# Patient Record
Sex: Male | Born: 1935 | Race: White | Hispanic: No | Smoking: Former smoker
Health system: Southern US, Community
[De-identification: ages and names within clinical notes are randomized; demographics above are authoritative.]

## PROBLEM LIST (undated history)

## (undated) DIAGNOSIS — K219 Gastro-esophageal reflux disease without esophagitis: Secondary | ICD-10-CM

## (undated) DIAGNOSIS — C3490 Malignant neoplasm of unspecified part of unspecified bronchus or lung: Secondary | ICD-10-CM

## (undated) DIAGNOSIS — F0390 Unspecified dementia without behavioral disturbance: Secondary | ICD-10-CM

## (undated) DIAGNOSIS — C801 Malignant (primary) neoplasm, unspecified: Secondary | ICD-10-CM

## (undated) HISTORY — DX: Gastro-esophageal reflux disease without esophagitis: K21.9

## (undated) HISTORY — PX: LUNG SURGERY: SHX703

## (undated) HISTORY — PX: OTHER SURGICAL HISTORY: SHX169

## (undated) HISTORY — PX: CHOLECYSTECTOMY: SHX55

---

## 2016-01-31 DIAGNOSIS — J208 Acute bronchitis due to other specified organisms: Secondary | ICD-10-CM | POA: Insufficient documentation

## 2016-02-05 DIAGNOSIS — R1312 Dysphagia, oropharyngeal phase: Secondary | ICD-10-CM | POA: Insufficient documentation

## 2016-02-05 DIAGNOSIS — K224 Dyskinesia of esophagus: Secondary | ICD-10-CM | POA: Insufficient documentation

## 2019-05-25 ENCOUNTER — Encounter: Payer: Self-pay | Admitting: Emergency Medicine

## 2019-05-25 ENCOUNTER — Other Ambulatory Visit: Payer: Self-pay

## 2019-05-25 ENCOUNTER — Ambulatory Visit
Admission: EM | Admit: 2019-05-25 | Discharge: 2019-05-25 | Disposition: A | Discharge status: Home / Self Care | Payer: Medicare Other | Attending: Family Medicine | Admitting: Family Medicine

## 2019-05-25 DIAGNOSIS — R21 Rash and other nonspecific skin eruption: Secondary | ICD-10-CM

## 2019-05-25 DIAGNOSIS — R600 Localized edema: Secondary | ICD-10-CM

## 2019-05-25 HISTORY — DX: Malignant (primary) neoplasm, unspecified: C80.1

## 2019-05-25 MED ORDER — BETAMETHASONE DIPROPIONATE 0.05 % EX OINT: TOPICAL_OINTMENT | Freq: Two times a day (BID) | CUTANEOUS | 0 refills | Status: DC

## 2019-05-25 NOTE — ED Provider Notes (Signed)
[facility]    CSN: [id number] Arrival date & time: 05/25/19  1233      History   Chief Complaint Chief Complaint  Patient presents with  . Rash  . Leg Swelling    bilateral   HPI  86 year old male presents with rash.  Patient reports a 7 to 9-day history of rash on his lower extremities.  His daughter is concerned that he has a contact dermatitis or chemical burn.  She states that he recently used some shampoo and conditioner and used it on his legs as you would lotion.  He left it on for hours.  Daughter feels like that this is the inciting factor.  He has had lower extremity edema, erythema, and rash.  Patient states that it is quite itchy and also burns.  He rates his pain as 5/10 in severity.  He has had some improvement with over-the-counter hydrocortisone.  He has also been applying Eucerin with some improvement.  No other associated symptoms.  No other complaints.  PMH: BPH (benign prostatic hyperplasia)  treated at [facility] hosp  GERD (gastroesophageal reflux disease)    Allergic state    Anemia    COPD (chronic obstructive pulmonary disease) (CMS-HCC)    Back pain  treated with gabapentin-[facility] with [facility]gen  Hydrocele  left testiclar-had repair  HOH (hard of hearing)  wears hearing aid in right ear    Surgical Hx: CHOLECYSTECTOMY      ARTHROSCOPIC ROTATOR CUFF REPAIR  Left left rotator cuff   INGUINAL HERNIA REPAIR      CATARACT EXTRACTION   both eyes   ear surgery       Home Medications    Prior to Admission medications   Medication Sig Start Date End Date Taking? Authorizing Provider  budesonide-formoterol (SYMBICORT) 160-4.5 MCG/ACT inhaler Inhale into the lungs.   Yes [provider]  finasteride (PROSCAR) 5 MG tablet Take by mouth.   Yes [provider]  gabapentin (NEURONTIN) 300 MG capsule Take by mouth.   Yes [provider]  omeprazole (PRILOSEC) 20 MG capsule Take by mouth.    Yes [provider]  tamsulosin (FLOMAX) 0.4 MG CAPS capsule Take 0.4 mg by mouth.   Yes [provider]  betamethasone dipropionate (DIPROLENE) 0.05 % ointment Apply topically 2 (two) times daily. Do not use for more than 2 weeks consecutively. 05/25/19   Coral Spikes, DO   Social History Social History   Tobacco Use  . Smoking status: Former Research scientist (life sciences)  . Smokeless tobacco: Never Used  Substance Use Topics  . Alcohol use: Never  . Drug use: Never     Allergies   Patient has no known allergies.   Review of Systems Review of Systems  Constitutional: Negative.   Skin: Positive for rash.   Physical Exam Triage Vital Signs ED Triage Vitals  Enc Vitals Group     BP 05/25/19 1305 (!) 154/86     Pulse Rate 05/25/19 1305 89     Resp 05/25/19 1305 16     Temp 05/25/19 1305 98 F (36.7 C)     Temp Source 05/25/19 1305 Oral     SpO2 05/25/19 1305 98 %     Weight 05/25/19 1255 165 lb (74.8 kg)     Height 05/25/19 1255 5\' 10"  (1.778 m)     Head Circumference --      Peak Flow --      Pain Score 05/25/19 1255 5  Pain Loc --      Pain Edu? --      Excl. in [place]? --    Updated Vital Signs BP (!) 154/86 (BP Location: Right Arm)   Pulse 89   Temp 98 F (36.7 C) (Oral)   Resp 16   Ht 5\' 10"  (1.778 m)   Wt 74.8 kg   SpO2 98%   BMI 23.68 kg/m   Visual Acuity Right Eye Distance:   Left Eye Distance:   Bilateral Distance:    Right Eye Near:   Left Eye Near:    Bilateral Near:     Physical Exam Vitals and nursing note reviewed.  Constitutional:      General: He is not in acute distress.    Appearance: Normal appearance. He is not ill-appearing.  HENT:     Head: Normocephalic and atraumatic.  Eyes:     General:        Right eye: No discharge.        Left eye: No discharge.     Conjunctiva/sclera: Conjunctivae normal.  Pulmonary:     Effort: Pulmonary effort is normal. No respiratory distress.  Skin:    Comments: Bilateral lower extremities with  1+ edema.  Dryness and erythema noted.  He has areas of excoriation.  Neurological:     Mental Status: He is alert.  Psychiatric:        Mood and Affect: Mood normal.        Behavior: Behavior normal.    UC Treatments / Results  Labs (all labs ordered are listed, but only abnormal results are displayed) Labs Reviewed - No data to display  EKG   Radiology No results found.  Procedures Procedures (including critical care time)  Medications Ordered in UC Medications - No data to display  Initial Impression / Assessment and Plan / UC Course  I have reviewed the triage vital signs and the nursing notes.  Pertinent labs & imaging results that were available during my care of the patient were reviewed by me and considered in my medical decision making (see chart for details).    86 year old male presents with rash.  Contact dermatitis versus chemical reaction.  Treating with betamethasone.  Advised elevation of his legs.  It is apparent the patient has underlying venous insufficiency as well.  Final Clinical Impressions(s) / UC Diagnoses   Final diagnoses:  Rash and nonspecific skin eruption  Lower extremity edema     Discharge Instructions     Elevate legs.  Steroid as prescribed.  Take care  Dr. Lacinda Axon    ED Prescriptions    Medication Sig Dispense Auth. Provider   betamethasone dipropionate (DIPROLENE) 0.05 % ointment Apply topically 2 (two) times daily. Do not use for more than 2 weeks consecutively. 50 g Coral Spikes, DO     PDMP not reviewed this encounter.   Coral Spikes, [place] 05/25/19 1657

## 2019-05-25 NOTE — ED Triage Notes (Signed)
Patient and daughter states that he has had red itchy rash on his legs and swelling in his legs that started 9 days ago.  Patient thinks it is from the weeds and grass.  Daughter thinks it might be from the shampoo.

## 2019-05-25 NOTE — Discharge Instructions (Signed)
Elevate legs.  Steroid as prescribed.  Take care  Dr. Lacinda Axon

## 2019-06-01 ENCOUNTER — Other Ambulatory Visit: Payer: Self-pay

## 2019-06-01 ENCOUNTER — Ambulatory Visit
Admission: EM | Admit: 2019-06-01 | Discharge: 2019-06-01 | Disposition: A | Discharge status: Home / Self Care | Payer: Medicare Other | Attending: Family Medicine | Admitting: Family Medicine

## 2019-06-01 ENCOUNTER — Encounter: Payer: Self-pay | Admitting: Emergency Medicine

## 2019-06-01 DIAGNOSIS — R21 Rash and other nonspecific skin eruption: Secondary | ICD-10-CM

## 2019-06-01 HISTORY — DX: Unspecified dementia, unspecified severity, without behavioral disturbance, psychotic disturbance, mood disturbance, and anxiety: F03.90

## 2019-06-01 HISTORY — DX: Malignant neoplasm of unspecified part of unspecified bronchus or lung: C34.90

## 2019-06-01 MED ORDER — PREDNISONE 10 MG PO TABS: ORAL_TABLET | ORAL | 0 refills | Status: DC

## 2019-06-01 NOTE — Discharge Instructions (Addendum)
Medication as directed.  See Dermatology. You are welcome to try the [place] but it may take some time. I recommend [facility] dermatology - [phone].  Best of luck.  Dr. Lacinda Axon

## 2019-06-01 NOTE — ED Triage Notes (Signed)
Patient in today with his son who states that he noticed a rash on his back x 3 days. Patient has used Betamethasone and hydrocortisone.

## 2019-06-01 NOTE — ED Provider Notes (Signed)
[facility]    CSN: [id number] Arrival date & time: 06/01/19  1415      History   Chief Complaint Chief Complaint  Patient presents with  . Rash   HPI  86 year old male presents with persistent and worsening rash.  Patient recently seen on 2/27.  At that time had papular rash on his lower extremities.  Has not had resolution with topical betamethasone.  Rash has now spread.  Now is on the back as well.  Diffuse.  Very itchy.  Burns.  Patient is very displeased with the fact that his rash has not resolved.  No known inciting factor.  No new exposures.  No other complaints at this time.  Past Medical History:  Diagnosis Date  . Cancer ([place])   . Dementia ([place])   . Lung cancer [facility])    Past Surgical History:  Procedure Laterality Date  . CHOLECYSTECTOMY    . LUNG SURGERY Left   . rotator cuff surgery     Home Medications    Prior to Admission medications   Medication Sig Start Date End Date Taking? Authorizing Provider  budesonide-formoterol (SYMBICORT) 160-4.5 MCG/ACT inhaler Inhale into the lungs.   Yes [provider]  finasteride (PROSCAR) 5 MG tablet Take by mouth.   Yes [provider]  gabapentin (NEURONTIN) 300 MG capsule Take by mouth.   Yes [provider]  omeprazole (PRILOSEC) 20 MG capsule Take by mouth.   Yes [provider]  tamsulosin (FLOMAX) 0.4 MG CAPS capsule Take 0.4 mg by mouth.   Yes [provider]  VITAMIN D, CHOLECALCIFEROL, PO Take 1 tablet by mouth daily.   Yes [provider]  predniSONE (DELTASONE) 10 MG tablet 50 mg daily x 2 days, then 40 mg daily x 2 days, then 30 mg daily x 2 days, then 20 mg daily x 2 days, then 10 mg daily x 2 days. 06/01/19   Coral Spikes, DO    Family History Family History  Family history unknown: Yes    Social History Social History   Tobacco Use  . Smoking status: Former Smoker    Quit date: 06/01/1979    Years since quitting: 40.0  . Smokeless  tobacco: Never Used  Substance Use Topics  . Alcohol use: Never  . Drug use: Never     Allergies   Patient has no known allergies.   Review of Systems Review of Systems  Constitutional: Negative.   Skin: Positive for rash.   Physical Exam Triage Vital Signs ED Triage Vitals  Enc Vitals Group     BP 06/01/19 1428 131/75     Pulse Rate 06/01/19 1428 87     Resp 06/01/19 1428 18     Temp 06/01/19 1428 98.4 F (36.9 C)     Temp Source 06/01/19 1428 Oral     SpO2 06/01/19 1428 96 %     Weight 06/01/19 1430 165 lb (74.8 kg)     Height 06/01/19 1430 5\' 10"  (1.778 m)     Head Circumference --      Peak Flow --      Pain Score 06/01/19 1429 0     Pain Loc --      Pain Edu? --      Excl. in [place]? --    Updated Vital Signs BP 131/75 (BP Location: Left Arm)   Pulse 87   Temp 98.4 F (36.9 C) (Oral)   Resp 18   Ht 5\' 10"  (  1.778 m)   Wt 74.8 kg   SpO2 96%   BMI 23.68 kg/m   Visual Acuity Right Eye Distance:   Left Eye Distance:   Bilateral Distance:    Right Eye Near:   Left Eye Near:    Bilateral Near:     Physical Exam Vitals and nursing note reviewed.  Constitutional:      General: He is not in acute distress.    Appearance: Normal appearance.  HENT:     Head: Normocephalic and atraumatic.  Eyes:     General:        Right eye: No discharge.        Left eye: No discharge.     Conjunctiva/sclera: Conjunctivae normal.  Cardiovascular:     Rate and Rhythm: Rhythm irregularly irregular.  Pulmonary:     Effort: Pulmonary effort is normal. No respiratory distress.  Skin:    Comments: Lower extremities with erythematous, papular rash.  Back with diffuse, erythematous papular rash.  Some lesions have eschar.  Evidence of excoriation.  Neurological:     Mental Status: He is alert.  Psychiatric:        Mood and Affect: Mood normal.        Behavior: Behavior normal.     UC Treatments / Results  Labs (all labs ordered are listed, but only abnormal results  are displayed) Labs Reviewed - No data to display  EKG   Radiology No results found.  Procedures Procedures (including critical care time)  Medications Ordered in UC Medications - No data to display  Initial Impression / Assessment and Plan / UC Course  I have reviewed the triage vital signs and the nursing notes.  Pertinent labs & imaging results that were available during my care of the patient were reviewed by me and considered in my medical decision making (see chart for details).    86 year old male presents with persistent and worsening rash.  Etiology uncertain.  Does not appear infectious.  Placing empirically on prednisone.  Advised to see dermatology.  May need biopsy.  Supportive care.  Final Clinical Impressions(s) / UC Diagnoses   Final diagnoses:  Rash     Discharge Instructions     Medication as directed.  See Dermatology. You are welcome to try the [place] but it may take some time. I recommend [facility] dermatology - [phone].  Best of luck.  Dr. Lacinda Axon    ED Prescriptions    Medication Sig Dispense Auth. Provider   predniSONE (DELTASONE) 10 MG tablet 50 mg daily x 2 days, then 40 mg daily x 2 days, then 30 mg daily x 2 days, then 20 mg daily x 2 days, then 10 mg daily x 2 days. 30 tablet Coral Spikes, DO     PDMP not reviewed this encounter.   Coral Spikes, [place] 06/01/19 1506

## 2019-07-10 ENCOUNTER — Emergency Department: Payer: Medicare Other

## 2019-07-10 ENCOUNTER — Encounter: Payer: Self-pay | Admitting: Emergency Medicine

## 2019-07-10 ENCOUNTER — Other Ambulatory Visit: Payer: Self-pay

## 2019-07-10 DIAGNOSIS — R0989 Other specified symptoms and signs involving the circulatory and respiratory systems: Secondary | ICD-10-CM | POA: Insufficient documentation

## 2019-07-10 DIAGNOSIS — Z5321 Procedure and treatment not carried out due to patient leaving prior to being seen by health care provider: Secondary | ICD-10-CM | POA: Diagnosis not present

## 2019-07-10 NOTE — ED Triage Notes (Signed)
Patient ambulatory to triage with steady gait, without difficulty or distress noted, mask in place; family reports pt choked on chicken sandwich tonight; st hx aspiration; able to eat & drink since but cont to clear throat

## 2019-07-11 ENCOUNTER — Emergency Department
Admission: EM | Admit: 2019-07-11 | Discharge: 2019-07-11 | Disposition: A | Discharge status: Home / Self Care | Payer: Medicare Other | Attending: Emergency Medicine | Admitting: Emergency Medicine

## 2019-07-11 NOTE — ED Notes (Signed)
Pt daughter to STAT desk asking about wait time. States she wants to take her dad home so he can lie down. Encouraged to stay. Verbalized understanding the benefit of staying to be seen.

## 2019-07-11 NOTE — ED Notes (Signed)
Spoke with pt daughter regarding pt wait time. Pt is unable to sit for long periods of time due to pain to his tailbone per his daughter. Pt offered a recliner chair but pt and family decline and states he needs to lie down on his side and can not sit at all. They inform this nurse that he needs to go home and lie down. Verbalized understanding of risks of leaving. Agreed to call PCP in the morning.

## 2019-11-06 DIAGNOSIS — Z0289 Encounter for other administrative examinations: Secondary | ICD-10-CM | POA: Insufficient documentation

## 2019-11-23 ENCOUNTER — Ambulatory Visit
Admission: EM | Admit: 2019-11-23 | Discharge: 2019-11-23 | Disposition: A | Discharge status: Home / Self Care | Payer: Medicare Other | Attending: Internal Medicine | Admitting: Internal Medicine

## 2019-11-23 ENCOUNTER — Other Ambulatory Visit: Payer: Self-pay

## 2019-11-23 DIAGNOSIS — R531 Weakness: Secondary | ICD-10-CM

## 2019-11-23 LAB — URINALYSIS, COMPLETE (UACMP) WITH MICROSCOPIC
Bacteria, UA: NONE SEEN
Glucose, UA: NEGATIVE mg/dL
Ketones, ur: 15 mg/dL — AB
Leukocytes,Ua: NEGATIVE
Nitrite: NEGATIVE
Protein, ur: NEGATIVE mg/dL
Specific Gravity, Urine: 1.02 (ref 1.005–1.030)
WBC, UA: NONE SEEN WBC/hpf (ref 0–5)
pH: 5 (ref 5.0–8.0)

## 2019-11-23 LAB — BASIC METABOLIC PANEL
Anion gap: 9 (ref 5–15)
BUN: 20 mg/dL (ref 8–23)
CO2: 24 mmol/L (ref 22–32)
Calcium: 9.2 mg/dL (ref 8.9–10.3)
Chloride: 102 mmol/L (ref 98–111)
Creatinine, Ser: 1.39 mg/dL — ABNORMAL HIGH (ref 0.61–1.24)
GFR calc Af Amer: 52 mL/min — ABNORMAL LOW (ref >60)
GFR calc non Af Amer: 45 mL/min — ABNORMAL LOW (ref >60)
Glucose, Bld: 121 mg/dL — ABNORMAL HIGH (ref 70–99)
Potassium: 4.1 mmol/L (ref 3.5–5.1)
Sodium: 135 mmol/L (ref 135–145)

## 2019-11-23 NOTE — ED Triage Notes (Addendum)
Patient here today w/ c/o fatigue, B/A, and dehydration. Patient's daughter states patient has dementia. Patient is under pain management as well. Sx onset x 2 wks. Patient also c/o chronic tailbone problem that is being addressed by his PCP and Pain Management.   Patient has hx of COVID in 02/2019.

## 2019-11-23 NOTE — ED Provider Notes (Signed)
[facility]    CSN: [id number] Arrival date & time: 11/23/19  1048      History   Chief Complaint Chief Complaint  Patient presents with  . Fatigue  . Generalized Body Aches  . Dehydration  . Anxiety    HPI Timothy Noble is a 86 y.o. male is brought to the urgent care accompanied by his daughter on account of 1 day history of generalized weakness.  As per the patient's daughter, patient has Alzheimer's dementia and lives independently.  They check on him in the evenings.  She could not ascertain if he has been drinking enough fluids during the day.  No vomiting or diarrhea.  No fever or chills.  Patient has chronic low back pain which is being managed by pain management team.  No cough or sputum production.Marland Kitchen   HPI  Past Medical History:  Diagnosis Date  . Cancer ([place])   . Dementia ([place])   . Lung cancer ([place])     There are no problems to display for this patient.   Past Surgical History:  Procedure Laterality Date  . CHOLECYSTECTOMY    . LUNG SURGERY Left   . rotator cuff surgery         Home Medications    Prior to Admission medications   Medication Sig Start Date End Date Taking? Authorizing Provider  budesonide-formoterol (SYMBICORT) 160-4.5 MCG/ACT inhaler Inhale into the lungs.   Yes [provider]  finasteride (PROSCAR) 5 MG tablet Take by mouth.   Yes [provider]  gabapentin (NEURONTIN) 300 MG capsule Take by mouth.   Yes [provider]  omeprazole (PRILOSEC) 20 MG capsule Take by mouth.   Yes [provider]  predniSONE (DELTASONE) 10 MG tablet 50 mg daily x 2 days, then 40 mg daily x 2 days, then 30 mg daily x 2 days, then 20 mg daily x 2 days, then 10 mg daily x 2 days. 06/01/19  Yes Cook, Jayce G, DO  tamsulosin (FLOMAX) 0.4 MG CAPS capsule Take 0.4 mg by mouth.   Yes [provider]  VITAMIN D, CHOLECALCIFEROL, PO Take 1 tablet by mouth daily.   Yes [provider]  acetaminophen  (TYLENOL) 500 MG tablet Take by mouth.    [provider]  buprenorphine (BUTRANS) 10 MCG/HR PTWK 1 patch once a week. 11/06/19   [provider]  cetirizine (ZYRTEC) 10 MG tablet Take by mouth.    [provider]    Family History Family History  Family history unknown: Yes    Social History Social History   Tobacco Use  . Smoking status: Former Smoker    Quit date: 06/01/1979    Years since quitting: 40.5  . Smokeless tobacco: Never Used  Vaping Use  . Vaping Use: Never used  Substance Use Topics  . Alcohol use: Never  . Drug use: Never     Allergies   Patient has no known allergies.   Review of Systems Review of Systems  Unable to perform ROS: Dementia     Physical Exam Triage Vital Signs ED Triage Vitals  Enc Vitals Group     BP 11/23/19 1141 130/90     Pulse Rate 11/23/19 1141 89     Resp 11/23/19 1141 20     Temp 11/23/19 1141 97.9 F (36.6 C)     Temp Source 11/23/19 1141 Oral     SpO2 11/23/19 1141 98 %     Weight 11/23/19 1144 160 lb (  72.6 kg)     Height 11/23/19 1144 5\' 9"  (1.753 m)     Head Circumference --      Peak Flow --      Pain Score 11/23/19 1143 4     Pain Loc --      Pain Edu? --      Excl. in [place]? --    No data found.  Updated Vital Signs BP 130/90 (BP Location: Right Arm)   Pulse 89   Temp 97.9 F (36.6 C) (Oral)   Resp 20   Ht 5\' 9"  (1.753 m)   Wt 72.6 kg   SpO2 98%   BMI 23.63 kg/m   Visual Acuity Right Eye Distance:   Left Eye Distance:   Bilateral Distance:    Right Eye Near:   Left Eye Near:    Bilateral Near:     Physical Exam Vitals and nursing note reviewed.  Constitutional:      Appearance: He is not ill-appearing.  Cardiovascular:     Rate and Rhythm: Regular rhythm.     Pulses: Normal pulses.     Heart sounds: Normal heart sounds.  Pulmonary:     Effort: Pulmonary effort is normal.     Breath sounds: Normal breath sounds.  Musculoskeletal:        General: Normal range of  motion.  Skin:    Capillary Refill: Capillary refill takes less than 2 seconds.  Neurological:     Mental Status: He is alert.      UC Treatments / Results  Labs (all labs ordered are listed, but only abnormal results are displayed) Labs Reviewed  URINALYSIS, COMPLETE (UACMP) WITH MICROSCOPIC - Abnormal; Notable for the following components:      Result Value   Hgb urine dipstick TRACE (*)    Bilirubin Urine SMALL (*)    Ketones, ur 15 (*)    All other components within normal limits  BASIC METABOLIC PANEL    EKG   Radiology No results found.  Procedures Procedures (including critical care time)  Medications Ordered in UC Medications - No data to display  Initial Impression / Assessment and Plan / UC Course  I have reviewed the triage vital signs and the nursing notes.  Pertinent labs & imaging results that were available during my care of the patient were reviewed by me and considered in my medical decision making (see chart for details).     1.  Generalized weakness: Urinalysis is negative for UTI Specific gravity is 1.020 BMP ordered Patient is advised to increase oral fluid intake The patient's family may need to support him with strategies that will help maintain his hydration. Return precautions given If patient symptoms worsens he needs to be taken to the emergency department for further evaluation. Final Clinical Impressions(s) / UC Diagnoses   Final diagnoses:  Weakness     Discharge Instructions     Please encourage Derrious to drink more fluids If his blood work is significant and requires a change in his plan of care-we will call you with the results I would encourage you to sign up for my chart to review the results Return to urgent care if his symptoms worsen.   ED Prescriptions    None     PDMP not reviewed this encounter.   Chase Picket, MD 11/23/19 1247

## 2019-11-23 NOTE — Discharge Instructions (Signed)
Please encourage Timothy Noble to drink more fluids If his blood work is significant and requires a change in his plan of care-we will call you with the results I would encourage you to sign up for my chart to review the results Return to urgent care if his symptoms worsen.

## 2020-02-17 ENCOUNTER — Encounter: Payer: Self-pay | Admitting: Podiatry

## 2020-02-17 ENCOUNTER — Other Ambulatory Visit: Payer: Self-pay

## 2020-02-17 ENCOUNTER — Ambulatory Visit (INDEPENDENT_AMBULATORY_CARE_PROVIDER_SITE_OTHER): Payer: Medicare Other | Admitting: Podiatry

## 2020-02-17 DIAGNOSIS — N433 Hydrocele, unspecified: Secondary | ICD-10-CM | POA: Insufficient documentation

## 2020-02-17 DIAGNOSIS — N400 Enlarged prostate without lower urinary tract symptoms: Secondary | ICD-10-CM | POA: Insufficient documentation

## 2020-02-17 DIAGNOSIS — D649 Anemia, unspecified: Secondary | ICD-10-CM | POA: Insufficient documentation

## 2020-02-17 DIAGNOSIS — J432 Centrilobular emphysema: Secondary | ICD-10-CM | POA: Insufficient documentation

## 2020-02-17 DIAGNOSIS — T7840XA Allergy, unspecified, initial encounter: Secondary | ICD-10-CM | POA: Insufficient documentation

## 2020-02-17 DIAGNOSIS — J449 Chronic obstructive pulmonary disease, unspecified: Secondary | ICD-10-CM | POA: Insufficient documentation

## 2020-02-17 DIAGNOSIS — M79676 Pain in unspecified toe(s): Secondary | ICD-10-CM | POA: Diagnosis not present

## 2020-02-17 DIAGNOSIS — B351 Tinea unguium: Secondary | ICD-10-CM

## 2020-02-17 DIAGNOSIS — M549 Dorsalgia, unspecified: Secondary | ICD-10-CM | POA: Insufficient documentation

## 2020-02-17 DIAGNOSIS — H9190 Unspecified hearing loss, unspecified ear: Secondary | ICD-10-CM | POA: Insufficient documentation

## 2020-02-17 NOTE — Progress Notes (Signed)
  Subjective:  Patient ID: Timothy Noble, male    DOB: 11/04/1930,  MRN: [id number] HPI Chief Complaint  Patient presents with  . Debridement    Requesting nail care  . New Patient (Initial Visit)    86 y.o. male presents with the above complaint.   ROS: Denies fever chills nausea vomiting muscle aches pains calf pain back pain chest pain shortness of breath.  Past Medical History:  Diagnosis Date  . Cancer ([place])   . Dementia ([place])   . Lung cancer [facility])    Past Surgical History:  Procedure Laterality Date  . CHOLECYSTECTOMY    . LUNG SURGERY Left   . rotator cuff surgery      Current Outpatient Medications:  .  diclofenac Sodium (VOLTAREN) 1 % GEL, Apply topically., Disp: , Rfl:  .  acetaminophen (TYLENOL) 500 MG tablet, Take by mouth., Disp: , Rfl:  .  budesonide-formoterol (SYMBICORT) 160-4.5 MCG/ACT inhaler, Inhale into the lungs., Disp: , Rfl:  .  buprenorphine (BUTRANS) 5 MCG/HR PTWK, 1 patch once a week., Disp: , Rfl:  .  calcium-vitamin D (OSCAL WITH D) 500-200 MG-UNIT TABS tablet, Take by mouth., Disp: , Rfl:  .  cetirizine (ZYRTEC) 10 MG tablet, Take by mouth., Disp: , Rfl:  .  finasteride (PROSCAR) 5 MG tablet, Take by mouth., Disp: , Rfl:  .  fluticasone (FLONASE) 50 MCG/ACT nasal spray, 2 sprays by Each Nare route two (2) times a day as needed for rhinitis., Disp: , Rfl:  .  gabapentin (NEURONTIN) 300 MG capsule, Take by mouth., Disp: , Rfl:  .  omeprazole (PRILOSEC) 20 MG capsule, Take by mouth., Disp: , Rfl:  .  tamsulosin (FLOMAX) 0.4 MG CAPS capsule, Take 0.4 mg by mouth., Disp: , Rfl:  .  VITAMIN D, CHOLECALCIFEROL, PO, Take 1 tablet by mouth daily., Disp: , Rfl:   No Known Allergies Review of Systems Objective:  There were no vitals filed for this visit.  General: Well developed, nourished, in no acute distress, alert and oriented x3   Dermatological: Skin is warm, dry and supple bilateral. Nails x 10 are well maintained with exception of the hallux  bilaterally and the second digit left.  These nails appear to be thick dystrophic clinically mycotic.; remaining integument appears unremarkable at this time. There are no open sores, no preulcerative lesions, no rash or signs of infection present.  Vascular: Dorsalis Pedis artery and Posterior Tibial artery pedal pulses are 2/4 bilateral with immedate capillary fill time. Pedal hair growth present. No varicosities and no lower extremity edema present bilateral.   Neruologic: Grossly intact via light touch bilateral. Vibratory intact via tuning fork bilateral. Protective threshold with Semmes Wienstein monofilament intact to all pedal sites bilateral. Patellar and Achilles deep tendon reflexes 2+ bilateral. No Babinski or clonus noted bilateral.   Musculoskeletal: No gross boney pedal deformities bilateral. No pain, crepitus, or limitation noted with foot and ankle range of motion bilateral. Muscular strength 5/5 in all groups tested bilateral.  Gait: Unassisted, Nonantalgic.    Radiographs:  None taken  Assessment & Plan:   Assessment: Pain in limb secondary to onychomycosis.  Plan: Debridement of toenails 1 through 5 bilateral.  He will follow up with Dr. Prudence Davidson next visit     Garry Bochicchio T. [place], [place]

## 2020-02-24 ENCOUNTER — Ambulatory Visit: Payer: Self-pay

## 2020-06-16 ENCOUNTER — Ambulatory Visit (INDEPENDENT_AMBULATORY_CARE_PROVIDER_SITE_OTHER)
Admit: 2020-06-16 | Discharge: 2020-06-16 | Disposition: A | Discharge status: Home / Self Care | Payer: No Typology Code available for payment source | Attending: Emergency Medicine | Admitting: Emergency Medicine

## 2020-06-16 ENCOUNTER — Other Ambulatory Visit: Payer: Self-pay

## 2020-06-16 ENCOUNTER — Ambulatory Visit
Admission: EM | Admit: 2020-06-16 | Discharge: 2020-06-16 | Disposition: A | Discharge status: Home / Self Care | Payer: No Typology Code available for payment source | Attending: Emergency Medicine | Admitting: Emergency Medicine

## 2020-06-16 DIAGNOSIS — R300 Dysuria: Secondary | ICD-10-CM | POA: Diagnosis not present

## 2020-06-16 DIAGNOSIS — R109 Unspecified abdominal pain: Secondary | ICD-10-CM | POA: Diagnosis present

## 2020-06-16 DIAGNOSIS — R140 Abdominal distension (gaseous): Secondary | ICD-10-CM | POA: Diagnosis not present

## 2020-06-16 DIAGNOSIS — G309 Alzheimer's disease, unspecified: Secondary | ICD-10-CM | POA: Diagnosis not present

## 2020-06-16 DIAGNOSIS — R1084 Generalized abdominal pain: Secondary | ICD-10-CM | POA: Diagnosis not present

## 2020-06-16 LAB — CBC WITH DIFFERENTIAL/PLATELET
Abs Immature Granulocytes: 0.03 10*3/uL (ref 0.00–0.07)
Basophils Absolute: 0.1 10*3/uL (ref 0.0–0.1)
Basophils Relative: 1 %
Eosinophils Absolute: 0.6 10*3/uL — ABNORMAL HIGH (ref 0.0–0.5)
Eosinophils Relative: 6 %
HCT: 40.1 % (ref 39.0–52.0)
Hemoglobin: 13.5 g/dL (ref 13.0–17.0)
Immature Granulocytes: 0 %
Lymphocytes Relative: 20 %
Lymphs Abs: 1.9 10*3/uL (ref 0.7–4.0)
MCH: 31 pg (ref 26.0–34.0)
MCHC: 33.7 g/dL (ref 30.0–36.0)
MCV: 92.2 fL (ref 80.0–100.0)
Monocytes Absolute: 0.8 10*3/uL (ref 0.1–1.0)
Monocytes Relative: 8 %
Neutro Abs: 6.3 10*3/uL (ref 1.7–7.7)
Neutrophils Relative %: 65 %
Platelets: 181 10*3/uL (ref 150–400)
RBC: 4.35 MIL/uL (ref 4.22–5.81)
RDW: 12.7 % (ref 11.5–15.5)
WBC: 9.7 10*3/uL (ref 4.0–10.5)
nRBC: 0 % (ref 0.0–0.2)

## 2020-06-16 LAB — URINALYSIS, COMPLETE (UACMP) WITH MICROSCOPIC
Bacteria, UA: NONE SEEN
Bilirubin Urine: NEGATIVE
Glucose, UA: NEGATIVE mg/dL
Hgb urine dipstick: NEGATIVE
Leukocytes,Ua: NEGATIVE
Nitrite: NEGATIVE
Protein, ur: NEGATIVE mg/dL
RBC / HPF: NONE SEEN RBC/hpf (ref 0–5)
Specific Gravity, Urine: 1.02 (ref 1.005–1.030)
Squamous Epithelial / HPF: NONE SEEN (ref 0–5)
pH: 5.5 (ref 5.0–8.0)

## 2020-06-16 LAB — COMPREHENSIVE METABOLIC PANEL
ALT: 13 U/L (ref 0–44)
AST: 18 U/L (ref 15–41)
Albumin: 4.4 g/dL (ref 3.5–5.0)
Alkaline Phosphatase: 32 U/L — ABNORMAL LOW (ref 38–126)
Anion gap: 7 (ref 5–15)
BUN: 19 mg/dL (ref 8–23)
CO2: 24 mmol/L (ref 22–32)
Calcium: 9.5 mg/dL (ref 8.9–10.3)
Chloride: 106 mmol/L (ref 98–111)
Creatinine, Ser: 1.15 mg/dL (ref 0.61–1.24)
GFR, Estimated: >60 mL/min (ref >60)
Glucose, Bld: 96 mg/dL (ref 70–99)
Potassium: 4 mmol/L (ref 3.5–5.1)
Sodium: 137 mmol/L (ref 135–145)
Total Bilirubin: 1 mg/dL (ref 0.3–1.2)
Total Protein: 7.3 g/dL (ref 6.5–8.1)

## 2020-06-16 NOTE — ED Provider Notes (Signed)
[facility]    CSN: [id number] Arrival date & time: 06/16/20  1644      History   Chief Complaint Chief Complaint  Patient presents with  . Abdominal Pain    HPI Timothy Noble is a 87 y.o. male.   HPI   87 year old male here for evaluation of abdominal pain.  Patient is here with his daughter for evaluation of abdominal pain.  Patient has Alzheimer's and has a home health aide that comes every day from 10 AM to 6 PM.  Patient stated to the family that when she arrived today at 10 AM the patient was curled up in bed and complaining of abdominal pain.  Patient told caregiver and daughter that the cramping started last night and that he has had some associated nausea.  Today he has had a much decreased appetite.  Patient has not had any fever, vomiting, or diarrhea.  Patient has had some bloating per the daughter and also when asked complains of painful urination.  Patient has a history of BPH and is on Flomax and finasteride for that.  Patient's last BM was today and it was soft.  Daughter is unaware of any blood in his stool.  Past Medical History:  Diagnosis Date  . Cancer ([place])   . Dementia ([place])   . Lung cancer [facility])     Patient Active Problem List   Diagnosis Date Noted  . Allergy 02/17/2020  . Anemia 02/17/2020  . Back pain 02/17/2020  . BPH (benign prostatic hyperplasia) 02/17/2020  . COPD (chronic obstructive pulmonary disease) ([place]) 02/17/2020  . HOH (hard of hearing) 02/17/2020  . Hydrocele 02/17/2020  . Pain management contract agreement 11/06/2019  . Esophageal dysmotility 02/05/2016  . Oropharyngeal dysphagia 02/05/2016  . Acute bronchitis due to other specified organisms 01/31/2016    Past Surgical History:  Procedure Laterality Date  . CHOLECYSTECTOMY    . LUNG SURGERY Left   . rotator cuff surgery         Home Medications    Prior to Admission medications   Medication Sig Start Date End Date Taking? Authorizing Provider  diclofenac  Sodium (VOLTAREN) 1 % GEL APPLY 2 GRAMS TOPICALLY FOUR TIMES A DAY AS DIRECTED 11/25/19  Yes [provider]  guaifenesin (HUMIBID E) 400 MG TABS tablet Take 1 tablet by mouth every 6 (six) hours as needed. 04/16/20  Yes [provider]  lidocaine (LIDODERM) 5 % APPLY 1 PATCH TO SKIN EVERY DAY APPLY ONCE DAILY FOR UP TO 12 HOURS (12 HOURS ON THEN REMOVE FOR 12 HOURS) 03/07/20  Yes [provider]  acetaminophen (TYLENOL) 500 MG tablet Take by mouth.    [provider]  budesonide-formoterol (SYMBICORT) 160-4.5 MCG/ACT inhaler Inhale into the lungs.    [provider]  buprenorphine (BUTRANS) 5 MCG/HR PTWK 1 patch once a week. 11/18/19   [provider]  calcium-vitamin D (OSCAL WITH D) 500-200 MG-UNIT TABS tablet Take by mouth.    [provider]  cetirizine (ZYRTEC) 10 MG tablet Take by mouth.    [provider]  finasteride (PROSCAR) 5 MG tablet Take by mouth.    [provider]  fluticasone (FLONASE) 50 MCG/ACT nasal spray 2 sprays by Each Nare route two (2) times a day as needed for rhinitis.    [provider]  gabapentin (NEURONTIN) 300 MG capsule Take by mouth.    [provider]  omeprazole (PRILOSEC) 20 MG capsule Take by mouth.    [provider]  tamsulosin (FLOMAX) 0.4 MG CAPS capsule Take 0.4 mg by mouth.    [provider]  VITAMIN D, CHOLECALCIFEROL, PO Take 1 tablet by mouth daily.    [provider]    Family History Family History  Family history unknown: Yes    Social History Social History   Tobacco Use  . Smoking status: Former Smoker    Quit date: 06/01/1979    Years since quitting: 41.0  . Smokeless tobacco: Never Used  Vaping Use  . Vaping Use: Never used  Substance Use Topics  . Alcohol use: Never  . Drug use: Never     Allergies   Alendronate sodium   Review of Systems Review of Systems  Constitutional: Positive for appetite  change. Negative for activity change and fever.  Gastrointestinal: Positive for abdominal distention, abdominal pain and nausea. Negative for blood in stool, diarrhea and vomiting.  Genitourinary: Positive for dysuria.  Hematological: Negative.   Psychiatric/Behavioral: Negative.      Physical Exam Triage Vital Signs ED Triage Vitals  Enc Vitals Group     BP 06/16/20 1700 (!) 161/97     Pulse Rate 06/16/20 1700 88     Resp 06/16/20 1700 16     Temp 06/16/20 1700 98.3 F (36.8 C)     Temp Source 06/16/20 1700 Oral     SpO2 06/16/20 1700 97 %     Weight 06/16/20 1653 160 lb (72.6 kg)     Height --      Head Circumference --      Peak Flow --      Pain Score 06/16/20 1651 5     Pain Loc --      Pain Edu? --      Excl. in [place]? --    No data found.  Updated Vital Signs BP (!) 161/97 (BP Location: Right Arm)   Pulse 88   Temp 98.3 F (36.8 C) (Oral)   Resp 16   Wt 160 lb (72.6 kg)   SpO2 97%   BMI 23.63 kg/m   Visual Acuity Right Eye Distance:   Left Eye Distance:   Bilateral Distance:    Right Eye Near:   Left Eye Near:    Bilateral Near:     Physical Exam Vitals and nursing note reviewed.  Constitutional:      General: He is not in acute distress.    Appearance: He is well-developed.  HENT:     Head: Normocephalic and atraumatic.  Cardiovascular:     Rate and Rhythm: Normal rate and regular rhythm.     Heart sounds: Normal heart sounds. No murmur heard. No gallop.   Pulmonary:     Effort: Pulmonary effort is normal.     Breath sounds: Normal breath sounds. No wheezing, rhonchi or rales.  Abdominal:     General: Bowel sounds are decreased. There is distension.     Palpations: Abdomen is rigid. There is no hepatomegaly or splenomegaly.  Skin:    General: Skin is warm and dry.  Neurological:     Mental Status: He is alert. He is disoriented.      UC Treatments / Results  Labs (all labs ordered are listed, but only abnormal results are  displayed) Labs Reviewed  CBC WITH DIFFERENTIAL/PLATELET - Abnormal; Notable for the following components:      Result Value   Eosinophils Absolute 0.6 (*)    All other components within normal limits  COMPREHENSIVE METABOLIC PANEL -  Abnormal; Notable for the following components:   Alkaline Phosphatase 32 (*)    All other components within normal limits  URINALYSIS, COMPLETE (UACMP) WITH MICROSCOPIC - Abnormal; Notable for the following components:   Ketones, ur TRACE (*)    All other components within normal limits    EKG   Radiology CT ABDOMEN PELVIS WO CONTRAST  Result Date: 06/16/2020 CLINICAL DATA:  Abdominal pain and cramping beginning yesterday. EXAM: CT ABDOMEN AND PELVIS WITHOUT CONTRAST TECHNIQUE: Multidetector CT imaging of the abdomen and pelvis was performed following the standard protocol without IV contrast. COMPARISON:  None. FINDINGS: Lower chest: No acute findings. Hepatobiliary: No mass visualized on this unenhanced exam. Prior cholecystectomy. No evidence of biliary obstruction. Pancreas: No mass or inflammatory process visualized on this unenhanced exam. Spleen:  Within normal limits in size. Adrenals/Urinary tract: No evidence of urolithiasis or hydronephrosis. Diffuse bladder wall thickening is seen, likely due to chronic bladder outlet obstruction given enlarged prostate. Stomach/Bowel: No evidence of obstruction, inflammatory process, or abnormal fluid collections. Diverticulosis is seen mainly involving the sigmoid colon, however there is no evidence of diverticulitis. Vascular/Lymphatic: No pathologically enlarged lymph nodes identified. No evidence of abdominal aortic aneurysm. Aortic atherosclerotic calcification noted. Reproductive:  Moderately enlarged prostate. Other:  None. Musculoskeletal:  No suspicious bone lesions identified. IMPRESSION: No evidence of urolithiasis, hydronephrosis, or other acute findings. Moderately enlarged prostate and findings of chronic  bladder outlet obstruction. Sigmoid diverticulosis, without radiographic evidence of diverticulitis. Aortic Atherosclerosis (ICD10-I70.0). Electronically Signed   By: Marlaine Hind M.D.   On: 06/16/2020 18:29    Procedures Procedures (including critical care time)  Medications Ordered in UC Medications - No data to display  Initial Impression / Assessment and Plan / UC Course  I have reviewed the triage vital signs and the nursing notes.  Pertinent labs & imaging results that were available during my care of the patient were reviewed by me and considered in my medical decision making (see chart for details).   Patient is an 87 year old male with a history of Alzheimer's here for evaluation of abdominal pain.  Patient physical exam patient is nontoxic appearing but does look like he does not feel well.  He is complaining of nausea and abdominal pain while rubbing his upper abdomen back and forth.  Patient is cooperative for exam.  Bowel sounds are diminished in all fields.  Abdomen is firm with a palpable knot in the right upper quadrant.  Unable to elicit tenderness, guarding, or rebound with palpation of the remainder of the abdomen.  Will obtain CBC, CMP, UA, and CT scan of the abdomen and pelvis.  CBC is unremarkable.  CMP shows a mildly low alk phos at 32 but is otherwise unremarkable.  CT scan is positive for diverticulosis without any evidence of diverticulitis.  No acute intra-abdominal process identified.  Urinalysis has trace ketones present but is otherwise unremarkable.  We will discharge patient home and have him follow-up with his primary care provider.  I cannot find any reason for his current abdominal pain.  We will have daughter treat pain with Tylenol as needed.  If patient's pain worsens or if he develops a fever patient is to follow-up in the emergency department.     Final Clinical Impressions(s) / UC Diagnoses   Final diagnoses:  Abdominal pain, unspecified  abdominal location     Discharge Instructions     The scan today did not show any attributable cause to your abdominal pain.  Your blood work did  not show any signs of infection and neither did your urine.  There are no signs of constipation on your imaging.  Use over-the-counter Tylenol as needed for pain.  There are no dietary restrictions.  If you develop a fever or if your pain intensifies go to the ER for evaluation.    ED Prescriptions    None     PDMP not reviewed this encounter.   Margarette Canada, NP 06/16/20 1929

## 2020-06-16 NOTE — ED Triage Notes (Addendum)
Patient presents to Urgent Care with complaints of abdominal cramping since last night. Daughter reports no changes in diet or meds. Pt states no hx of GI issues and last BM today.   Denies fever, n/v, diarrhea, or urinary symptoms.

## 2020-06-16 NOTE — Discharge Instructions (Addendum)
The scan today did not show any attributable cause to your abdominal pain.  Your blood work did not show any signs of infection and neither did your urine.  There are no signs of constipation on your imaging.  Use over-the-counter Tylenol as needed for pain.  There are no dietary restrictions.  If you develop a fever or if your pain intensifies go to the ER for evaluation.

## 2020-06-18 ENCOUNTER — Other Ambulatory Visit: Payer: Non-veteran care

## 2020-08-18 ENCOUNTER — Other Ambulatory Visit: Payer: Self-pay

## 2020-08-18 ENCOUNTER — Ambulatory Visit
Admission: EM | Admit: 2020-08-18 | Discharge: 2020-08-18 | Disposition: A | Discharge status: Home / Self Care | Payer: Medicare Other | Attending: Family Medicine | Admitting: Family Medicine

## 2020-08-18 DIAGNOSIS — R82998 Other abnormal findings in urine: Secondary | ICD-10-CM

## 2020-08-18 LAB — URINALYSIS, COMPLETE (UACMP) WITH MICROSCOPIC
Glucose, UA: NEGATIVE mg/dL
Hgb urine dipstick: NEGATIVE
Leukocytes,Ua: NEGATIVE
Nitrite: NEGATIVE
Protein, ur: NEGATIVE mg/dL
Specific Gravity, Urine: 1.025 (ref 1.005–1.030)
pH: 6 (ref 5.0–8.0)

## 2020-08-18 NOTE — ED Triage Notes (Signed)
Pt daughter states his nurse states he needs a urinalysis due to being more aggravated then usual.

## 2020-08-18 NOTE — ED Provider Notes (Signed)
[facility]    CSN: [id number] Arrival date & time: 08/18/20  1522      History   Chief Complaint Urine issue  HPI  87 year old male presents for urine testing.  Patient is accompanied by his daughter.  Daughter reports that he is full-time caregiver told her that he needed to come in and get his urine checked.  Daughter would not tell me exactly the reason behind this.  She states that his urine has been dark and she feels he is dehydrated.  There are no reports of dysuria.  No reports of foul-smelling urine.  He has no fever.  Past Medical History:  Diagnosis Date  . Cancer ([place])   . Dementia ([place])   . Lung cancer [facility])     Patient Active Problem List   Diagnosis Date Noted  . Allergy 02/17/2020  . Anemia 02/17/2020  . Back pain 02/17/2020  . BPH (benign prostatic hyperplasia) 02/17/2020  . COPD (chronic obstructive pulmonary disease) ([place]) 02/17/2020  . HOH (hard of hearing) 02/17/2020  . Hydrocele 02/17/2020  . Pain management contract agreement 11/06/2019  . Esophageal dysmotility 02/05/2016  . Oropharyngeal dysphagia 02/05/2016  . Acute bronchitis due to other specified organisms 01/31/2016    Past Surgical History:  Procedure Laterality Date  . CHOLECYSTECTOMY    . LUNG SURGERY Left   . rotator cuff surgery         Home Medications    Prior to Admission medications   Medication Sig Start Date End Date Taking? Authorizing Provider  acetaminophen (TYLENOL) 500 MG tablet Take by mouth.   Yes [provider]  budesonide-formoterol (SYMBICORT) 160-4.5 MCG/ACT inhaler Inhale into the lungs.   Yes [provider]  buprenorphine (BUTRANS) 5 MCG/HR PTWK 1 patch once a week. 11/18/19  Yes [provider]  calcium-vitamin D (OSCAL WITH D) 500-200 MG-UNIT TABS tablet Take by mouth.   Yes [provider]  cetirizine (ZYRTEC) 10 MG tablet Take by mouth.   Yes [provider]  diclofenac Sodium (VOLTAREN) 1 %  GEL APPLY 2 GRAMS TOPICALLY FOUR TIMES A DAY AS DIRECTED 11/25/19  Yes [provider]  finasteride (PROSCAR) 5 MG tablet Take by mouth.   Yes [provider]  fluticasone (FLONASE) 50 MCG/ACT nasal spray 2 sprays by Each Nare route two (2) times a day as needed for rhinitis.   Yes [provider]  gabapentin (NEURONTIN) 300 MG capsule Take by mouth.   Yes [provider]  guaifenesin (HUMIBID E) 400 MG TABS tablet Take 1 tablet by mouth every 6 (six) hours as needed. 04/16/20  Yes [provider]  lidocaine (LIDODERM) 5 % APPLY 1 PATCH TO SKIN EVERY DAY APPLY ONCE DAILY FOR UP TO 12 HOURS (12 HOURS ON THEN REMOVE FOR 12 HOURS) 03/07/20  Yes [provider]  omeprazole (PRILOSEC) 20 MG capsule Take by mouth.   Yes [provider]  tamsulosin (FLOMAX) 0.4 MG CAPS capsule Take 0.4 mg by mouth.   Yes [provider]  VITAMIN D, CHOLECALCIFEROL, PO Take 1 tablet by mouth daily.   Yes [provider]    Family History Family History  Family history unknown: Yes    Social History Social History   Tobacco Use  . Smoking status: Former Smoker    Quit date: 06/01/1979    Years since quitting: 41.2  . Smokeless tobacco: Never Used  Vaping Use  . Vaping Use: Never used  Substance Use Topics  .  Alcohol use: Never  . Drug use: Never     Allergies   Alendronate sodium   Review of Systems Review of Systems Per HPI  Physical Exam Triage Vital Signs ED Triage Vitals  Enc Vitals Group     BP 08/18/20 1530 (!) 172/89     Pulse Rate 08/18/20 1530 92     Resp 08/18/20 1530 20     Temp --      Temp src --      SpO2 08/18/20 1530 99 %     Weight --      Height --      Head Circumference --      Peak Flow --      Pain Score 08/18/20 1544 0     Pain Loc --      Pain Edu? --      Excl. in [place]? --    Updated Vital Signs BP (!) 172/89 (BP Location: Left Arm)   Pulse 92   Resp 20   SpO2 99%   Visual  Acuity Right Eye Distance:   Left Eye Distance:   Bilateral Distance:    Right Eye Near:   Left Eye Near:    Bilateral Near:     Physical Exam Vitals and nursing note reviewed.  Constitutional:      General: He is not in acute distress. HENT:     Head: Normocephalic and atraumatic.  Eyes:     General:        Right eye: No discharge.        Left eye: No discharge.     Conjunctiva/sclera: Conjunctivae normal.  Cardiovascular:     Rate and Rhythm: Normal rate and regular rhythm.  Pulmonary:     Effort: Pulmonary effort is normal.     Breath sounds: Normal breath sounds. No wheezing, rhonchi or rales.  Abdominal:     General: There is no distension.     Palpations: Abdomen is soft.     Tenderness: There is no abdominal tenderness.  Neurological:     Mental Status: He is alert.    UC Treatments / Results  Labs (all labs ordered are listed, but only abnormal results are displayed) Labs Reviewed  URINALYSIS, COMPLETE (UACMP) WITH MICROSCOPIC - Abnormal; Notable for the following components:      Result Value   Bilirubin Urine SMALL (*)    Ketones, ur TRACE (*)    Bacteria, UA FEW (*)    All other components within normal limits  URINE CULTURE    EKG   Radiology No results found.  Procedures Procedures (including critical care time)  Medications Ordered in UC Medications - No data to display  Initial Impression / Assessment and Plan / UC Course  I have reviewed the triage vital signs and the nursing notes.  Pertinent labs & imaging results that were available during my care of the patient were reviewed by me and considered in my medical decision making (see chart for details).    87 year old male presents for evaluation regarding caregiver concern for UTI and/or dehydration.  His urinalysis is unremarkable today.  Sending culture.  Final Clinical Impressions(s) / UC Diagnoses   Final diagnoses:  Dark urine     Discharge Instructions     I will call  with the results.  Take care  Dr. Lacinda Axon    ED Prescriptions    None     PDMP not reviewed this encounter.   Coral Spikes, DO 08/18/20  1614  

## 2020-08-18 NOTE — Discharge Instructions (Signed)
I will call with the results.  Take care  Dr. Lacinda Axon

## 2020-08-21 LAB — URINE CULTURE: Culture: NO GROWTH

## 2020-09-21 ENCOUNTER — Ambulatory Visit: Payer: Medicare Other | Admitting: Internal Medicine

## 2020-09-30 ENCOUNTER — Ambulatory Visit
Admission: EM | Admit: 2020-09-30 | Discharge: 2020-09-30 | Disposition: A | Discharge status: Home / Self Care | Payer: No Typology Code available for payment source | Attending: Emergency Medicine | Admitting: Emergency Medicine

## 2020-09-30 ENCOUNTER — Encounter: Payer: Self-pay | Admitting: Emergency Medicine

## 2020-09-30 ENCOUNTER — Other Ambulatory Visit: Payer: Self-pay

## 2020-09-30 DIAGNOSIS — H6121 Impacted cerumen, right ear: Secondary | ICD-10-CM | POA: Diagnosis not present

## 2020-09-30 NOTE — ED Triage Notes (Signed)
Pt presents today accompanied by son-in-law. Pt c/o of right ear pain and nasal congestion x 2 days. Denies fever. +Dementia

## 2020-09-30 NOTE — Discharge Instructions (Addendum)
We were able to successfully remove the earwax from the right ear canal.  If there is no improvement in hearing when you get home and put her hearing aids and I recommend making a follow-up appointment with ENT for repeat audiogram.

## 2020-09-30 NOTE — ED Provider Notes (Signed)
[facility]    CSN: [id number] Arrival date & time: 09/30/20  1132      History   Chief Complaint Chief Complaint  Patient presents with   Otalgia    right    HPI Timothy Noble is a 87 y.o. male.   HPI  87 year old male here for evaluation of right ear pain and nasal congestion.  Patient is here with his son-in-law due to having dementia and his son-in-law reports that he has been complaining of changes in his hearing in his right ear for the last several days and then was not able to hear at all out of his right ear this morning.  He had a froggy throat yesterday which is resolved and a slight cough yesterday which is also resolved.  Patient and son-in-law deny runny nose or nasal congestion, fever, or drainage from the ear.  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  Patient was last seen by ENT within the previous 6 months and also had an audiogram at that time.  Past Medical History:  Diagnosis Date   Cancer ([place])    Dementia ([place])    Lung cancer [facility])     Patient Active Problem List   Diagnosis Date Noted   Allergy 02/17/2020   Anemia 02/17/2020   Back pain 02/17/2020   BPH (benign prostatic hyperplasia) 02/17/2020   COPD (chronic obstructive pulmonary disease) ([place]) 02/17/2020   HOH (hard of hearing) 02/17/2020   Hydrocele 02/17/2020   Pain management contract agreement 11/06/2019   Esophageal dysmotility 02/05/2016   Oropharyngeal dysphagia 02/05/2016   Acute bronchitis due to other specified organisms 01/31/2016    Past Surgical History:  Procedure Laterality Date   CHOLECYSTECTOMY     LUNG SURGERY Left    rotator cuff surgery         Home Medications    Prior to Admission medications   Medication Sig Start Date End Date Taking? Authorizing Provider  acetaminophen (TYLENOL) 500 MG tablet Take by mouth.    [provider]  budesonide-formoterol (SYMBICORT) 160-4.5 MCG/ACT inhaler Inhale into  the lungs.    [provider]  buprenorphine (BUTRANS) 5 MCG/HR PTWK 1 patch once a week. 11/18/19   [provider]  calcium-vitamin D (OSCAL WITH D) 500-200 MG-UNIT TABS tablet Take by mouth.    [provider]  cetirizine (ZYRTEC) 10 MG tablet Take by mouth.    [provider]  diclofenac Sodium (VOLTAREN) 1 % GEL APPLY 2 GRAMS TOPICALLY FOUR TIMES A DAY AS DIRECTED 11/25/19   [provider]  finasteride (PROSCAR) 5 MG tablet Take by mouth.    [provider]  fluticasone (FLONASE) 50 MCG/ACT nasal spray 2 sprays by Each Nare route two (2) times a day as needed for rhinitis.    [provider]  gabapentin (NEURONTIN) 300 MG capsule Take by mouth.    [provider]  guaifenesin (HUMIBID E) 400 MG TABS tablet Take 1 tablet by mouth every 6 (six) hours as needed. 04/16/20   [provider]  lidocaine (LIDODERM) 5 % APPLY 1 PATCH TO SKIN EVERY DAY APPLY ONCE DAILY FOR UP TO 12 HOURS (12 HOURS ON THEN REMOVE FOR 12 HOURS) 03/07/20   [provider]  omeprazole (PRILOSEC) 20 MG capsule Take by mouth.    [provider]  tamsulosin (FLOMAX) 0.4 MG CAPS capsule Take 0.4 mg by mouth.    [provider]  VITAMIN D, CHOLECALCIFEROL,  PO Take 1 tablet by mouth daily.    [provider]    Family History Family History  Family history unknown: Yes    Social History Social History   Tobacco Use   Smoking status: Former    Pack years: 0.00    Types: Cigarettes    Quit date: 06/01/1979    Years since quitting: 41.3   Smokeless tobacco: Never  Vaping Use   Vaping Use: Never used  Substance Use Topics   Alcohol use: Never   Drug use: Never     Allergies   Alendronate sodium   Review of Systems Review of Systems  HENT:  Positive for ear pain and hearing loss. Negative for congestion and rhinorrhea.   Respiratory:  Positive for cough.   Hematological: Negative.    Psychiatric/Behavioral: Negative.      Physical Exam Triage Vital Signs ED Triage Vitals [09/30/20 1225]  Enc Vitals Group     BP 105/72     Pulse Rate 92     Resp 20     Temp 98.2 F (36.8 C)     Temp Source Oral     SpO2 94 %     Weight      Height      Head Circumference      Peak Flow      Pain Score      Pain Loc      Pain Edu?      Excl. in [place]?    No data found.  Updated Vital Signs BP 105/72 (BP Location: Left Arm)   Pulse 92   Temp 98.2 F (36.8 C) (Oral)   Resp 20   SpO2 94%   Visual Acuity Right Eye Distance:   Left Eye Distance:   Bilateral Distance:    Right Eye Near:   Left Eye Near:    Bilateral Near:     Physical Exam Vitals and nursing note reviewed.  Constitutional:      General: He is not in acute distress.    Appearance: Normal appearance. He is normal weight. He is not ill-appearing.  HENT:     Head: Normocephalic and atraumatic.     Right Ear: Ear canal and external ear normal. There is impacted cerumen.     Left Ear: Tympanic membrane, ear canal and external ear normal. There is no impacted cerumen.     Nose: Nose normal.  Cardiovascular:     Rate and Rhythm: Normal rate and regular rhythm.     Pulses: Normal pulses.     Heart sounds: Normal heart sounds. No murmur heard.   No gallop.  Pulmonary:     Effort: Pulmonary effort is normal.     Breath sounds: Normal breath sounds. No wheezing, rhonchi or rales.  Skin:    General: Skin is warm and dry.     Capillary Refill: Capillary refill takes less than 2 seconds.     Findings: No erythema or rash.  Neurological:     General: No focal deficit present.     Mental Status: He is alert and oriented to person, place, and time.  Psychiatric:        Mood and Affect: Mood normal.        Behavior: Behavior normal.        Thought Content: Thought content normal.        Judgment: Judgment normal.     UC Treatments / Results  Labs (all labs ordered are listed, but only  abnormal  results are displayed) Labs Reviewed - No data to display  EKG   Radiology No results found.  Procedures Ear Cerumen Removal  Date/Time: 09/30/2020 12:45 PM Performed by: Margarette Canada, NP Authorized by: Margarette Canada, NP   Consent:    Consent obtained:  Verbal   Consent given by:  Guardian   Risks discussed:  Pain and incomplete removal   Alternatives discussed:  Alternative treatment Procedure details:    Location:  R ear   Procedure type: curette     Procedure outcomes: cerumen removed   Post-procedure details:    Inspection:  No bleeding and TM intact   Hearing quality:  Improved   Procedure completion:  Tolerated (including critical care time)  Medications Ordered in UC Medications - No data to display  Initial Impression / Assessment and Plan / UC Course  I have reviewed the triage vital signs and the nursing notes.  Pertinent labs & imaging results that were available during my care of the patient were reviewed by me and considered in my medical decision making (see chart for details).  Patient is a pleasant 87 year old male here for evaluation of ear pain and decreased hearing in the right ear as outlined in HPI above.  Patient's physical exam reveals hard red cerumen in the right external auditory canal.  The left tympanic membrane is pearly gray with a normal light reflex and a clear external auditory canal.  Nasal mucosa is normal.  Cardiopulmonary exam is benign.  Cerumen removed easily with a curette and upon reassessment the tympanic membrane is intact, pearly gray with a normal light reflex, and the external auditory canal is clear.  Patient is not complaining of any pain or discomfort with palpation of the eustachian tubes externally either.  Suspect patient's hearing loss may be secondary to the cerumen.  Patient reports that he thinks his hearing might have been improved but he does have dementia at baseline.   Final Clinical Impressions(s) / UC Diagnoses    Final diagnoses:  Impacted cerumen of right ear     Discharge Instructions      We were able to successfully remove the earwax from the right ear canal.  If there is no improvement in hearing when you get home and put her hearing aids and I recommend making a follow-up appointment with ENT for repeat audiogram.     ED Prescriptions   None    PDMP not reviewed this encounter.   Margarette Canada, NP 09/30/20 1248

## 2021-02-08 ENCOUNTER — Telehealth: Payer: Self-pay | Admitting: *Deleted

## 2021-02-08 NOTE — Telephone Encounter (Signed)
"  My dad's visit was approved by the [place].  I'm calling to get him scheduled for an appointment."  We have not received the referral yet.  "I've been trying to call them and can never seem to get through.  I'm on hold for what seems like forever.  Can you all maybe fax them a request?"  Yes, I will send a request to the [place].  "Will you call and let us know once it's approved?"  The referral coordinator will give you a call and get him scheduled.  "Okay great because he really needs it.  Thank you for your help."  Elmyra Ricks, please initiate a SARS request for the [place] for this patient.)

## 2021-02-10 NOTE — Telephone Encounter (Signed)
Faxed over request to [facility]

## 2021-03-03 ENCOUNTER — Ambulatory Visit (INDEPENDENT_AMBULATORY_CARE_PROVIDER_SITE_OTHER): Payer: No Typology Code available for payment source | Admitting: Podiatry

## 2021-03-03 ENCOUNTER — Other Ambulatory Visit: Payer: Self-pay

## 2021-03-03 ENCOUNTER — Encounter: Payer: Self-pay | Admitting: Podiatry

## 2021-03-03 DIAGNOSIS — Z719 Counseling, unspecified: Secondary | ICD-10-CM | POA: Insufficient documentation

## 2021-03-03 DIAGNOSIS — IMO0001 Reserved for inherently not codable concepts without codable children: Secondary | ICD-10-CM | POA: Insufficient documentation

## 2021-03-03 DIAGNOSIS — M519 Unspecified thoracic, thoracolumbar and lumbosacral intervertebral disc disorder: Secondary | ICD-10-CM | POA: Insufficient documentation

## 2021-03-03 DIAGNOSIS — F05 Delirium due to known physiological condition: Secondary | ICD-10-CM | POA: Insufficient documentation

## 2021-03-03 DIAGNOSIS — C3490 Malignant neoplasm of unspecified part of unspecified bronchus or lung: Secondary | ICD-10-CM | POA: Insufficient documentation

## 2021-03-03 DIAGNOSIS — Z0110 Encounter for examination of ears and hearing without abnormal findings: Secondary | ICD-10-CM | POA: Insufficient documentation

## 2021-03-03 DIAGNOSIS — R918 Other nonspecific abnormal finding of lung field: Secondary | ICD-10-CM | POA: Insufficient documentation

## 2021-03-03 DIAGNOSIS — R1010 Upper abdominal pain, unspecified: Secondary | ICD-10-CM | POA: Insufficient documentation

## 2021-03-03 DIAGNOSIS — G3184 Mild cognitive impairment, so stated: Secondary | ICD-10-CM | POA: Insufficient documentation

## 2021-03-03 DIAGNOSIS — R451 Restlessness and agitation: Secondary | ICD-10-CM | POA: Insufficient documentation

## 2021-03-03 DIAGNOSIS — L739 Follicular disorder, unspecified: Secondary | ICD-10-CM | POA: Insufficient documentation

## 2021-03-03 DIAGNOSIS — M79676 Pain in unspecified toe(s): Secondary | ICD-10-CM | POA: Diagnosis not present

## 2021-03-03 DIAGNOSIS — M899 Disorder of bone, unspecified: Secondary | ICD-10-CM | POA: Insufficient documentation

## 2021-03-03 DIAGNOSIS — F32A Depression, unspecified: Secondary | ICD-10-CM | POA: Insufficient documentation

## 2021-03-03 DIAGNOSIS — F03918 Unspecified dementia, unspecified severity, with other behavioral disturbance: Secondary | ICD-10-CM | POA: Insufficient documentation

## 2021-03-03 DIAGNOSIS — R2689 Other abnormalities of gait and mobility: Secondary | ICD-10-CM | POA: Insufficient documentation

## 2021-03-03 DIAGNOSIS — I214 Non-ST elevation (NSTEMI) myocardial infarction: Secondary | ICD-10-CM | POA: Insufficient documentation

## 2021-03-03 DIAGNOSIS — H9193 Unspecified hearing loss, bilateral: Secondary | ICD-10-CM | POA: Insufficient documentation

## 2021-03-03 DIAGNOSIS — B351 Tinea unguium: Secondary | ICD-10-CM

## 2021-03-03 DIAGNOSIS — U071 COVID-19: Secondary | ICD-10-CM | POA: Insufficient documentation

## 2021-03-03 DIAGNOSIS — M47812 Spondylosis without myelopathy or radiculopathy, cervical region: Secondary | ICD-10-CM | POA: Insufficient documentation

## 2021-03-03 DIAGNOSIS — Z659 Problem related to unspecified psychosocial circumstances: Secondary | ICD-10-CM | POA: Insufficient documentation

## 2021-03-03 DIAGNOSIS — Z461 Encounter for fitting and adjustment of hearing aid: Secondary | ICD-10-CM | POA: Insufficient documentation

## 2021-03-03 DIAGNOSIS — J309 Allergic rhinitis, unspecified: Secondary | ICD-10-CM | POA: Insufficient documentation

## 2021-03-03 DIAGNOSIS — E782 Mixed hyperlipidemia: Secondary | ICD-10-CM | POA: Insufficient documentation

## 2021-03-03 DIAGNOSIS — J339 Nasal polyp, unspecified: Secondary | ICD-10-CM | POA: Insufficient documentation

## 2021-03-03 DIAGNOSIS — F0390 Unspecified dementia without behavioral disturbance: Secondary | ICD-10-CM | POA: Insufficient documentation

## 2021-03-03 DIAGNOSIS — M6281 Muscle weakness (generalized): Secondary | ICD-10-CM | POA: Insufficient documentation

## 2021-03-03 DIAGNOSIS — M8440XA Pathological fracture, unspecified site, initial encounter for fracture: Secondary | ICD-10-CM | POA: Insufficient documentation

## 2021-03-03 DIAGNOSIS — I10 Essential (primary) hypertension: Secondary | ICD-10-CM | POA: Insufficient documentation

## 2021-03-03 DIAGNOSIS — M810 Age-related osteoporosis without current pathological fracture: Secondary | ICD-10-CM | POA: Insufficient documentation

## 2021-03-03 DIAGNOSIS — Z85118 Personal history of other malignant neoplasm of bronchus and lung: Secondary | ICD-10-CM | POA: Insufficient documentation

## 2021-03-03 DIAGNOSIS — H903 Sensorineural hearing loss, bilateral: Secondary | ICD-10-CM | POA: Insufficient documentation

## 2021-03-08 ENCOUNTER — Encounter: Payer: Self-pay | Admitting: Podiatry

## 2021-03-08 NOTE — Progress Notes (Signed)
  Subjective:  Patient ID: Timothy Noble, male    DOB: [birth year],  MRN: [id number]  Chief Complaint  Patient presents with   Debridement    Trim toenails    87 y.o. male presents with the above complaint. History confirmed with patient.  He has significantly thickened elongated painful toenails.  He is here with his daughter.  He has dementia  Objective:  Physical Exam: warm, good capillary refill, no trophic changes or ulcerative lesions, normal DP and PT pulses, normal sensory exam, and varicose veins noted. Left Foot: dystrophic yellowed discolored nail plates with subungual debris Right Foot: dystrophic yellowed discolored nail plates with subungual debris  Assessment:   1. Pain due to onychomycosis of toenail      Plan:  Patient was evaluated and treated and all questions answered.  Discussed the etiology and treatment options for the condition in detail with the patient. Educated patient on the topical and oral treatment options for mycotic nails. Recommended debridement of the nails today. Sharp and mechanical debridement performed of all painful and mycotic nails today. Nails debrided in length and thickness using a nail nipper to level of comfort. Discussed treatment options including appropriate shoe gear. Follow up as needed for painful nails.    Return if symptoms worsen or fail to improve.

## 2021-03-10 ENCOUNTER — Ambulatory Visit: Payer: Self-pay | Admitting: Podiatry

## 2021-03-11 ENCOUNTER — Ambulatory Visit (INDEPENDENT_AMBULATORY_CARE_PROVIDER_SITE_OTHER): Payer: Medicare Other | Admitting: Family Medicine

## 2021-03-11 ENCOUNTER — Encounter: Payer: Self-pay | Admitting: Family Medicine

## 2021-03-11 ENCOUNTER — Other Ambulatory Visit: Payer: Self-pay

## 2021-03-11 VITALS — BP 138/63 | HR 82 | Ht 69.0 in | Wt 157.4 lb

## 2021-03-11 DIAGNOSIS — Z85118 Personal history of other malignant neoplasm of bronchus and lung: Secondary | ICD-10-CM

## 2021-03-11 DIAGNOSIS — E782 Mixed hyperlipidemia: Secondary | ICD-10-CM

## 2021-03-11 DIAGNOSIS — I10 Essential (primary) hypertension: Secondary | ICD-10-CM | POA: Diagnosis not present

## 2021-03-11 DIAGNOSIS — Z902 Acquired absence of lung [part of]: Secondary | ICD-10-CM | POA: Insufficient documentation

## 2021-03-11 DIAGNOSIS — G8929 Other chronic pain: Secondary | ICD-10-CM | POA: Diagnosis not present

## 2021-03-11 DIAGNOSIS — M545 Low back pain, unspecified: Secondary | ICD-10-CM | POA: Diagnosis not present

## 2021-03-11 DIAGNOSIS — H903 Sensorineural hearing loss, bilateral: Secondary | ICD-10-CM

## 2021-03-11 NOTE — Patient Instructions (Addendum)
Thank you for coming to the office today.  Please keep in touch if/when you need any acute care assistance, we can see you as needed.   [facility]  2 locations [address], [place], [place] [address] - [phone] [address], [place], [place] [address] - [phone]  ----------------------------------------------  [facility] [place], [place] [address] Phone: [phone]  ------------------------------------------------  [facility] Address: [address], [place], [place] [address] Phone: [phone]   Please schedule a Follow-up Appointment to: Return if symptoms worsen or fail to improve.  If you have any other questions or concerns, please feel free to call the office or send a message through [place]. You may also schedule an earlier appointment if necessary.  Additionally, you may be receiving a survey about your experience at our office within a few days to 1 week by e-mail or mail. We value your feedback.  Nobie Putnam, DO [place]
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
## 2021-03-24 ENCOUNTER — Ambulatory Visit: Payer: Self-pay | Admitting: *Deleted

## 2021-03-24 ENCOUNTER — Telehealth: Payer: Self-pay | Admitting: *Deleted

## 2021-03-24 NOTE — Telephone Encounter (Signed)
Pt's daughter calling, per agent  Pt's bottom sore." CAal was dropped or daughter hung up during transfer to triage. Attempted to call back, line was disconnected when call connected.

## 2021-03-24 NOTE — Telephone Encounter (Signed)
See telephone encounter. Attempted to reach, daughter had called back but call dropped or hung up during transfer to triage. Attempted to reach, line disconnected when call answered. Will continue attempts to reach.

## 2021-03-24 NOTE — Telephone Encounter (Signed)
°  Chief Complaint: sores on buttocks Symptoms: 2 sores that are red and tender on both buttocks Frequency: 4-5 days Pertinent Negatives: NA Disposition: [] ED /[] Urgent Care (no appt availability in office) / [x] Appointment(In office/virtual)/ []   Virtual Care/ [] Home Care/ [] Refused Recommended Disposition  Additional Notes: Pt's daughter called, states she has been putting triple abx ointment on areas and aren't getting better and they are bothering pt and itching and causing discomfort. She is requesting appt before 03/30/21. Advised there is a virtual visit on 03/26/21 at 1600 with regina. She asked to schedule appt but see if it can be in person instead since they live close to office. Advised her scheduled and if cant be changed, someone will call and let her know. Care advice given and she verbalized understanding. No other questions/concerns noted.    Reason for Disposition  [1] Unexplained sores AND [2] 3 or more  Answer Assessment - Initial Assessment Questions 1. APPEARANCE of RASH: "Describe the rash."      Round and red, looks like a sore 2. LOCATION: "Where is the rash located?"      Both buttocks 3. NUMBER: "How many spots are there?"      2 4. SIZE: "How big are the spots?" (Inches, centimeters or compare to size of a coin)      Smaller than a dime 5. ONSET: "When did the rash start?"      4-5 days 6. ITCHING: "Does the rash itch?" If Yes, ask: "How bad is the itch?"  (Scale 0-10; or none, mild, moderate, severe)     mild 7. PAIN: "Does the rash hurt?" If Yes, ask: "How bad is the pain?"  (Scale 0-10; or none, mild, moderate, severe)    - NONE (0): no pain    - MILD (1-3): doesn't interfere with normal activities     - MODERATE (4-7): interferes with normal activities or awakens from sleep     - SEVERE (8-10): excruciating pain, unable to do any normal activities     discomfort 8. OTHER SYMPTOMS: "Do you have any other symptoms?" (e.g., fever)      no  Protocols used: Rash or Redness - Localized-A-AH, Sores-A-AH

## 2021-03-26 ENCOUNTER — Ambulatory Visit: Payer: Medicare Other | Admitting: Internal Medicine

## 2021-03-26 NOTE — Telephone Encounter (Signed)
Patient daughter Timothy Noble called in to cancel appointment say patient seem to be getting better and ask that [name] give her a call back say that she may cancel appointment on 03/30/21 but don't want to be charged for late cancellation. Please call Ph# [phone]

## 2021-03-26 NOTE — Progress Notes (Deleted)
Subjective:    Patient ID: Timothy Noble, male    DOB: Dec 03, 1930, 87 y.o.   MRN: [id number]  HPI  Patient presents the clinic today with complaint of a rash to his left buttock.  He noticed this 1 week ago.  Review of Systems  Past Medical History:  Diagnosis Date   Cancer ([place])    Dementia ([place])    GERD (gastroesophageal reflux disease)    Lung cancer ([facility])     Current Outpatient Medications  Medication Sig Dispense Refill   acetaminophen (TYLENOL) 325 MG tablet TAKE THREE TABLETS BY MOUTH EVERY 8 HOURS AS NEEDED FOR PAIN OR FEVER EACH TABLET CONTAINS 325MG  ACETAMINOPHEN (TYLENOL,APAP). MAXIMUM DAILY RECOMMENDED DOSE IS 3000MG      ascorbic acid (VITAMIN C) 500 MG tablet Take 1 tablet by mouth daily.     budesonide-formoterol (SYMBICORT) 160-4.5 MCG/ACT inhaler Inhale into the lungs.     calcium-vitamin D (OSCAL WITH D) 500-200 MG-UNIT TABS tablet Take 1 tablet by mouth daily.     cetirizine (ZYRTEC) 10 MG tablet Take 10 mg by mouth daily.     diclofenac Sodium (VOLTAREN) 1 % GEL APPLY 2 GRAMS TOPICALLY FOUR TIMES A DAY AS DIRECTED     finasteride (PROSCAR) 5 MG tablet Take 5 mg by mouth daily.     fluticasone (FLONASE) 50 MCG/ACT nasal spray 2 sprays by Each Nare route two (2) times a day as needed for rhinitis.     gabapentin (NEURONTIN) 300 MG capsule Take 300 mg by mouth 2 (two) times daily.     guaifenesin (HUMIBID E) 400 MG TABS tablet Take 1 tablet by mouth every 6 (six) hours as needed.     lidocaine (LIDODERM) 5 % APPLY 1 PATCH TO SKIN EVERY DAY APPLY ONCE DAILY FOR UP TO 12 HOURS (12 HOURS ON THEN REMOVE FOR 12 HOURS)     omeprazole (PRILOSEC) 20 MG capsule Take by mouth.     tamsulosin (FLOMAX) 0.4 MG CAPS capsule Take 0.4 mg by mouth.     VITAMIN D, CHOLECALCIFEROL, PO Take 1 tablet by mouth daily.     No current facility-administered medications for this visit.    Allergies  Allergen Reactions   Alendronate Sodium    Terazosin     Other reaction(s): Dizziness    Oxycodone     Other reaction(s): Delirium    Family History  Family history unknown: Yes    Social History   Socioeconomic History   Marital status: Widowed    Spouse name: Not on file   Number of children: Not on file   Years of education: Not on file   Highest education level: Not on file  Occupational History   Not on file  Tobacco Use   Smoking status: Former    Types: Cigarettes    Quit date: 06/01/1979    Years since quitting: 41.8   Smokeless tobacco: Never  Vaping Use   Vaping Use: Never used  Substance and Sexual Activity   Alcohol use: Never   Drug use: Never   Sexual activity: Not on file  Other Topics Concern   Not on file  Social History Narrative   Not on file   Social Determinants of Health   Financial Resource Strain: Not on file  Food Insecurity: Not on file  Transportation Needs: Not on file  Physical Activity: Not on file  Stress: Not on file  Social Connections: Not on file  Intimate Partner Violence: Not on file  Constitutional: Denies fever, malaise, fatigue, headache or abrupt weight changes.  HEENT: Denies eye pain, eye redness, ear pain, ringing in the ears, wax buildup, runny nose, nasal congestion, bloody nose, or sore throat. Respiratory: Denies difficulty breathing, shortness of breath, cough or sputum production.   Cardiovascular: Denies chest pain, chest tightness, palpitations or swelling in the hands or feet.  Gastrointestinal: Denies abdominal pain, bloating, constipation, diarrhea or blood in the stool.  GU: Denies urgency, frequency, pain with urination, burning sensation, blood in urine, odor or discharge. Musculoskeletal: Denies decrease in range of motion, difficulty with gait, muscle pain or joint pain and swelling.  Skin: Patient reports rash of buttock.  Denies ulcercations.  Neurological: Denies dizziness, difficulty with memory, difficulty with speech or problems with balance and coordination.  Psych: Denies  anxiety, depression, SI/HI.  No other specific complaints in a complete review of systems (except as listed in HPI above).     Objective:   Physical Exam  There were no vitals taken for this visit. Wt Readings from Last 3 Encounters:  03/11/21 157 lb 6.4 oz (71.4 kg)  06/16/20 160 lb (72.6 kg)  11/23/19 160 lb (72.6 kg)    General: Appears their stated age, well developed, well nourished in NAD. Skin: Warm, dry and intact. No rashes, lesions or ulcerations noted. HEENT: Head: normal shape and size; Eyes: sclera white, no icterus, conjunctiva pink, PERRLA and EOMs intact; Ears: Tm's gray and intact, normal light reflex; Nose: mucosa pink and moist, septum midline; Throat/Mouth: Teeth present, mucosa pink and moist, no exudate, lesions or ulcerations noted.  Neck:  Neck supple, trachea midline. No masses, lumps or thyromegaly present.  Cardiovascular: Normal rate and rhythm. S1,S2 noted.  No murmur, rubs or gallops noted. No JVD or BLE edema. No carotid bruits noted. Pulmonary/Chest: Normal effort and positive vesicular breath sounds. No respiratory distress. No wheezes, rales or ronchi noted.  Abdomen: Soft and nontender. Normal bowel sounds. No distention or masses noted. Liver, spleen and kidneys non palpable. Musculoskeletal: Normal range of motion. No signs of joint swelling. No difficulty with gait.  Neurological: Alert and oriented. Cranial nerves II-XII grossly intact. Coordination normal.  Psychiatric: Mood and affect normal. Behavior is normal. Judgment and thought content normal.    BMET    Component Value Date/Time   NA 137 06/16/2020 1734   K 4.0 06/16/2020 1734   CL 106 06/16/2020 1734   CO2 24 06/16/2020 1734   GLUCOSE 96 06/16/2020 1734   BUN 19 06/16/2020 1734   CREATININE 1.15 06/16/2020 1734   CALCIUM 9.5 06/16/2020 1734   GFRNONAA >60 06/16/2020 1734   GFRAA 52 (L) 11/23/2019 1229    Lipid Panel  No results found for: CHOL, TRIG, HDL, CHOLHDL, VLDL,  LDLCALC  CBC    Component Value Date/Time   WBC 9.7 06/16/2020 1734   RBC 4.35 06/16/2020 1734   HGB 13.5 06/16/2020 1734   HCT 40.1 06/16/2020 1734   PLT 181 06/16/2020 1734   MCV 92.2 06/16/2020 1734   MCH 31.0 06/16/2020 1734   MCHC 33.7 06/16/2020 1734   RDW 12.7 06/16/2020 1734   LYMPHSABS 1.9 06/16/2020 1734   MONOABS 0.8 06/16/2020 1734   EOSABS 0.6 (H) 06/16/2020 1734   BASOSABS 0.1 06/16/2020 1734    Hgb A1C No results found for: HGBA1C          Assessment & Plan:   Webb Silversmith, NP  This visit occurred during the SARS-CoV-2 public health emergency.  Safety protocols were  in place, including screening questions prior to the visit, additional usage of staff PPE, and extensive cleaning of exam room while observing appropriate contact time as indicated for disinfecting solutions.

## 2021-03-30 ENCOUNTER — Ambulatory Visit (INDEPENDENT_AMBULATORY_CARE_PROVIDER_SITE_OTHER): Payer: Medicare Other | Admitting: Internal Medicine

## 2021-03-30 ENCOUNTER — Encounter: Payer: Self-pay | Admitting: Internal Medicine

## 2021-03-30 ENCOUNTER — Other Ambulatory Visit: Payer: Self-pay

## 2021-03-30 VITALS — BP 134/70 | HR 85 | Temp 98.4°F | Resp 18 | Ht 69.0 in | Wt 164.0 lb

## 2021-03-30 DIAGNOSIS — R21 Rash and other nonspecific skin eruption: Secondary | ICD-10-CM

## 2021-03-30 MED ORDER — CLOTRIMAZOLE-BETAMETHASONE 1-0.05 % EX CREA: TOPICAL_CREAM | CUTANEOUS | 1 refills | Status: DC

## 2021-03-30 NOTE — Progress Notes (Signed)
Subjective:    Patient ID: Timothy Noble, male    DOB: Jul 21, 1930, 88 y.o.   MRN: [id number]  HPI  Patient presents the clinic today with complaint of skin irritation of buttocks.  He noticed this 1 week ago.  He has not noticed any drainage from the area.  His daughter has been putting a fungal cream on it with minimal improvement in symptoms.  He reports the area is painful when he sits down.  Review of Systems     Past Medical History:  Diagnosis Date   Cancer ([place])    Dementia ([place])    GERD (gastroesophageal reflux disease)    Lung cancer ([facility])     Current Outpatient Medications  Medication Sig Dispense Refill   acetaminophen (TYLENOL) 325 MG tablet TAKE THREE TABLETS BY MOUTH EVERY 8 HOURS AS NEEDED FOR PAIN OR FEVER EACH TABLET CONTAINS 325MG  ACETAMINOPHEN (TYLENOL,APAP). MAXIMUM DAILY RECOMMENDED DOSE IS 3000MG      ascorbic acid (VITAMIN C) 500 MG tablet Take 1 tablet by mouth daily.     budesonide-formoterol (SYMBICORT) 160-4.5 MCG/ACT inhaler Inhale into the lungs.     calcium-vitamin D (OSCAL WITH D) 500-200 MG-UNIT TABS tablet Take 1 tablet by mouth daily.     cetirizine (ZYRTEC) 10 MG tablet Take 10 mg by mouth daily.     diclofenac Sodium (VOLTAREN) 1 % GEL APPLY 2 GRAMS TOPICALLY FOUR TIMES A DAY AS DIRECTED     finasteride (PROSCAR) 5 MG tablet Take 5 mg by mouth daily.     fluticasone (FLONASE) 50 MCG/ACT nasal spray 2 sprays by Each Nare route two (2) times a day as needed for rhinitis.     gabapentin (NEURONTIN) 300 MG capsule Take 300 mg by mouth 2 (two) times daily.     guaifenesin (HUMIBID E) 400 MG TABS tablet Take 1 tablet by mouth every 6 (six) hours as needed.     lidocaine (LIDODERM) 5 % APPLY 1 PATCH TO SKIN EVERY DAY APPLY ONCE DAILY FOR UP TO 12 HOURS (12 HOURS ON THEN REMOVE FOR 12 HOURS)     omeprazole (PRILOSEC) 20 MG capsule Take by mouth.     tamsulosin (FLOMAX) 0.4 MG CAPS capsule Take 0.4 mg by mouth.     VITAMIN D, CHOLECALCIFEROL, PO Take 1  tablet by mouth daily.     No current facility-administered medications for this visit.    Allergies  Allergen Reactions   Alendronate Sodium    Terazosin     Other reaction(s): Dizziness   Oxycodone     Other reaction(s): Delirium    Family History  Family history unknown: Yes    Social History   Socioeconomic History   Marital status: Widowed    Spouse name: Not on file   Number of children: Not on file   Years of education: Not on file   Highest education level: Not on file  Occupational History   Not on file  Tobacco Use   Smoking status: Former    Types: Cigarettes    Quit date: 06/01/1979    Years since quitting: 41.8   Smokeless tobacco: Never  Vaping Use   Vaping Use: Never used  Substance and Sexual Activity   Alcohol use: Never   Drug use: Never   Sexual activity: Not on file  Other Topics Concern   Not on file  Social History Narrative   Not on file   Social Determinants of Health   Financial Resource Strain: Not on file  Food Insecurity: Not on file  Transportation Needs: Not on file  Physical Activity: Not on file  Stress: Not on file  Social Connections: Not on file  Intimate Partner Violence: Not on file     Constitutional: Denies fever, malaise, fatigue, headache or abrupt weight changes.  Respiratory: Denies cough or sputum production.   Cardiovascular: Denies chest pain, chest tightness, palpitations or swelling in the hands or feet.  Skin: Patient reports skin irritation of buttocks.  Denies ulcercations.    No other specific complaints in a complete review of systems (except as listed in HPI above).  Objective:   Physical Exam  BP 134/70 (BP Location: Right Arm, Patient Position: Sitting, Cuff Size: Normal)    Pulse 85    Temp 98.4 F (36.9 C) (Temporal)    Resp 18    Ht 5\' 9"  (1.753 m)    Wt 164 lb (74.4 kg)    SpO2 98%    BMI 24.22 kg/m   Wt Readings from Last 3 Encounters:  03/11/21 157 lb 6.4 oz (71.4 kg)  06/16/20 160 lb  (72.6 kg)  11/23/19 160 lb (72.6 kg)    General: Appears his stated age, chronically ill appearing ,in NAD. Skin: Warm, dry and intact.  1 cm circular pitted lesion with scaly border noted of left upper buttocks.  3 mm round scaly lesion noted of right upper buttock. Cardiovascular: Normal rate. Pulmonary/Chest: Normal effort. Musculoskeletal: Gait slow and steady without device. Neurological: Alert.  BMET    Component Value Date/Time   NA 137 06/16/2020 1734   K 4.0 06/16/2020 1734   CL 106 06/16/2020 1734   CO2 24 06/16/2020 1734   GLUCOSE 96 06/16/2020 1734   BUN 19 06/16/2020 1734   CREATININE 1.15 06/16/2020 1734   CALCIUM 9.5 06/16/2020 1734   GFRNONAA >60 06/16/2020 1734   GFRAA 52 (L) 11/23/2019 1229    Lipid Panel  No results found for: CHOL, TRIG, HDL, CHOLHDL, VLDL, LDLCALC  CBC    Component Value Date/Time   WBC 9.7 06/16/2020 1734   RBC 4.35 06/16/2020 1734   HGB 13.5 06/16/2020 1734   HCT 40.1 06/16/2020 1734   PLT 181 06/16/2020 1734   MCV 92.2 06/16/2020 1734   MCH 31.0 06/16/2020 1734   MCHC 33.7 06/16/2020 1734   RDW 12.7 06/16/2020 1734   LYMPHSABS 1.9 06/16/2020 1734   MONOABS 0.8 06/16/2020 1734   EOSABS 0.6 (H) 06/16/2020 1734   BASOSABS 0.1 06/16/2020 1734    Hgb A1C No results found for: HGBA1C         Assessment & Plan:  Rash of Buttock:  Appears fungal We will try Lotrisone cream twice daily  Return precautions discussed  Webb Silversmith, NP This visit occurred during the SARS-CoV-2 public health emergency.  Safety protocols were in place, including screening questions prior to the visit, additional usage of staff PPE, and extensive cleaning of exam room while observing appropriate contact time as indicated for disinfecting solutions.

## 2021-03-30 NOTE — Patient Instructions (Signed)
Rash, Adult  A rash is a change in the color of your skin. A rash can also change the way your skin feels. There are many different conditions and factors that can causea rash. Follow these instructions at home: The goal of treatment is to stop the itching and keep the rash from spreading. Watch for any changes in your symptoms. Let your doctor know about them. Followthese instructions to help with your condition: Medicine Take or apply over-the-counter and prescription medicines only as told by your doctor. These may include medicines: To treat red or swollen skin (corticosteroid creams). To treat itching. To treat an allergy (oral antihistamines). To treat very bad symptoms (oral corticosteroids).  Skin care Put cool cloths (compresses) on the affected areas. Do not scratch or rub your skin. Avoid covering the rash. Make sure that the rash is exposed to air as much as possible. Managing itching and discomfort Avoid hot showers or baths. These can make itching worse. A cold shower may help. Try taking a bath with: Epsom salts. You can get these at your local pharmacy or grocery store. Follow the instructions on the package. Baking soda. Pour a small amount into the bath as told by your doctor. Colloidal oatmeal. You can get this at your local pharmacy or grocery store. Follow the instructions on the package. Try putting baking soda paste onto your skin. Stir water into baking soda until it gets like a paste. Try putting on a lotion that relieves itchiness (calamine lotion). Keep cool and out of the sun. Sweating and being hot can make itching worse. General instructions  Rest as needed. Drink enough fluid to keep your pee (urine) pale yellow. Wear loose-fitting clothing. Avoid scented soaps, detergents, and perfumes. Use gentle soaps, detergents, perfumes, and other cosmetic products. Avoid anything that causes your rash. Keep a journal to help track what causes your rash. Write  down: What you eat. What cosmetic products you use. What you drink. What you wear. This includes jewelry. Keep all follow-up visits as told by your doctor. This is important.  Contact a doctor if: You sweat at night. You lose weight. You pee (urinate) more than normal. You pee less than normal, or you notice that your pee is a darker color than normal. You feel weak. You throw up (vomit). Your skin or the whites of your eyes look yellow (jaundice). Your skin: Tingles. Is numb. Your rash: Does not go away after a few days. Gets worse. You are: More thirsty than normal. More tired than normal. You have: New symptoms. Pain in your belly (abdomen). A fever. Watery poop (diarrhea). Get help right away if: You have a fever and your symptoms suddenly get worse. You start to feel mixed up (confused). You have a very bad headache or a stiff neck. You have very bad joint pains or stiffness. You have jerky movements that you cannot control (seizure). Your rash covers all or most of your body. The rash may or may not be painful. You have blisters that: Are on top of the rash. Grow larger. Grow together. Are painful. Are inside your nose or mouth. You have a rash that: Looks like purple pinprick-sized spots all over your body. Has a "bull's eye" or looks like a target. Is red and painful, causes your skin to peel, and is not from being in the sun too long. Summary A rash is a change in the color of your skin. A rash can also change the way your skin feels.   The goal of treatment is to stop the itching and keep the rash from spreading. Take or apply over-the-counter and prescription medicines only as told by your doctor. Contact a doctor if you have new symptoms or symptoms that get worse. Keep all follow-up visits as told by your doctor. This is important. This information is not intended to replace advice given to you by your health care provider. Make sure you discuss any  questions you have with your healthcare provider. Document Revised: 07/06/2018 Document Reviewed: 10/16/2017 Elsevier Patient Education  2022 [employer]vier Inc.  

## 2021-04-08 ENCOUNTER — Telehealth: Payer: Self-pay

## 2021-04-08 DIAGNOSIS — M545 Low back pain, unspecified: Secondary | ICD-10-CM

## 2021-04-08 DIAGNOSIS — G8929 Other chronic pain: Secondary | ICD-10-CM

## 2021-04-08 NOTE — Telephone Encounter (Signed)
Referral ordered as requested.  Timothy Noble, [facility] 04/08/2021, 3:42 PM

## 2021-04-08 NOTE — Telephone Encounter (Signed)
Copied from [place] [id number]. Topic: Referral - Request for Referral >> Apr 08, 2021  2:03 PM Erick Blinks wrote: Has patient seen PCP for this complaint? Yes.   *If NO, is insurance requiring patient see PCP for this issue before PCP can refer them? Referral for which specialty: Chiropractor  Preferred provider/office: Dr. Ronnald Ramp  Fax: [phone] Reason for referral:  Pt needs this for his back

## 2021-05-24 ENCOUNTER — Ambulatory Visit: Payer: Self-pay | Admitting: *Deleted

## 2021-05-24 NOTE — Telephone Encounter (Signed)
°  Chief Complaint: patent's daughter with patient c/o wheezing , request medication and appt today  Symptoms: cough , clear sputum, wheezing audible and started today . Constant even after using inhaler and nebulizer Frequency: today  Pertinent Negatives: Patient denies chest pain, difficulty breathing , no fever,  Disposition: [x] ED /[] Urgent Care (no appt availability in office) / [x] Appointment(In office/virtual)/ []  [place] [facility]/ [] Home Care/ [x] Refused Recommended Disposition /[] [place] [facility]/ []  Follow-up with PCP Additional Notes:   Recommended ED due to audible wheezing, daughter refused ED due to long wait times and patient dementia. Requesting appt today with any provider. Appt scheduled for 05/25/21 and instructed patient to call 911 if symptoms worsen. Daughter requesting a call back to try patient with po medication instead of inhaler due to patient refusing to take. Please advise .   Reason for Disposition  Wheezing can be heard across the room  Answer Assessment - Initial Assessment Questions 1. RESPIRATORY STATUS: "Describe your breathing?" (e.g., wheezing, shortness of breath, unable to speak, severe coughing)      Wheezing , cough 2. ONSET: "When did this breathing problem begin?"     Today  3. PATTERN "Does the difficult breathing come and go, or has it been constant since it started?"      Constant  4. SEVERITY: "How bad is your breathing?" (e.g., mild, moderate, severe)    - MILD: No SOB at rest, mild SOB with walking, speaks normally in sentences, can lie down, no retractions, pulse < 100.    - MODERATE: SOB at rest, SOB with minimal exertion and prefers to sit, cannot lie down flat, speaks in phrases, mild retractions, audible wheezing, pulse 100-120.    - SEVERE: Very SOB at rest, speaks in single words, struggling to breathe, sitting hunched forward, retractions, pulse > 120      Denies shortness of breath reports wheezing  5. RECURRENT SYMPTOM:  "Have you had difficulty breathing before?" If Yes, ask: "When was the last time?" and "What happened that time?"      See hx 6. CARDIAC HISTORY: "Do you have any history of heart disease?" (e.g., heart attack, angina, bypass surgery, angioplasty)      See hx  7. LUNG HISTORY: "Do you have any history of lung disease?"  (e.g., pulmonary embolus, asthma, emphysema)     Hx long CA 8. CAUSE: "What do you think is causing the breathing problem?"      Not sure  9. OTHER SYMPTOMS: "Do you have any other symptoms? (e.g., dizziness, runny nose, cough, chest pain, fever)     Cough, clear sputum. Reports wheezing audible  10. O2 SATURATION MONITOR:  "Do you use an oxygen saturation monitor (pulse oximeter) at home?" If Yes, "What is your reading (oxygen level) today?" "What is your usual oxygen saturation reading?" (e.g., 95%)       na 11. PREGNANCY: "Is there any chance you are pregnant?" "When was your last menstrual period?"       na 12. TRAVEL: "Have you traveled out of the country in the last month?" (e.g., travel history, exposures)       na  Protocols used: Breathing Difficulty-A-AH

## 2021-05-24 NOTE — Telephone Encounter (Signed)
Called patient's daughter Cecille Rubin and she request to cancel apt tomorrow 05/25/21 she is scheduling a [facility] apt tomorrow instead.  Nobie Putnam, DO [facility] 05/24/2021, 4:50 PM

## 2021-05-25 ENCOUNTER — Ambulatory Visit: Payer: Medicare Other | Admitting: Family Medicine

## 2021-05-25 DIAGNOSIS — J069 Acute upper respiratory infection, unspecified: Secondary | ICD-10-CM | POA: Diagnosis not present

## 2021-06-15 ENCOUNTER — Ambulatory Visit: Payer: Self-pay

## 2021-06-15 NOTE — Telephone Encounter (Signed)
? ? ?  Chief Complaint: Lower abdominal pain ?Symptoms: Pain, no other symptoms ?Frequency: Started 2 days ago ?Pertinent Negatives: Patient denies diarrhea ?Disposition: [] ED /[] Urgent Care (no appt availability in office) / [] Appointment(In office/virtual)/ []  [facility]/ [] Home Care/ [] Refused Recommended Disposition /[] [place] [facility]/ []  Follow-up with PCP ?Additional Notes: Daughter requests to be worked in this week. Please advise daughter.  ? ?Answer Assessment - Initial Assessment Questions ?1. LOCATION: "Where does it hurt?"  ?    Lower abdomen ?2. RADIATION: "Does the pain shoot anywhere else?" (e.g., chest, back) ?    No ?3. ONSET: "When did the pain begin?" (Minutes, hours or days ago)  ?    Belly button ?4. SUDDEN: "Gradual or sudden onset?" ?    Gradual ?5. PATTERN "Does the pain come and go, or is it constant?" ?   - If constant: "Is it getting better, staying the same, or worsening?"  ?    (Note: Constant means the pain never goes away completely; most serious pain is constant and it progresses)  ?   - If intermittent: "How long does it last?" "Do you have pain now?" ?    (Note: Intermittent means the pain goes away completely between bouts) ?    Comes and goes ?6. SEVERITY: "How bad is the pain?"  (e.g., Scale 1-10; mild, moderate, or severe) ?   - MILD (1-3): doesn't interfere with normal activities, abdomen soft and not tender to touch  ?   - MODERATE (4-7): interferes with normal activities or awakens from sleep, abdomen tender to touch  ?   - SEVERE (8-10): excruciating pain, doubled over, unable to do any normal activities   ?    Moderate-severe ?7. RECURRENT SYMPTOM: "Have you ever had this type of stomach pain before?" If Yes, ask: "When was the last time?" and "What happened that time?"  ?    No ?8. CAUSE: "What do you think is causing the stomach pain?" ?    Unsure ?9. RELIEVING/AGGRAVATING FACTORS: "What makes it better or worse?" (e.g., movement, antacids, bowel  movement) ?    No ?10. OTHER SYMPTOMS: "Do you have any other symptoms?" (e.g., back pain, diarrhea, fever, urination pain, vomiting) ?      No ? ?Protocols used: Abdominal Pain - Male-A-AH ? ?

## 2021-06-15 NOTE — Telephone Encounter (Signed)
Dr. Raliegh Ip is not in office the rest of the week. Erin Mecum, PA will be here Thursday and Friday if they want to schedule with her.  ?

## 2021-06-25 ENCOUNTER — Ambulatory Visit: Payer: Medicare Other | Admitting: Family Medicine

## 2021-07-14 ENCOUNTER — Encounter: Payer: Self-pay | Admitting: Family Medicine

## 2021-07-14 ENCOUNTER — Ambulatory Visit: Payer: Self-pay | Admitting: *Deleted

## 2021-07-14 ENCOUNTER — Ambulatory Visit (INDEPENDENT_AMBULATORY_CARE_PROVIDER_SITE_OTHER): Payer: Medicare Other | Admitting: Family Medicine

## 2021-07-14 VITALS — BP 160/110 | HR 81 | Ht 69.0 in | Wt 160.4 lb

## 2021-07-14 DIAGNOSIS — T17908A Unspecified foreign body in respiratory tract, part unspecified causing other injury, initial encounter: Secondary | ICD-10-CM

## 2021-07-14 DIAGNOSIS — M545 Low back pain, unspecified: Secondary | ICD-10-CM

## 2021-07-14 DIAGNOSIS — J9801 Acute bronchospasm: Secondary | ICD-10-CM | POA: Diagnosis not present

## 2021-07-14 MED ORDER — AMOXICILLIN-POT CLAVULANATE 875-125 MG PO TABS: 1.0000 | ORAL_TABLET | Freq: Two times a day (BID) | ORAL | 0 refills | Status: DC

## 2021-07-14 MED ORDER — PREDNISONE 20 MG PO TABS: ORAL_TABLET | ORAL | 0 refills | Status: DC

## 2021-07-14 NOTE — Telephone Encounter (Signed)
?  Chief Complaint: Congestion ?Symptoms: Wheezing, cough, congestion,SOB with exertion only ?Frequency: 3-4 days ?Pertinent Negatives: Patient denies fever ?Disposition: [] ED /[] Urgent Care (no appt availability in office) / [x] Appointment(In office/virtual)/ []  [facility]/ [] Home Care/ [] Refused Recommended Disposition /[] [place] [facility]/ []  Follow-up with PCP ?Additional Notes: Pt's daughter Cecille Rubin calling. States pt has had similar episodes in past. May have aspirated when eating/drinking. States neb treatments helping. Requesting antibiotics. Advised needs appt, appt secured for today , earliest available 1:40. Advised ED for worsening symptoms, verbalizes understanding.  ? ? Reason for Disposition ? [1] MILD difficulty breathing (e.g., minimal/no SOB at rest, SOB with walking, pulse <100) AND [2] NEW-onset or WORSE than normal ? ?Answer Assessment - Initial Assessment Questions ?1. RESPIRATORY STATUS: "Describe your breathing?" (e.g., wheezing, shortness of breath, unable to speak, severe coughing)  ?    SOB with exertion only, wheezing ?2. ONSET: "When did this breathing problem begin?"  ?    3-4 days ago ?3. PATTERN "Does the difficult breathing come and go, or has it been constant since it started?"  ?   Varies ?4. SEVERITY: "How bad is your breathing?" (e.g., mild, moderate, severe)  ?  - MILD: No SOB at rest, mild SOB with walking, speaks normally in sentences, can lie down, no retractions, pulse < 100.  ?  - MODERATE: SOB at rest, SOB with minimal exertion and prefers to sit, cannot lie down flat, speaks in phrases, mild retractions, audible wheezing, pulse 100-120.  ?  - SEVERE: Very SOB at rest, speaks in single words, struggling to breathe, sitting hunched forward, retractions, pulse > 120  ?    Moderate ?5. RECURRENT SYMPTOM: "Have you had difficulty breathing before?" If Yes, ask: "When was the last time?" and "What happened that time?"  ?    Yes, when aspirated ?6. CARDIAC  HISTORY: "Do you have any history of heart disease?" (e.g., heart attack, angina, bypass surgery, angioplasty)  ?    *No Answer* ?7. LUNG HISTORY: "Do you have any history of lung disease?"  (e.g., pulmonary embolus, asthma, emphysema) ?    *No Answer* ?8. CAUSE: "What do you think is causing the breathing problem?"  ?    Possible aspiration ?9. OTHER SYMPTOMS: "Do you have any other symptoms? (e.g., dizziness, runny nose, cough, chest pain, fever) ?    Cough ?10. O2 SATURATION MONITOR:  "Do you use an oxygen saturation monitor (pulse oximeter) at home?" If Yes, "What is your reading (oxygen level) today?" "What is your usual oxygen saturation reading?" (e.g., 95%) ?      NA ? ?Protocols used: Breathing Difficulty-A-AH ? ?

## 2021-07-14 NOTE — Telephone Encounter (Signed)
FYI

## 2021-07-14 NOTE — Patient Instructions (Addendum)
Thank you for coming to the office today. ? ?Start Augmentin antibiotic for risk of aspiration possible. ? ?Start with Prednisone 7 day taper. ? ?If severe worsening breathing, coughing fever or worsening then you can go to hospital for acute evaluation if severe. ? ?Please schedule a Follow-up Appointment to: Return if symptoms worsen or fail to improve. ? ?If you have any other questions or concerns, please feel free to call the office or send a message through [place]. You may also schedule an earlier appointment if necessary. ? ?Additionally, you may be receiving a survey about your experience at our office within a few days to 1 week by e-mail or mail. We value your feedback. ? ?Nobie Putnam, DO ?[facility] ?

## 2021-07-14 NOTE — Progress Notes (Signed)
? ?Subjective:  ? ? Patient ID: Timothy Noble, male    DOB: Aug 11, 1930, 88 y.o.   MRN: [id number] ? ?Timothy Noble is a 88 y.o. male presenting on 07/14/2021 for Cough and Wheezing ? ?Patient presents for a same day appointment. ? ?Here with daughter Cecille Rubin ? ?HPI ? ?Chest Congestion / Wheezing ?Worsening recently with thought that he aspirated or choked on food recently w Dementia. Using albuterol neb PRN with some relief. ?Family admits upper airway wheezing ?Denies fever or productive cough ? ?Low Back Pain / SI pain ?Going to [facility] today after this apt. ?Has tailbone pain, chronic problem. Sits on cushion for support. ?Admits back / tailbone pain ? ? ? ?  03/30/2021  ?  1:54 PM  ?Depression screen PHQ 2/9  ?Decreased Interest 0  ?Down, Depressed, Hopeless 0  ?PHQ - 2 Score 0  ?Altered sleeping 0  ?Tired, decreased energy 1  ?Change in appetite 0  ?Feeling bad or failure about yourself  0  ?Trouble concentrating 0  ?Moving slowly or fidgety/restless 0  ?Suicidal thoughts 0  ?PHQ-9 Score 1  ?Difficult doing work/chores Not difficult at all  ? ? ?Social History  ? ?Tobacco Use  ? Smoking status: Former  ?  Types: Cigarettes  ?  Quit date: 06/01/1979  ?  Years since quitting: 42.1  ? Smokeless tobacco: Never  ?Vaping Use  ? Vaping Use: Never used  ?Substance Use Topics  ? Alcohol use: Never  ? Drug use: Never  ? ? ?Review of Systems ?Per HPI unless specifically indicated above ? ?   ?Objective:  ?  ?BP (!) 160/110   Pulse 81   Ht 5\' 9"  (1.753 m)   Wt 160 lb 6.4 oz (72.8 kg)   SpO2 95%   BMI 23.69 kg/m?   ?Wt Readings from Last 3 Encounters:  ?07/14/21 160 lb 6.4 oz (72.8 kg)  ?03/30/21 164 lb (74.4 kg)  ?03/11/21 157 lb 6.4 oz (71.4 kg)  ?  ?Physical Exam ?Vitals and nursing note reviewed.  ?Constitutional:   ?   General: He is not in acute distress. ?   Appearance: Normal appearance. He is well-developed. He is not diaphoretic.  ?   Comments: Well-appearing elderly 74 male, comfortable, cooperative  ?HENT:  ?    Head: Normocephalic and atraumatic.  ?   Ears:  ?   Comments: Hard of hearing ?Eyes:  ?   General:     ?   Right eye: No discharge.     ?   Left eye: No discharge.  ?   Conjunctiva/sclera: Conjunctivae normal.  ?Cardiovascular:  ?   Rate and Rhythm: Normal rate.  ?Pulmonary:  ?   Effort: Pulmonary effort is normal.  ?Musculoskeletal:  ?   Comments: Bony tenderness over sacrum / SI region  ?Skin: ?   General: Skin is warm and dry.  ?   Findings: No erythema or rash.  ?Neurological:  ?   Mental Status: He is alert and oriented to person, place, and time.  ?Psychiatric:     ?   Mood and Affect: Mood normal.     ?   Behavior: Behavior normal.     ?   Thought Content: Thought content normal.  ?   Comments: Well groomed, good eye contact, normal speech and thoughts  ? ?Results for orders placed or performed during the hospital encounter of 08/18/20  ?Urine Culture  ? Specimen: Urine, Random  ?Result Value Ref Range  ? Specimen Description    ?  URINE, RANDOM ?Performed at [facility], [address] [place], [place] [address] ?  ? Special Requests    ?  NONE ?Performed at [facility], [address] [place], [place] [address] ?  ? Culture    ?  NO GROWTH ?Performed at [facility], [place] [address], [place],  [address] ?  ? Report Status 08/21/2020 FINAL   ?Urinalysis, Complete w Microscopic Urine, Clean Catch  ?Result Value Ref Range  ? Color, Urine YELLOW YELLOW  ? APPearance CLEAR CLEAR  ? Specific Gravity, Urine 1.025 1.005 - 1.030  ? pH 6.0 5.0 - 8.0  ? Glucose, UA NEGATIVE NEGATIVE mg/dL  ? Hgb urine dipstick NEGATIVE NEGATIVE  ? Bilirubin Urine SMALL (A) NEGATIVE  ? Ketones, ur TRACE (A) NEGATIVE mg/dL  ? Protein, ur NEGATIVE NEGATIVE mg/dL  ? Nitrite NEGATIVE NEGATIVE  ? Leukocytes,Ua NEGATIVE NEGATIVE  ? Squamous Epithelial / LPF 0-5 0 - 5  ? WBC, UA 0-5 0 - 5 WBC/hpf  ? RBC / HPF 0-5 0 - 5 RBC/hpf  ? Bacteria, UA FEW (A) NONE SEEN  ? Mucus PRESENT   ? Hyaline Casts, UA  PRESENT   ? ?   ?Assessment & Plan:  ? ?Problem List Items Addressed This Visit   ? ? Back pain - Primary  ? Relevant Medications  ? predniSONE (DELTASONE) 20 MG tablet  ? ?Other Visit Diagnoses   ? ? Cough due to bronchospasm      ? Relevant Medications  ? amoxicillin-clavulanate (AUGMENTIN) 875-125 MG tablet  ? Aspiration into airway, initial encounter      ? Relevant Medications  ? amoxicillin-clavulanate (AUGMENTIN) 875-125 MG tablet  ? ?  ?  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 ?Cough bronchospasm ?At risk of aspiration w/ dementia by history ?Lungs clear today, no obvious sign of aspiration pneumonia ?Will provide empiric Augmentin course that covers appropriate bacteria and w steroid continue nebulizers ? ?Follow up as advised, reviewed return criteria if develop pneumonia, defer X-ray today ? ?Meds ordered this encounter  ?Medications  ? amoxicillin-clavulanate (AUGMENTIN) 875-125 MG tablet  ?  Sig: Take 1 tablet by mouth 2 (two) times daily.  ?  Dispense:  20 tablet  ?  Refill:  0  ? predniSONE (DELTASONE) 20 MG tablet  ?  Sig: Take daily with food. Start with 60mg  (3 pills) x 2 days, then reduce to 40mg  (2 pills) x 2 days, then 20mg  (1 pill) x 3 days  ?  Dispense:  13 tablet  ?  Refill:  0  ? ? ? ? ?Follow up plan: ?Return if symptoms worsen or fail to improve. ? ? ?Timothy Putnam, DO ?[facility] ?[facility] ?07/14/2021, 1:39 PM ?

## 2021-07-15 ENCOUNTER — Telehealth: Payer: Self-pay | Admitting: Family Medicine

## 2021-07-15 MED ORDER — DOXYCYCLINE HYCLATE 100 MG PO TABS: 100.0000 mg | ORAL_TABLET | Freq: Two times a day (BID) | ORAL | 0 refills | Status: DC

## 2021-07-15 NOTE — Addendum Note (Signed)
Addended by: Jearld Fenton on: 07/15/2021 10:57 AM ? ? Modules accepted: Orders ? ?

## 2021-07-15 NOTE — Telephone Encounter (Signed)
I have sent doxycycline to the pharmacy in place of Augmentin. ?

## 2021-07-15 NOTE — Telephone Encounter (Signed)
Left message advising Lorie ? ?Thanks,  ? ?-Mickel Baas  ?

## 2021-07-15 NOTE — Telephone Encounter (Signed)
Daughter called in for message from PCP, call dropped. Left message to call back. ?

## 2021-07-15 NOTE — Telephone Encounter (Unsigned)
Copied from [place] [phone]. Topic: General - Other ?>> Jul 15, 2021  9:19 AM Tessa Lerner A wrote: ?Reason for CRM: The patient's daughter has made an additional call concerning the patient's new prescription for amoxicillin-clavulanate (AUGMENTIN) 875-125 MG tablet [[id number]]  ? ?The patient would like to know if the medication is available in liquid form, which would make it easier for the patient to take  ? ?Please contact further when possible ?

## 2021-07-15 NOTE — Telephone Encounter (Signed)
Daughter Dory Larsen calling to advise pt cannot take the amoxicillin-clavulanate (AUGMENTIN) 875-125 MG tablet. ? ?She states the pills are HUGE and pt will not take.  She says even if a lesser dose, pt will not take more pills. ?She says they need doxycycline . ?Would like to know if Dr Raliegh Ip will call that medication into pharmacy instead. ? ?She says pt needs to get started on asap.  ? ?[id number] - [place], [place] [address] ?

## 2021-07-22 ENCOUNTER — Ambulatory Visit: Payer: Self-pay | Admitting: *Deleted

## 2021-07-22 DIAGNOSIS — M545 Low back pain, unspecified: Secondary | ICD-10-CM

## 2021-07-22 MED ORDER — TIZANIDINE HCL 4 MG PO TABS: 4.0000 mg | ORAL_TABLET | Freq: Three times a day (TID) | ORAL | 1 refills | Status: DC | PRN

## 2021-07-22 NOTE — Telephone Encounter (Signed)
?  Summary: back pain  ? Caller would like to know if PCP can prescribe low back coccyx pain for lower back coccyx pain. Caller states patient has a pain injection appointment scheduled for 07/29/2021. Lidocaine, patches are not working and patient is in pain.  ? ?Seeking bariatrics muscle relaxer to hold patient over until appointment.   ?  ? ? ? ?Chief Complaint: daughter POA , reports patient with dementia, reports back pain worsening  ?Symptoms: low back pain  ?Frequency: chronic  ?Pertinent Negatives: Patient denies pain shooting down legs no N/T can walk but severe pain medications not effective tylenol 1000 mg BID , voltaren cream , CBD cream, no effective ?Disposition: [] ED /[x] Urgent Care (no appt availability in office) / [] Appointment(In office/virtual)/ []  [facility]/ [] Home Care/ [x] Refused Recommended Disposition /[] [place] [facility]/ []  Follow-up with PCP ?Additional Notes:  ? ?Patient's daughter requesting any pain medication to hold paitent over until appt at [facility] to get his scheduled "trigger point injection" for pain. Requesting zanaflex , flexeril or any recommendations to assist with pain throughout the weekend. No available appt until 07/26/21. Please call patient's daughter back today   ? ? ? ? ?Reason for Disposition ? [1] SEVERE back pain (e.g., excruciating, unable to do any normal activities) AND [2] not improved 2 hours after pain medicine ? ?Answer Assessment - Initial Assessment Questions ?1. ONSET: "When did the pain begin?"  ?    Ongoing pain per daughter patient has dementia  ?2. LOCATION: "Where does it hurt?" (upper, mid or lower back) ?    Low back  ?3. SEVERITY: "How bad is the pain?"  (e.g., Scale 1-10; mild, moderate, or severe) ?  - MILD (1-3): doesn't interfere with normal activities  ?  - MODERATE (4-7): interferes with normal activities or awakens from sleep  ?  - SEVERE (8-10): excruciating pain, unable to do any normal activities  ?    Pain worsening  ?4.  PATTERN: "Is the pain constant?" (e.g., yes, no; constant, intermittent)  ?    Constant , awaiting appt for injection for pain ?5. RADIATION: "Does the pain shoot into your legs or elsewhere?" ?    No  ?6. CAUSE:  "What do you think is causing the back pain?"  ?    Chronic back issues  ?7. BACK OVERUSE:  "Any recent lifting of heavy objects, strenuous work or exercise?" ?    na ?8. MEDICATIONS: "What have you taken so far for the pain?" (e.g., nothing, acetaminophen, NSAIDS) ?    Tylenol 1000 mg BID, lidocaine patches, voltaren cream, CBD cream  ?9. NEUROLOGIC SYMPTOMS: "Do you have any weakness, numbness, or problems with bowel/bladder control?" ?    Denies  ?10. OTHER SYMPTOMS: "Do you have any other symptoms?" (e.g., fever, abdominal pain, burning with urination, blood in urine) ?      Denies  ?11. PREGNANCY: "Is there any chance you are pregnant?" (e.g., yes, no; LMP) ?      na ? ?Protocols used: Back Pain-A-AH ? ?

## 2021-07-22 NOTE — Telephone Encounter (Signed)
Okay. I will send Tizanidine (generic Zanaflex) muscle relaxant. Take as prescribed for back pain / spasms. ? ?Nobie Putnam, DO ?[facility] ?07/22/2021, 2:11 PM ? ?

## 2021-07-23 ENCOUNTER — Encounter: Payer: Self-pay | Admitting: *Deleted

## 2021-07-23 NOTE — Telephone Encounter (Signed)
This encounter was created in error - please disregard.

## 2021-07-23 NOTE — Telephone Encounter (Signed)
Patient's daughter called back and message from Dr. Parks Ranger from 07/22/21 given that PCP will send Tizanidine (generic Zanaflex) muscle relaxant. Take as prescribed for back pain/ spasms. Lorie verbalized understanding. ?

## 2021-09-03 ENCOUNTER — Emergency Department
Admission: EM | Admit: 2021-09-03 | Discharge: 2021-09-14 | Disposition: A | Discharge status: Home / Self Care | Payer: Medicare Other | Attending: Emergency Medicine | Admitting: Emergency Medicine

## 2021-09-03 ENCOUNTER — Other Ambulatory Visit: Payer: Self-pay

## 2021-09-03 ENCOUNTER — Emergency Department: Payer: Medicare Other

## 2021-09-03 DIAGNOSIS — Z79899 Other long term (current) drug therapy: Secondary | ICD-10-CM | POA: Insufficient documentation

## 2021-09-03 DIAGNOSIS — F03918 Unspecified dementia, unspecified severity, with other behavioral disturbance: Secondary | ICD-10-CM | POA: Diagnosis present

## 2021-09-03 DIAGNOSIS — R6889 Other general symptoms and signs: Secondary | ICD-10-CM | POA: Diagnosis not present

## 2021-09-03 DIAGNOSIS — Z85118 Personal history of other malignant neoplasm of bronchus and lung: Secondary | ICD-10-CM | POA: Diagnosis not present

## 2021-09-03 DIAGNOSIS — L8990 Pressure ulcer of unspecified site, unspecified stage: Secondary | ICD-10-CM | POA: Insufficient documentation

## 2021-09-03 DIAGNOSIS — R4689 Other symptoms and signs involving appearance and behavior: Secondary | ICD-10-CM

## 2021-09-03 DIAGNOSIS — Z743 Need for continuous supervision: Secondary | ICD-10-CM | POA: Diagnosis not present

## 2021-09-03 DIAGNOSIS — F0390 Unspecified dementia without behavioral disturbance: Secondary | ICD-10-CM | POA: Insufficient documentation

## 2021-09-03 DIAGNOSIS — R410 Disorientation, unspecified: Secondary | ICD-10-CM | POA: Diagnosis not present

## 2021-09-03 DIAGNOSIS — R456 Violent behavior: Secondary | ICD-10-CM | POA: Diagnosis present

## 2021-09-03 DIAGNOSIS — R000 Tachycardia, unspecified: Secondary | ICD-10-CM | POA: Diagnosis not present

## 2021-09-03 LAB — CBC WITH DIFFERENTIAL/PLATELET
Abs Immature Granulocytes: 0.05 10*3/uL (ref 0.00–0.07)
Basophils Absolute: 0.1 10*3/uL (ref 0.0–0.1)
Basophils Relative: 1 %
Eosinophils Absolute: 0.2 10*3/uL (ref 0.0–0.5)
Eosinophils Relative: 2 %
HCT: 40.9 % (ref 39.0–52.0)
Hemoglobin: 13.4 g/dL (ref 13.0–17.0)
Immature Granulocytes: 0 %
Lymphocytes Relative: 9 %
Lymphs Abs: 1.1 10*3/uL (ref 0.7–4.0)
MCH: 30.5 pg (ref 26.0–34.0)
MCHC: 32.8 g/dL (ref 30.0–36.0)
MCV: 93.2 fL (ref 80.0–100.0)
Monocytes Absolute: 1 10*3/uL (ref 0.1–1.0)
Monocytes Relative: 8 %
Neutro Abs: 10.3 10*3/uL — ABNORMAL HIGH (ref 1.7–7.7)
Neutrophils Relative %: 80 %
Platelets: 249 10*3/uL (ref 150–400)
RBC: 4.39 MIL/uL (ref 4.22–5.81)
RDW: 14.6 % (ref 11.5–15.5)
WBC: 12.8 10*3/uL — ABNORMAL HIGH (ref 4.0–10.5)
nRBC: 0 % (ref 0.0–0.2)

## 2021-09-03 LAB — COMPREHENSIVE METABOLIC PANEL
ALT: 13 U/L (ref 0–44)
AST: 23 U/L (ref 15–41)
Albumin: 3.8 g/dL (ref 3.5–5.0)
Alkaline Phosphatase: 46 U/L (ref 38–126)
Anion gap: 8 (ref 5–15)
BUN: 20 mg/dL (ref 8–23)
CO2: 24 mmol/L (ref 22–32)
Calcium: 9.3 mg/dL (ref 8.9–10.3)
Chloride: 106 mmol/L (ref 98–111)
Creatinine, Ser: 1.25 mg/dL — ABNORMAL HIGH (ref 0.61–1.24)
GFR, Estimated: 55 mL/min — ABNORMAL LOW (ref >60)
Glucose, Bld: 100 mg/dL — ABNORMAL HIGH (ref 70–99)
Potassium: 3.9 mmol/L (ref 3.5–5.1)
Sodium: 138 mmol/L (ref 135–145)
Total Bilirubin: 1.7 mg/dL — ABNORMAL HIGH (ref 0.3–1.2)
Total Protein: 7.3 g/dL (ref 6.5–8.1)

## 2021-09-03 LAB — ACETAMINOPHEN LEVEL: Acetaminophen (Tylenol), Serum: <10 ug/mL — ABNORMAL LOW (ref 10–30)

## 2021-09-03 LAB — SALICYLATE LEVEL: Salicylate Lvl: <7 mg/dL — ABNORMAL LOW (ref 7.0–30.0)

## 2021-09-03 LAB — TSH: TSH: 6.007 u[IU]/mL — ABNORMAL HIGH (ref 0.350–4.500)

## 2021-09-03 LAB — TROPONIN I (HIGH SENSITIVITY)
Troponin I (High Sensitivity): 23 ng/L — ABNORMAL HIGH (ref <18)
Troponin I (High Sensitivity): 25 ng/L — ABNORMAL HIGH (ref <18)

## 2021-09-03 MED ORDER — LORAZEPAM 2 MG/ML IJ SOLN
INTRAMUSCULAR | Status: AC
  Filled 2021-09-03: qty 1

## 2021-09-03 MED ORDER — HALOPERIDOL LACTATE 5 MG/ML IJ SOLN
INTRAMUSCULAR | Status: AC
  Filled 2021-09-03: qty 1

## 2021-09-03 MED ORDER — ZIPRASIDONE MESYLATE 20 MG IM SOLR
INTRAMUSCULAR | Status: AC
  Administered 2021-09-03: 10 mg via INTRAMUSCULAR
  Filled 2021-09-03: qty 20

## 2021-09-03 MED ORDER — HYDROCORTISONE 1 % EX CREA
TOPICAL_CREAM | Freq: Two times a day (BID) | CUTANEOUS | Status: DC
  Administered 2021-09-03 – 2021-09-10 (×4): 1 via TOPICAL
  Filled 2021-09-03: qty 28

## 2021-09-03 MED ORDER — DIVALPROEX SODIUM 250 MG PO DR TAB
250.0000 mg | DELAYED_RELEASE_TABLET | Freq: Two times a day (BID) | ORAL | Status: DC
  Administered 2021-09-04 – 2021-09-14 (×17): 250 mg via ORAL
  Filled 2021-09-03 (×22): qty 1

## 2021-09-03 MED ORDER — ZIPRASIDONE MESYLATE 20 MG IM SOLR
10.0000 mg | Freq: Once | INTRAMUSCULAR | Status: AC
Start: 2021-09-03 — End: 2021-09-03

## 2021-09-03 NOTE — ED Notes (Signed)
Patient resting comfortably at this time. Dinner will be given when the patient awakens. Breaths even and unlabored.

## 2021-09-03 NOTE — ED Notes (Signed)
Pt brought by EMS for Psych eval and SW placement.

## 2021-09-03 NOTE — ED Notes (Signed)
Mertha Finders called this RN to notify that per pt's daughter zoloft works best for pt and that pt has an adverse reaction to tramadol; per LG, family not to be directly involved or updated currently in relation to pt's care; Kyl[facility]; states will let this RN know when she gets update about this.

## 2021-09-03 NOTE — ED Notes (Addendum)
Patient's daughter, Darlina Rumpf [phone] contacted. Daughter reports that she lives in [place] and that the facility have not allowed family to visit patient, and that they no longer have access to communicate with patient.  DSS has guardianship of patient per daughter. Information obtained from hospital case manager.

## 2021-09-03 NOTE — TOC Progression Note (Addendum)
Transition of Care [facility]) - Progression Note    Patient Details  Name: Timothy Noble MRN: [id number] Date of Birth: 11-03-1930  Transition of Care [facility]) CM/SW Contact  Timothy Hutching, RN Phone Number: 09/03/2021, 2:21 PM  Clinical Narrative:    [place] has legal guardianship of patient, Timothy Noble is DSS SW assigned to case, he was recently placed at [facility] but he has been acting out and they will not be allowing the patient to return.  Contact [place] with updates- [phone].        Expected Discharge Plan and Services                                                 Social Determinants of Health (SDOH) Interventions    Readmission Risk Interventions     No data to display

## 2021-09-03 NOTE — ED Notes (Signed)
Hand mits applied to both hands.

## 2021-09-03 NOTE — Consult Note (Signed)
Timothy Noble, his DSS SW, updated on medication started and not meeting criteria for inpatient geropsych, dementia diagnosis is his issue.  Waylan Boga, PMHNP

## 2021-09-03 NOTE — ED Provider Notes (Signed)
[facility] Provider Note    Event Date/Time   First MD Initiated Contact with Patient 09/03/21 1332     (approximate)   History   Aggressive Behavior   HPI  Timothy Noble is a 88 y.o. male with history of dementia, lung cancer, and GERD who presents with increased agitated behavior and combativeness.  EMS reports that he has not been taking any of his medications at his facility and has been increasingly combative.  He has also been scratching a rash on his arms, legs, and sacral area.  The patient himself reports pain to the sacrum but is unable to express any other complaints.    Physical Exam   Triage Vital Signs: ED Triage Vitals [09/03/21 1346]  Enc Vitals Group     BP      Pulse      Resp      Temp      Temp src      SpO2      Weight      Height 5\' 9"  (1.753 m)     Head Circumference      Peak Flow      Pain Score      Pain Loc      Pain Edu?      Excl. in [place]?     Most recent vital signs: Vitals:   09/03/21 1440  BP: (!) 151/97  Pulse: (!) 101  Resp: 18  Temp: 98.2 F (36.8 C)  SpO2: 95%     General: Alert, confused CV:  Good peripheral perfusion.  Resp:  Normal effort.  Abd:  No distention.  Other:  Superficial skin breakdown to the sacral area with scattered excoriations.  No erythema, induration, or abnormal warmth.  Scattered papular rash with excoriations to bilateral lower legs and on the torso, with no surrounding erythema or induration.  No palm/sole involvement.  No mucosal involvement.  Motor intact in all extremities.  Normal coordination.   ED Results / Procedures / Treatments   Labs (all labs ordered are listed, but only abnormal results are displayed) Labs Reviewed  CBC WITH DIFFERENTIAL/PLATELET - Abnormal; Notable for the following components:      Result Value   WBC 12.8 (*)    Neutro Abs 10.3 (*)    All other components within normal limits  SALICYLATE LEVEL - Abnormal; Notable for the following  components:   Salicylate Lvl <6.5 (*)    All other components within normal limits  ACETAMINOPHEN LEVEL - Abnormal; Notable for the following components:   Acetaminophen (Tylenol), Serum <10 (*)    All other components within normal limits  COMPREHENSIVE METABOLIC PANEL  URINALYSIS, ROUTINE W REFLEX MICROSCOPIC  TSH  TROPONIN I (HIGH SENSITIVITY)  TROPONIN I (HIGH SENSITIVITY)     EKG  ED ECG REPORT I, Arta Silence, the attending physician, personally viewed and interpreted this ECG.  Date: 09/03/2021 EKG Time: 1431 Rate: 103 Rhythm: NS tachycardia QRS Axis: normal Intervals: Incomplete LBBB ST/T Wave abnormalities: Nonspecific ST abnormality Narrative Interpretation: Nonspecific abnormalities with no evidence of acute ischemia   RADIOLOGY  CT head: Pending  PROCEDURES:  Critical Care performed: No  Procedures   MEDICATIONS ORDERED IN ED: Medications  hydrocortisone cream 1 % ( Topical Given 09/03/21 1444)  divalproex (DEPAKOTE) DR tablet 250 mg (has no administration in time range)  haloperidol lactate (HALDOL) 5 MG/ML injection (  Given 09/03/21 1347)  LORazepam (ATIVAN) 2 MG/ML injection (  Given 09/03/21 1347)  IMPRESSION / MDM / ASSESSMENT AND PLAN / ED COURSE  I reviewed the triage vital signs and the nursing notes.  88 year old male with PMH as noted above presents with increased agitation, combativeness, medication noncompliance at his facility.  I reviewed the past medical records.  The patient's most recent outpatient visit was on 07/14/2021 for cough and chest congestion.  He was most recently seen in urgent care last July for hearing loss and has no recent admissions or any mental health visits.  On exam the patient is alert but appears confused.  He is somewhat agitated but redirectable, however he attempts to fight staff with any attempt to touch him, turn him in the stretcher, or examine him.  Neurologic exam is nonfocal.  He has some  superficial sacral area skin breakdown with excoriations but no evidence of skin infection.  Differential diagnosis includes, but is not limited to, progression of dementia, acute delirium due to UTI, other infection, electrolyte abnormality, AKI, or other acute medical process, or less likely primary CNS cause.  Patient's presentation is most consistent with acute presentation with potential threat to life or bodily function.  We will obtain lab work-up for medical clearance, CT head, and consult psychiatry.  Due to the patient's agitation and inability to cooperate with examination I have ordered a low-dose of Haldol and Ativan to help calm him.  ----------------------------------------- 3:17 PM on 09/03/2021 -----------------------------------------  WBC count is slightly elevated.  Acetaminophen and salicylate levels are negative.  CMP and UA are still pending, as is the CT head.  I have signed the patient out to oncoming ED physician Dr. Quentin Cornwall.   FINAL CLINICAL IMPRESSION(S) / ED DIAGNOSES   Final diagnoses:  Behavior concern     Rx / DC Orders   ED Discharge Orders     None        Note:  This document was prepared using Dragon voice recognition software and may include unintentional dictation errors.    Arta Silence, MD 09/03/21 [phone]

## 2021-09-03 NOTE — ED Notes (Signed)
Pt continues to sleep; chest rise and fall noted.

## 2021-09-03 NOTE — ED Notes (Addendum)
Meriplex dressing and hydrocortisone cream applied to sacrum. Patient repositioned in bed.

## 2021-09-03 NOTE — ED Notes (Signed)
Patient back from CT.

## 2021-09-03 NOTE — ED Notes (Signed)
Snacks: Patient sleep

## 2021-09-03 NOTE — ED Notes (Signed)
Patient refused all food including applesauce when this tech attempted to feed him.

## 2021-09-03 NOTE — ED Triage Notes (Addendum)
Patient to ER via [facility] from [facility]. Staff reports that patient has not taken his medications for three days. Patient has threatened staff multiple times and they are unwilling to take him back. Patient has also been hitting and punching staff. Patient currently a ward of the state.  Patient received 5mg  IM haldol and 2mg  IM versed with EMS.   Unable to obtain labs or vitals in triage due to agitation.   Patient consistently grabbing and scratching at wound present to sacral area. Patient also with rash present to entire body.

## 2021-09-03 NOTE — Consult Note (Signed)
[facility] Face-to-Face Psychiatry Consult   Reason for Consult:  agitation Referring Physician:  EDP Patient Identification: Timothy Noble MRN:  [id number] Principal Diagnosis: Dementia with behavioral disturbance ([place]) Diagnosis:  Principal Problem:   Dementia with behavioral disturbance ([place])   Total Time spent with patient: 45 minutes  Subjective:   Timothy Noble is a 88 y.o. male patient admitted with agitation.  HPI:  88 yo male with dementia and agitation who acted out as his facility who refuses to have him return.  He received PRN agitation medications from EMS.  Alert and calm on assessment, low level of anxiety noted, no agitation.  Unable to obtain history from the client due to him mumbling and inability to understand him.  Depakote started for mood stabilization.  Considering this is most likely his dementia getting worse, psychiatric admission is not warranted.  Unfortunately, it appears he will need placement into a SNF or Memory Care unit.  Past Psychiatric History: dementia  Risk to Self:  none Risk to Others:  none Prior Inpatient Therapy:  none per notes Prior Outpatient Therapy:  none  Past Medical History:  Past Medical History:  Diagnosis Date   Cancer ([place])    Dementia ([place])    GERD (gastroesophageal reflux disease)    Lung cancer ([place])     Past Surgical History:  Procedure Laterality Date   CHOLECYSTECTOMY     LUNG SURGERY Left    rotator cuff surgery     Family History:  Family History  Family history unknown: Yes   Family Psychiatric  History: none Social History:  Social History   Substance and Sexual Activity  Alcohol Use Never     Social History   Substance and Sexual Activity  Drug Use Never    Social History   Socioeconomic History   Marital status: Widowed    Spouse name: Not on file   Number of children: Not on file   Years of education: Not on file   Highest education level: Not on file  Occupational History   Not on file   Tobacco Use   Smoking status: Former    Types: Cigarettes    Quit date: 06/01/1979    Years since quitting: 42.2   Smokeless tobacco: Never  Vaping Use   Vaping Use: Never used  Substance and Sexual Activity   Alcohol use: Never   Drug use: Never   Sexual activity: Not on file  Other Topics Concern   Not on file  Social History Narrative   Not on file   Social Determinants of Health   Financial Resource Strain: Not on file  Food Insecurity: Not on file  Transportation Needs: Not on file  Physical Activity: Not on file  Stress: Not on file  Social Connections: Not on file   Additional Social History:    Allergies:   Allergies  Allergen Reactions   Alendronate Sodium    Terazosin     Other reaction(s): Dizziness   Oxycodone     Other reaction(s): Delirium    Labs:  Results for orders placed or performed during the hospital encounter of 09/03/21 (from the past 48 hour(s))  CBC with Differential     Status: Abnormal   Collection Time: 09/03/21  1:41 PM  Result Value Ref Range   WBC 12.8 (H) 4.0 - 10.5 K/uL   RBC 4.39 4.22 - 5.81 MIL/uL   Hemoglobin 13.4 13.0 - 17.0 g/dL   HCT 40.9 39.0 - 52.0 %   MCV 93.2  80.0 - 100.0 fL   MCH 30.5 26.0 - 34.0 pg   MCHC 32.8 30.0 - 36.0 g/dL   RDW 14.6 11.5 - 15.5 %   Platelets 249 150 - 400 K/uL   nRBC 0.0 0.0 - 0.2 %   Neutrophils Relative % 80 %   Neutro Abs 10.3 (H) 1.7 - 7.7 K/uL   Lymphocytes Relative 9 %   Lymphs Abs 1.1 0.7 - 4.0 K/uL   Monocytes Relative 8 %   Monocytes Absolute 1.0 0.1 - 1.0 K/uL   Eosinophils Relative 2 %   Eosinophils Absolute 0.2 0.0 - 0.5 K/uL   Basophils Relative 1 %   Basophils Absolute 0.1 0.0 - 0.1 K/uL   Immature Granulocytes 0 %   Abs Immature Granulocytes 0.05 0.00 - 0.07 K/uL    Comment: Performed at [facility], [address] [place], [place] [address]  Salicylate level     Status: Abnormal   Collection Time: 09/03/21  1:41 PM  Result Value Ref Range   Salicylate  Lvl <7.9 (L) 7.0 - 30.0 mg/dL    Comment: Performed at [facility], [address] [place], [place] [address]  Acetaminophen level     Status: Abnormal   Collection Time: 09/03/21  1:41 PM  Result Value Ref Range   Acetaminophen (Tylenol), Serum <10 (L) 10 - 30 ug/mL    Comment: (NOTE) Therapeutic concentrations vary significantly. A range of 10-30 ug/mL  may be an effective concentration for many patients. However, some  are best treated at concentrations outside of this range. Acetaminophen concentrations >150 ug/mL at 4 hours after ingestion  and >50 ug/mL at 12 hours after ingestion are often associated with  toxic reactions.  Performed at [facility], [address], [place], [place] [address]     Current Facility-Administered Medications  Medication Dose Route Frequency Provider Last Rate Last Admin   hydrocortisone cream 1 %   Topical BID Arta Silence, MD   Given at 09/03/21 1444   Current Outpatient Medications  Medication Sig Dispense Refill   acetaminophen (TYLENOL) 325 MG tablet TAKE THREE TABLETS BY MOUTH EVERY 8 HOURS AS NEEDED FOR PAIN OR FEVER EACH TABLET CONTAINS 325MG  ACETAMINOPHEN (TYLENOL,APAP). MAXIMUM DAILY RECOMMENDED DOSE IS 3000MG      ascorbic acid (VITAMIN C) 500 MG tablet Take 1 tablet by mouth daily.     budesonide-formoterol (SYMBICORT) 160-4.5 MCG/ACT inhaler Inhale into the lungs. (Patient not taking: Reported on 03/30/2021)     calcium-vitamin D (OSCAL WITH D) 500-200 MG-UNIT TABS tablet Take 1 tablet by mouth daily.     cetirizine (ZYRTEC) 10 MG tablet Take 10 mg by mouth daily.     clotrimazole-betamethasone (LOTRISONE) cream Apply 1-2 times a day. 30 g 1   diclofenac Sodium (VOLTAREN) 1 % GEL APPLY 2 GRAMS TOPICALLY FOUR TIMES A DAY AS DIRECTED     finasteride (PROSCAR) 5 MG tablet Take 5 mg by mouth daily.     fluticasone (FLONASE) 50 MCG/ACT nasal spray 2 sprays by Each Nare route two (2) times a day as needed for  rhinitis.     gabapentin (NEURONTIN) 300 MG capsule Take 300 mg by mouth 2 (two) times daily.     guaifenesin (HUMIBID E) 400 MG TABS tablet Take 1 tablet by mouth every 6 (six) hours as needed.     lidocaine (LIDODERM) 5 % APPLY 1 PATCH TO SKIN EVERY DAY APPLY ONCE DAILY FOR UP TO 12 HOURS (12 HOURS ON THEN REMOVE FOR  12 HOURS)     omeprazole (PRILOSEC) 20 MG capsule Take by mouth.     tamsulosin (FLOMAX) 0.4 MG CAPS capsule Take 0.4 mg by mouth.     tiZANidine (ZANAFLEX) 4 MG tablet Take 1 tablet (4 mg total) by mouth every 8 (eight) hours as needed for muscle spasms. 30 tablet 1   VITAMIN D, CHOLECALCIFEROL, PO Take 1 tablet by mouth daily.      Musculoskeletal: Strength & Muscle Tone: within normal limits Gait & Station: did not witness Patient leans: N/A  Psychiatric Specialty Exam: Physical Exam Vitals and nursing note reviewed.  Constitutional:      Appearance: Normal appearance.  HENT:     Head: Normocephalic.     Nose: Nose normal.  Pulmonary:     Effort: Pulmonary effort is normal.  Musculoskeletal:     Cervical back: Normal range of motion.  Neurological:     General: No focal deficit present.     Mental Status: He is alert.  Psychiatric:        Attention and Perception: Attention and perception normal.        Mood and Affect: Mood is anxious.        Behavior: Behavior is agitated.        Cognition and Memory: Cognition is impaired. Memory is impaired.        Judgment: Judgment is impulsive.     Comments: Difficult to understand     Review of Systems  Psychiatric/Behavioral:  Positive for memory loss. The patient is nervous/anxious.   All other systems reviewed and are negative.   Blood pressure (!) 151/97, pulse (!) 101, temperature 98.2 F (36.8 C), temperature source Oral, resp. rate 18, height 5\' 9"  (1.753 m), SpO2 95 %.Body mass index is 23.69 kg/m.  General Appearance: Casual  Eye Contact:  Fair  Speech:  Garbled  Volume:  Decreased  Mood:  Anxious   Affect:  Blunt  Thought Process:  unable to assess, UTA  Orientation:  Other:  person  Thought Content:  UTA  Suicidal Thoughts:  no threat to self  Homicidal Thoughts:  calm  Memory:  UTA  Judgement:  Impaired  Insight:   UTA  Psychomotor Activity:  Decreased  Concentration:  UTA  Recall:  [employer] of Knowledge:  UTA  Language:  Poor  Akathisia:  UTA  Handed:  Right  AIMS (if indicated):     Assets:  Leisure Time Resilience  ADL's:  Impaired  Cognition:  Impaired,  Moderate  Sleep:        Physical Exam: Physical Exam Vitals and nursing note reviewed.  Constitutional:      Appearance: Normal appearance.  HENT:     Head: Normocephalic.     Nose: Nose normal.  Pulmonary:     Effort: Pulmonary effort is normal.  Musculoskeletal:     Cervical back: Normal range of motion.  Neurological:     General: No focal deficit present.     Mental Status: He is alert.  Psychiatric:        Attention and Perception: Attention and perception normal.        Mood and Affect: Mood is anxious.        Behavior: Behavior is agitated.        Cognition and Memory: Cognition is impaired. Memory is impaired.        Judgment: Judgment is impulsive.     Comments: Difficult to understand    Review of Systems  Psychiatric/Behavioral:  Positive for memory loss. The patient is nervous/anxious.   All other systems reviewed and are negative.  Blood pressure (!) 151/97, pulse (!) 101, temperature 98.2 F (36.8 C), temperature source Oral, resp. rate 18, height 5\' 9"  (1.753 m), SpO2 95 %. Body mass index is 23.69 kg/m.  Treatment Plan Summary: Dementia with behavioral disturbance: STarted Depakote 250 mg BID  Disposition: No evidence of imminent risk to self or others at present.   Patient does not meet criteria for psychiatric inpatient admission.  Waylan Boga, NP 09/03/2021 3:12 PM

## 2021-09-03 NOTE — ED Notes (Addendum)
Patient dressed out with this RN, emma EDT, and male security presence, belongings include: Red pin Blue shirt White undershirt Boxers Black cap  Bed sheets

## 2021-09-04 LAB — CBC WITH DIFFERENTIAL/PLATELET
Abs Immature Granulocytes: 0.06 10*3/uL (ref 0.00–0.07)
Basophils Absolute: 0.1 10*3/uL (ref 0.0–0.1)
Basophils Relative: 1 %
Eosinophils Absolute: 0.3 10*3/uL (ref 0.0–0.5)
Eosinophils Relative: 3 %
HCT: 42.7 % (ref 39.0–52.0)
Hemoglobin: 14 g/dL (ref 13.0–17.0)
Immature Granulocytes: 1 %
Lymphocytes Relative: 14 %
Lymphs Abs: 1.7 10*3/uL (ref 0.7–4.0)
MCH: 30.2 pg (ref 26.0–34.0)
MCHC: 32.8 g/dL (ref 30.0–36.0)
MCV: 92 fL (ref 80.0–100.0)
Monocytes Absolute: 1.2 10*3/uL — ABNORMAL HIGH (ref 0.1–1.0)
Monocytes Relative: 10 %
Neutro Abs: 8.5 10*3/uL — ABNORMAL HIGH (ref 1.7–7.7)
Neutrophils Relative %: 71 %
Platelets: 272 10*3/uL (ref 150–400)
RBC: 4.64 MIL/uL (ref 4.22–5.81)
RDW: 14.6 % (ref 11.5–15.5)
WBC: 11.9 10*3/uL — ABNORMAL HIGH (ref 4.0–10.5)
nRBC: 0 % (ref 0.0–0.2)

## 2021-09-04 LAB — URINALYSIS, ROUTINE W REFLEX MICROSCOPIC
Bacteria, UA: NONE SEEN
Bilirubin Urine: NEGATIVE
Glucose, UA: NEGATIVE mg/dL
Ketones, ur: 20 mg/dL — AB
Leukocytes,Ua: NEGATIVE
Nitrite: NEGATIVE
Protein, ur: NEGATIVE mg/dL
Specific Gravity, Urine: 1.02 (ref 1.005–1.030)
pH: 5 (ref 5.0–8.0)

## 2021-09-04 LAB — BASIC METABOLIC PANEL
Anion gap: 13 (ref 5–15)
BUN: 20 mg/dL (ref 8–23)
CO2: 19 mmol/L — ABNORMAL LOW (ref 22–32)
Calcium: 9.5 mg/dL (ref 8.9–10.3)
Chloride: 104 mmol/L (ref 98–111)
Creatinine, Ser: 1.1 mg/dL (ref 0.61–1.24)
GFR, Estimated: >60 mL/min (ref >60)
Glucose, Bld: 98 mg/dL (ref 70–99)
Potassium: 3.9 mmol/L (ref 3.5–5.1)
Sodium: 136 mmol/L (ref 135–145)

## 2021-09-04 LAB — T4, FREE: Free T4: 1.31 ng/dL — ABNORMAL HIGH (ref 0.61–1.12)

## 2021-09-04 LAB — VALPROIC ACID LEVEL: Valproic Acid Lvl: <10 ug/mL — ABNORMAL LOW (ref 50.0–100.0)

## 2021-09-04 MED ORDER — SENNOSIDES-DOCUSATE SODIUM 8.6-50 MG PO TABS: 1.0000 | ORAL_TABLET | Freq: Every day | ORAL | Status: DC | PRN

## 2021-09-04 MED ORDER — UMECLIDINIUM BROMIDE 62.5 MCG/ACT IN AEPB
1.0000 | INHALATION_SPRAY | Freq: Every day | RESPIRATORY_TRACT | Status: DC
  Administered 2021-09-08 – 2021-09-10 (×3): 1 via RESPIRATORY_TRACT
  Filled 2021-09-04: qty 7

## 2021-09-04 MED ORDER — SODIUM CHLORIDE 0.9 % IV BOLUS
500.0000 mL | Freq: Once | INTRAVENOUS | Status: AC
  Administered 2021-09-04: 500 mL via INTRAVENOUS

## 2021-09-04 MED ORDER — ZIPRASIDONE MESYLATE 20 MG IM SOLR: 10.0000 mg | Freq: Once | INTRAMUSCULAR | Status: AC

## 2021-09-04 MED ORDER — MELATONIN 5 MG PO TABS
5.0000 mg | ORAL_TABLET | Freq: Every day | ORAL | Status: DC
  Administered 2021-09-05 – 2021-09-13 (×9): 5 mg via ORAL
  Filled 2021-09-04 (×9): qty 1

## 2021-09-04 MED ORDER — TIZANIDINE HCL 2 MG PO TABS
4.0000 mg | ORAL_TABLET | Freq: Three times a day (TID) | ORAL | Status: DC | PRN
Start: 2021-09-04 — End: 2021-09-14

## 2021-09-04 MED ORDER — SERTRALINE HCL 20 MG/ML PO CONC
50.0000 mg | Freq: Every day | ORAL | Status: DC
  Administered 2021-09-04 – 2021-09-14 (×9): 50 mg via ORAL
  Filled 2021-09-04 (×11): qty 2.5

## 2021-09-04 MED ORDER — OLANZAPINE 5 MG PO TBDP: 5.0000 mg | ORAL_TABLET | Freq: Two times a day (BID) | ORAL | Status: DC | PRN

## 2021-09-04 MED ORDER — FINASTERIDE 5 MG PO TABS
5.0000 mg | ORAL_TABLET | Freq: Every day | ORAL | Status: DC
  Administered 2021-09-05 – 2021-09-14 (×7): 5 mg via ORAL
  Filled 2021-09-04 (×11): qty 1

## 2021-09-04 MED ORDER — DIPHENHYDRAMINE HCL 12.5 MG/5ML PO ELIX
25.0000 mg | ORAL_SOLUTION | Freq: Every evening | ORAL | Status: DC | PRN
  Filled 2021-09-04: qty 10

## 2021-09-04 MED ORDER — OLANZAPINE 10 MG IM SOLR
5.0000 mg | Freq: Two times a day (BID) | INTRAMUSCULAR | Status: DC
  Administered 2021-09-04: 5 mg via INTRAMUSCULAR
  Filled 2021-09-04: qty 10

## 2021-09-04 MED ORDER — ZIPRASIDONE MESYLATE 20 MG IM SOLR
INTRAMUSCULAR | Status: AC
  Administered 2021-09-04: 10 mg via INTRAMUSCULAR
  Filled 2021-09-04: qty 20

## 2021-09-04 MED ORDER — DIPHENHYDRAMINE HCL 50 MG/ML IJ SOLN: 25.0000 mg | Freq: Every evening | INTRAMUSCULAR | Status: DC | PRN

## 2021-09-04 MED ORDER — HALOPERIDOL LACTATE 2 MG/ML PO CONC
2.0000 mg | Freq: Two times a day (BID) | ORAL | Status: DC
  Administered 2021-09-04: 2 mg via ORAL
  Filled 2021-09-04: qty 1

## 2021-09-04 MED ORDER — TIOTROPIUM BROMIDE MONOHYDRATE 1.25 MCG/ACT IN AERS
2.0000 | INHALATION_SPRAY | Freq: Every morning | RESPIRATORY_TRACT | Status: DC
Start: 2021-09-04 — End: 2021-09-04

## 2021-09-04 MED ORDER — GABAPENTIN 300 MG PO CAPS
400.0000 mg | ORAL_CAPSULE | Freq: Three times a day (TID) | ORAL | Status: DC
  Administered 2021-09-04 – 2021-09-14 (×24): 400 mg via ORAL
  Filled 2021-09-04 (×27): qty 1

## 2021-09-04 MED ORDER — POLYETHYLENE GLYCOL 3350 17 G PO PACK
17.0000 g | PACK | Freq: Every day | ORAL | Status: DC
  Administered 2021-09-08 – 2021-09-13 (×5): 17 g via ORAL
  Filled 2021-09-04 (×7): qty 1

## 2021-09-04 MED ORDER — DULOXETINE HCL 30 MG PO CPEP
30.0000 mg | ORAL_CAPSULE | Freq: Every day | ORAL | Status: DC
  Administered 2021-09-04 – 2021-09-14 (×7): 30 mg via ORAL
  Filled 2021-09-04 (×11): qty 1

## 2021-09-04 MED ORDER — BUSPIRONE HCL 5 MG PO TABS
10.0000 mg | ORAL_TABLET | Freq: Two times a day (BID) | ORAL | Status: DC
  Administered 2021-09-04 – 2021-09-14 (×17): 10 mg via ORAL
  Filled 2021-09-04 (×21): qty 2

## 2021-09-04 MED ORDER — PANTOPRAZOLE SODIUM 40 MG PO TBEC
40.0000 mg | DELAYED_RELEASE_TABLET | Freq: Every day | ORAL | Status: DC
  Administered 2021-09-05 – 2021-09-14 (×7): 40 mg via ORAL
  Filled 2021-09-04 (×11): qty 1

## 2021-09-04 MED ORDER — OLANZAPINE 10 MG IM SOLR
5.0000 mg | Freq: Two times a day (BID) | INTRAMUSCULAR | Status: DC | PRN
  Administered 2021-09-12: 5 mg via INTRAMUSCULAR
  Filled 2021-09-04 (×2): qty 10

## 2021-09-04 MED ORDER — OLANZAPINE 5 MG PO TBDP
5.0000 mg | ORAL_TABLET | Freq: Two times a day (BID) | ORAL | Status: DC
Start: 2021-09-04 — End: 2021-09-04

## 2021-09-04 MED ORDER — TAMSULOSIN HCL 0.4 MG PO CAPS
0.4000 mg | ORAL_CAPSULE | Freq: Every day | ORAL | Status: DC
  Administered 2021-09-05 – 2021-09-13 (×8): 0.4 mg via ORAL
  Filled 2021-09-04 (×9): qty 1

## 2021-09-04 MED ORDER — VITAMIN D 25 MCG (1000 UNIT) PO TABS
1000.0000 [IU] | ORAL_TABLET | Freq: Every day | ORAL | Status: DC
  Administered 2021-09-06 – 2021-09-14 (×6): 1000 [IU] via ORAL
  Filled 2021-09-04 (×11): qty 1

## 2021-09-04 NOTE — ED Notes (Addendum)
Security officers, Marine scientist, and nurse tech assisted in medical manual hold due to confusion in order to get urine. Verbal from MD [facility]. Pt in and out cathed per verbal order. Urine sent. Pt very hard to understand but was not violent during the procedure. Sitter Caitlyn at bedside at this time due to multiple attempts to get out of bed. Fall alarm on, mitts on hands, and fall pads at bedside.   Pt's brief was completely dry and was last changed at 0059. Only about 21ml of urine came with cath. MD informed.

## 2021-09-04 NOTE — ED Notes (Signed)
Pt is refusing to eat dinner meal, will try again later

## 2021-09-04 NOTE — ED Notes (Signed)
Pt continues to verbally and physically threaten and abuse staff. Sitter remains at bedside. Very hard to redirect.

## 2021-09-04 NOTE — ED Notes (Addendum)
Pt getting more and more agitated, yelling out "youre a goddamn liar" repeatedly, swinging at this rn and tech, attempting to get out of bed. Per Cinda Quest MD, give 10mg  Geodon IM.

## 2021-09-04 NOTE — ED Notes (Signed)
Pt held down by staff/nurses/techs to attempt to give oral meds in applesauce. Pt spit back at staff but did swallow most of the documented meds. Some spit back our partially disintegrated. MD and Romero Belling NP aware.

## 2021-09-04 NOTE — ED Notes (Signed)
Pt calming down at this time. NAD noted, respirations even and unlabored

## 2021-09-04 NOTE — ED Provider Notes (Signed)
Emergency Medicine Observation Re-evaluation Note  Timothy Noble is a 88 y.o. male, seen on rounds today.   Physical Exam  BP (!) 150/98   Pulse 99   Temp 98.2 F (36.8 C) (Oral)   Resp 17   Ht 5\' 9"  (1.753 m)   SpO2 96%   BMI 23.69 kg/m  Physical Exam General: Patient resting comfortably in bed no Lungs: Patient not in respiratory distress Psych: Patient not combative  ED Course / MDM  EKG: Plan  Current plan is for social work placement.  We will have to watch this patient's blood pressure as well.  Somewhat elevated.Mortimer Fries Leventhal is not under involuntary commitment.     Nena Polio, MD 09/04/21 [phone]

## 2021-09-04 NOTE — ED Notes (Signed)
Called [facility] to see if top dentures are present. Pt unable to eat or talk well without them. Facility to call RN back.

## 2021-09-04 NOTE — ED Notes (Signed)
Pt urinated on self. Pt cleaned up. Linens changed, brief changed, and pants changed. Pt not allowing this rn and tech to pull up pants or put on shirt

## 2021-09-04 NOTE — ED Provider Notes (Addendum)
8:26 AM Assumed care for off going team.   Blood pressure (!) 150/98, pulse 99, temperature 98.2 F (36.8 C), temperature source Oral, resp. rate 17, height 5\' 9"  (1.753 m), SpO2 96 %.  See their HPI for full report but in brief patient has a history of dementia due to agitated behavior and combativeness sent to the emergency room.  CBC shows downtrending white count patient remains afebrile and urine without evidence of any UTI.  Thyroid levels are slightly abnormal with some mild hyperthyroidism but doubt that this is contributing to his altered mental status.  No evidence of thyroid storm.  Medications were reviewed by pharmacy and these were ordered.  I will appears that patient was possibly started on sertraline but patient is already on Cymbalta.  We will hold off on starting this at this time and add on his other medications including Haldol buspirone, Cymbalta to see if this helps patient's agitation.  Discussed with Gust Rung who is helping to change pysch meds around.        Vanessa [place], MD 09/04/21 1246

## 2021-09-05 LAB — THYROID PANEL WITH TSH
Free Thyroxine Index: 2.4 (ref 1.2–4.9)
T3 Uptake Ratio: 28 % (ref 24–39)
T4, Total: 8.5 ug/dL (ref 4.5–12.0)
TSH: 5.09 u[IU]/mL — ABNORMAL HIGH (ref 0.450–4.500)

## 2021-09-05 NOTE — ED Notes (Signed)
Pt only ate a few bites of lunch.

## 2021-09-05 NOTE — ED Notes (Signed)
Attempted to feed PT breakfast but PT refused to eat. RN made aware.

## 2021-09-05 NOTE — ED Notes (Signed)
Patient was soiled. Patient was cleaned. Clean diaper and tuck placed. Placed blankets back over patient.

## 2021-09-05 NOTE — ED Notes (Signed)
Pt has on clean brief.  Cream applied to sacrum and new mepilex applied to sacrum with help of Ariel RN and John EDT.

## 2021-09-05 NOTE — ED Notes (Signed)
Clean brief placed on patient.  Pt turned onto his right side to help decrease pressure / pain on sacrum.

## 2021-09-05 NOTE — ED Notes (Signed)
VOL/pending placement 

## 2021-09-05 NOTE — ED Notes (Signed)
Pt's son here to visit with patient.

## 2021-09-05 NOTE — ED Notes (Signed)
Patient depends with urine.  Patient cleansed and clean depends placed.

## 2021-09-05 NOTE — ED Provider Notes (Signed)
Emergency Medicine Observation Re-evaluation Note  Timothy Noble is a 88 y.o. male, seen on rounds today.  Pt initially presented to the ED for complaints of Aggressive Behavior Currently, the patient is resting.  Physical Exam  BP (!) 161/99 (BP Location: Left Arm)   Pulse (!) 105   Temp 98.3 F (36.8 C)   Resp 18   Ht 5\' 9"  (1.753 m)   SpO2 95%   BMI 23.69 kg/m  Physical Exam Gen: No acute distress  Resp: Normal rise and fall of chest Neuro: Moving all four extremities Psych: Resting currently, calm and cooperative when awake    ED Course / MDM  EKG:EKG Interpretation  Date/Time:  Friday September 03 2021 14:31:05 EDT Ventricular Rate:  103 PR Interval:  160 QRS Duration: 118 QT Interval:  380 QTC Calculation: 497 R Axis:   47 Text Interpretation: Sinus tachycardia Incomplete left bundle branch block Minimal voltage criteria for LVH, may be normal variant ( Cornell product ) ST & T wave abnormality, consider inferolateral ischemia Abnormal ECG No previous ECGs available Confirmed by UNCONFIRMED, DOCTOR ([id number]), editor Mel Almond, Tammy [phone]) on 09/03/2021 3:02:01 PM  I have reviewed the labs performed to date as well as medications administered while in observation.  Recent changes in the last 24 hours include no acute events overnight.  Plan  Current plan is for placement.  It appears he has been psychiatrically cleared but he continues to be intermittently aggressive.  History of dementia.  Timothy Noble is not under involuntary commitment.     Phong Isenberg, Delice Bison, DO 09/05/21 0222

## 2021-09-05 NOTE — ED Notes (Signed)
Pt repositioned onto his right side.

## 2021-09-05 NOTE — ED Notes (Signed)
Pt ate about four spoons of applesauce and took a few sips of water.

## 2021-09-06 NOTE — NC FL2 (Signed)
[place] LEVEL OF CARE SCREENING TOOL     IDENTIFICATION  Patient Name: Timothy Noble Birthdate: 20-Jul-1930 Sex: male Admission Date (Current Location): 09/03/2021  [place] and [place] Number:  Engineering geologist and Address:  [facility], [address] [place], [place] [address]      Provider Number: [id number]  Attending Physician Name and Address:  No att. providers found  Relative Name and Phone Number:  [place] DSS Guardian [phone]    Current Level of Care: Other (Comment) (Boarder in ED) Recommended Level of Care: Memory Care, [place] Prior Approval Number:    Date Approved/Denied:   PASRR Number: pending  Discharge Plan: SNF    Current Diagnoses: Patient Active Problem List   Diagnosis Date Noted   Osteoporosis 03/03/2021   Dementia with behavioral disturbance ([place]) 03/03/2021   BPH (benign prostatic hyperplasia) 02/17/2020   HOH (hard of hearing) 02/17/2020   Oropharyngeal dysphagia 02/05/2016    Orientation RESPIRATION BLADDER Height & Weight     Self  Normal Incontinent Weight:   Height:  5\' 9"  (175.3 cm)  BEHAVIORAL SYMPTOMS/MOOD NEUROLOGICAL BOWEL NUTRITION STATUS  Verbally abusive   Incontinent Diet (Dysphagia diet 2)  AMBULATORY STATUS COMMUNICATION OF NEEDS Skin   Extensive Assist Verbally Normal                       Personal Care Assistance Level of Assistance  Bathing, Feeding, Dressing Bathing Assistance: Maximum assistance Feeding assistance: Maximum assistance Dressing Assistance: Maximum assistance     Functional Limitations Info  Sight, Hearing, Speech Sight Info: Adequate Hearing Info: Adequate Speech Info: Adequate    SPECIAL CARE FACTORS FREQUENCY                       Contractures Contractures Info: Not present    Additional Factors Info  Code Status, Allergies Code Status Info: Full Allergies Info: Alendronate, terzzosin, oxycodone            Current Medications (09/06/2021):  This is the current hospital active medication list Current Facility-Administered Medications  Medication Dose Route Frequency Provider Last Rate Last Admin   busPIRone (BUSPAR) tablet 10 mg  10 mg Oral BID Vanessa [place], MD   10 mg at 09/06/21 1056   cholecalciferol (VITAMIN D3) tablet 1,000 Units  1,000 Units Oral Daily Vanessa [place], MD   1,000 Units at 09/06/21 1056   diphenhydrAMINE (BENADRYL) 12.5 MG/5ML elixir 25 mg  25 mg Oral QHS PRN Patrecia Pour, NP       Or   diphenhydrAMINE (BENADRYL) injection 25 mg  25 mg Intravenous QHS PRN Patrecia Pour, NP       divalproex (DEPAKOTE) DR tablet 250 mg  250 mg Oral Q12H Patrecia Pour, NP   250 mg at 09/06/21 1054   DULoxetine (CYMBALTA) DR capsule 30 mg  30 mg Oral Daily Vanessa [place], MD   30 mg at 09/06/21 1055   finasteride (PROSCAR) tablet 5 mg  5 mg Oral Daily Vanessa [place], MD   5 mg at 09/06/21 1055   gabapentin (NEURONTIN) capsule 400 mg  400 mg Oral TID Vanessa [place], MD   400 mg at 09/06/21 1517   hydrocortisone cream 1 %   Topical BID Arta Silence, MD   Given at 09/06/21 1057   melatonin tablet 5 mg  5 mg Oral QHS Vanessa [place], MD   5  mg at 09/05/21 2154   OLANZapine zydis (ZYPREXA) disintegrating tablet 5 mg  5 mg Oral BID PRN Patrecia Pour, NP       Or   OLANZapine (ZYPREXA) injection 5 mg  5 mg Intramuscular BID PRN Patrecia Pour, NP       pantoprazole (PROTONIX) EC tablet 40 mg  40 mg Oral Daily Vanessa [place], MD   40 mg at 09/06/21 1055   polyethylene glycol (MIRALAX / GLYCOLAX) packet 17 g  17 g Oral Daily Vanessa [place], MD       senna-docusate (Senokot-S) tablet 1 tablet  1 tablet Oral Daily PRN Vanessa [place], MD       sertraline (ZOLOFT) 20 MG/ML concentrated solution 50 mg  50 mg Oral Daily Patrecia Pour, NP   50 mg at 09/06/21 1057   tamsulosin (FLOMAX) capsule 0.4 mg  0.4 mg Oral QHS Vanessa [place], MD   0.4 mg at 09/05/21 2206   tiZANidine (ZANAFLEX) tablet 4  mg  4 mg Oral Q8H PRN Vanessa [place], MD       umeclidinium bromide (INCRUSE ELLIPTA) 62.5 MCG/ACT 1 puff  1 puff Inhalation Daily Beers, Shanon Brow, [facility]       Current Outpatient Medications  Medication Sig Dispense Refill   albuterol (PROVENTIL) (2.5 MG/3ML) 0.083% nebulizer solution Take 2.5 mg by nebulization every 6 (six) hours as needed for wheezing or shortness of breath.     busPIRone (BUSPAR) 10 MG tablet Take 10 mg by mouth 2 (two) times daily.     diclofenac Sodium (VOLTAREN) 1 % GEL APPLY 2 GRAMS TOPICALLY FOUR TIMES A DAY AS DIRECTED     DULoxetine (CYMBALTA) 30 MG capsule Take 30 mg by mouth daily.     finasteride (PROSCAR) 5 MG tablet Take 5 mg by mouth daily.     gabapentin (NEURONTIN) 400 MG capsule Take 400 mg by mouth 3 (three) times daily.     haloperidol (HALDOL) 2 MG/ML solution Take 2 mg by mouth 2 (two) times daily.     melatonin 3 MG TABS tablet Take 3 mg by mouth at bedtime.     naproxen (NAPROSYN) 500 MG tablet Take 500 mg by mouth daily as needed for mild pain.     omeprazole (PRILOSEC) 20 MG capsule Take 20 mg by mouth daily before breakfast.     polyethylene glycol (MIRALAX / GLYCOLAX) 17 g packet Take 17 g by mouth daily.     senna-docusate (SENOKOT-S) 8.6-50 MG tablet Take 1 tablet by mouth daily as needed for mild constipation.     tamsulosin (FLOMAX) 0.4 MG CAPS capsule Take 0.4 mg by mouth at bedtime.     Tiotropium Bromide Monohydrate (SPIRIVA RESPIMAT) 1.25 MCG/ACT AERS Inhale 2 each into the lungs in the morning.     VITAMIN D, CHOLECALCIFEROL, PO Take 1,000 Units by mouth daily.     acetaminophen (TYLENOL) 325 MG tablet TAKE THREE TABLETS BY MOUTH EVERY 8 HOURS AS NEEDED FOR PAIN OR FEVER EACH TABLET CONTAINS 325MG  ACETAMINOPHEN (TYLENOL,APAP). MAXIMUM DAILY RECOMMENDED DOSE IS 3000MG      ascorbic acid (VITAMIN C) 500 MG tablet Take 1 tablet by mouth daily. (Patient not taking: Reported on 09/04/2021)     budesonide-formoterol (SYMBICORT) 160-4.5 MCG/ACT  inhaler Inhale into the lungs. (Patient not taking: Reported on 03/30/2021)     calcium-vitamin D (OSCAL WITH D) 500-200 MG-UNIT TABS tablet Take 1 tablet by mouth daily. (Patient not taking: Reported on 09/04/2021)  cetirizine (ZYRTEC) 10 MG tablet Take 10 mg by mouth daily. (Patient not taking: Reported on 09/04/2021)     clotrimazole-betamethasone (LOTRISONE) cream Apply 1-2 times a day. 30 g 1   fluticasone (FLONASE) 50 MCG/ACT nasal spray 2 sprays by Each Nare route two (2) times a day as needed for rhinitis.     guaifenesin (HUMIBID E) 400 MG TABS tablet Take 1 tablet by mouth every 6 (six) hours as needed.     lidocaine (LIDODERM) 5 % APPLY 1 PATCH TO SKIN EVERY DAY APPLY ONCE DAILY FOR UP TO 12 HOURS (12 HOURS ON THEN REMOVE FOR 12 HOURS)     sertraline (ZOLOFT) 20 MG/ML concentrated solution Take 50-100 mLs by mouth daily. Take 2.5 mL (50 mg) by mouth daily for 7 days starting 09/02/21, then increase to 5 mL (100 mg) daily, thereafter     tiZANidine (ZANAFLEX) 4 MG tablet Take 1 tablet (4 mg total) by mouth every 8 (eight) hours as needed for muscle spasms. 30 tablet 1     Discharge Medications: Please see discharge summary for a list of discharge medications.  Relevant Imaging Results:  Relevant Lab Results:   Additional Information SS# [id number]  Shelbie Hutching, RN

## 2021-09-06 NOTE — ED Notes (Signed)
Pt with soiled brief. New brief in place and pt cleaned.

## 2021-09-06 NOTE — TOC Initial Note (Signed)
Transition of Care [facility]) - Initial/Assessment Note    Patient Details  Name: Timothy Noble MRN: [id number] Date of Birth: [birth year]  Transition of Care [facility]) CM/SW Contact:    Shelbie Hutching, RN Phone Number: 09/06/2021, 2:04 PM  Clinical Narrative:                 Reached out to legal guardian Toula Moos with [place] DSS- left her a message for return call to discuss placement options.  RNCM will start looking for SNF Memory Care.   Patient seems to be more calm over the past 2 days.   Expected Discharge Plan: Skilled Nursing Facility Barriers to Discharge: SNF Pending bed offer   Patient Goals and CMS Choice Patient states their goals for this hospitalization and ongoing recovery are:: Patient not allowed to go back to [facility] CMS Medicare.gov Compare Post Acute Care list provided to:: Legal Guardian Choice offered to / list presented to : [place] / Guardian  Expected Discharge Plan and Services Expected Discharge Plan: [place]   Discharge Planning Services: CM Consult Post Acute Care Choice: [place] Living arrangements for the past 2 months: [place], [place]                 DME Arranged: N/A DME Agency: NA       HH Arranged: NA HH Agency: NA        Prior Living Arrangements/Services Living arrangements for the past 2 months: [place], [place] Lives with:: Facility Resident Patient language and need for interpreter reviewed:: Yes Do you feel safe going back to the place where you live?: No   unable to return  Need for Family Participation in Patient Care: Yes (Comment) Care giver support system in place?: Yes (comment)   Criminal Activity/Legal Involvement Pertinent to Current Situation/Hospitalization: No - Comment as needed  Activities of Daily Living      Permission Sought/Granted Permission sought to share information with : Facility Sport and exercise psychologist,  Guardian Permission granted to share information with : Yes, Verbal Permission Granted  Share Information with NAME: Toula Moos  Permission granted to share info w AGENCY: SNF's  Permission granted to share info w Relationship: legal guardian  Permission granted to share info w Contact Information: [phone]  Emotional Assessment Appearance:: Appears stated age     Orientation: : Fluctuating Orientation (Suspected and/or reported Sundowners) Alcohol / Substance Use: Not Applicable Psych Involvement: No (comment)  Admission diagnosis:  psych eval, ems Patient Active Problem List   Diagnosis Date Noted   Osteoporosis 03/03/2021   Dementia with behavioral disturbance ([place]) 03/03/2021   BPH (benign prostatic hyperplasia) 02/17/2020   HOH (hard of hearing) 02/17/2020   Oropharyngeal dysphagia 02/05/2016   PCP:  Olin Hauser, DO Pharmacy:   [facility] [phone] [address], [place] [address] [place] [address] Phone: [phone] Fax: [phone]     Social Determinants of Health (SDOH) Interventions    Readmission Risk Interventions     No data to display

## 2021-09-06 NOTE — ED Notes (Signed)
This RN spoke with Toula Moos Legal Guardianship coordinator. She informed this RN that family states pt has adverse reactions to opioids and responds well to zoloft. [facility] stating [facility] has agreed to take pt back once medications have been reviewed. Barrie Lyme state will reach out to worker at [employer] to set up an assessment for pt possibly tomorrow. MD made aware.

## 2021-09-06 NOTE — ED Notes (Signed)
VOL  PENDING  PLACEMENT 

## 2021-09-06 NOTE — ED Notes (Signed)
Patient brief checked at this time, not wet. Patient tucked back into bed

## 2021-09-07 NOTE — ED Notes (Signed)
This Rn attempted to give pt medications. Pt kept spitting them out.

## 2021-09-07 NOTE — TOC Progression Note (Signed)
Transition of Care [facility]) - Progression Note    Patient Details  Name: Timothy Noble MRN: [id number] Date of Birth: 1930-06-09  Transition of [facility]) CM/SW Contact  Shelbie Hutching, RN Phone Number: 09/07/2021, 1:15 PM  Clinical Narrative:    RNCM spoke with Toula Moos APS- she reports that [facility] will take patient back if he has been okay on his medications.  He has been cooperative here, he worked with PT, PT recommending SNF- he is not at his baseline- he is very hard of hearing.   Someone from [facility] is coming to assess him, possibly today.   No SNF bed offers.   Expected Discharge Plan: Skilled Nursing Facility Barriers to Discharge: SNF Pending bed offer  Expected Discharge Plan and Services Expected Discharge Plan: [place]   Discharge Planning Services: CM Consult Post Acute Care Choice: [place] Living arrangements for the past 2 months: Single Family Home, [place]                 DME Arranged: N/A DME Agency: NA       HH Arranged: NA HH Agency: NA         Social Determinants of Health (SDOH) Interventions    Readmission Risk Interventions     No data to display

## 2021-09-07 NOTE — Evaluation (Signed)
Physical Therapy Evaluation Patient Details Name: Timothy Noble MRN: [id number] DOB: 10/16/1930 Today's Date: 09/07/2021  History of Present Illness  presented to ER from ALF due to increased agitation/combativeness, refusing to take medication  Clinical Impression  Patient resting in bed upon arrival to room; gown removed, safety mittens in place, generally figeting with lines/covers.  Alert and oriented to self; extremely HOH.  Pleasant and cooperative throughout; follows simple commands, but often requires gestures and hand-over-hand to initiate/complete movement due to hearing deficits.  Generally weak and deconditioned throughout all extremities; no focal weakness appreciated.  Does demonstrates clinical indicators of pain (grimacing, guarded movement) with movement to R knee (FACES 4/10); appears to have some degree of arthritic change to R knee.  Currently requiring mod assist for bed mobility; mod assist +2 for sit/stadn and standing balance with RW; mod assist +1 for single step forward/backward with RW.  Demonstrates very strong posterior bias; generally resistant to correction by therapist.  Very high risk for fall; unsafe to progress or attempt further without RW and +2 at this time. Would benefit from skilled PT to address above deficits and promote optimal return to PLOF.; recommend transition to STR upon discharge from acute hospitalization.        Recommendations for follow up therapy are one component of a multi-disciplinary discharge planning process, led by the attending physician.  Recommendations may be updated based on patient status, additional functional criteria and insurance authorization.  Follow Up Recommendations Skilled nursing-short term rehab (<3 hours/day)    Assistance Recommended at Discharge Frequent or constant Supervision/Assistance  Patient can return home with the following  A lot of help with walking and/or transfers;A lot of help with  bathing/dressing/bathroom    Equipment Recommendations    Recommendations for Other Services       Functional Status Assessment Patient has had a recent decline in their functional status and demonstrates the ability to make significant improvements in function in a reasonable and predictable amount of time.     Precautions / Restrictions Precautions Precautions: Fall Restrictions Weight Bearing Restrictions: No      Mobility  Bed Mobility Overal bed mobility: Needs Assistance Bed Mobility: Supine to Sit, Sit to Supine     Supine to sit: Mod assist Sit to supine: Mod assist   General bed mobility comments: hand-over-hand to initiate    Transfers Overall transfer level: Needs assistance Equipment used: Rolling walker (2 wheels) Transfers: Sit to/from Stand Sit to Stand: Mod assist, +2 safety/equipment           General transfer comment: hand-over-hand support for UE placement on RW and lift off from seating surface    Ambulation/Gait   Gait Distance (Feet): 1 Feet Assistive device: Rolling walker (2 wheels)         General Gait Details: single-step forward/backward, very heavy posterior lean (generally resistant to manual facilitation for correction).  Limited tolerance for R LE WBing. Very poor balance; requiring RW and +1-2 for optimal safety.  Stairs            Wheelchair Mobility    Modified Rankin (Stroke Patients Only)       Balance Overall balance assessment: Needs assistance Sitting-balance support: No upper extremity supported, Feet supported Sitting balance-Leahy Scale: Fair   Postural control: Posterior lean Standing balance support: Bilateral upper extremity supported Standing balance-Leahy Scale: Zero  Pertinent Vitals/Pain Pain Assessment Pain Assessment: No/denies pain    Home Living Family/patient expects to be discharged to:: Assisted living                         Prior Function Prior Level of Function : Needs assist             Mobility Comments: Per TOC, patient ambulatory, able to take self to/from bathroom without assist from staff at baseline.  Patient unable to effectively communicate additional information.       Hand Dominance        Extremity/Trunk Assessment   Upper Extremity Assessment Upper Extremity Assessment: Generalized weakness    Lower Extremity Assessment Lower Extremity Assessment: Generalized weakness (grossly 4-/5 throughout; generalized soreness indicated to R knee)    Cervical / Trunk Assessment Cervical / Trunk Assessment: Kyphotic  Communication   Communication: HOH  Cognition Arousal/Alertness: Awake/alert Behavior During Therapy: WFL for tasks assessed/performed Overall Cognitive Status: No family/caregiver present to determine baseline cognitive functioning                                 General Comments: Alert and oriented to self only; pleasant and cooperative. Command-following limited by extreme HOH, often requiring hand-over-hand to initiate movement        General Comments      Exercises Other Exercises Other Exercises: Rolling bilat, mod/max assist, for peri-care/hygiene (incontinent bowel smear); difficulty with purposeful grasp/release of bedrails to assist.  Hand-over-hand required throughout.   Assessment/Plan    PT Assessment Patient needs continued PT services  PT Problem List Decreased strength;Decreased range of motion;Decreased activity tolerance;Decreased balance;Decreased mobility;Decreased coordination;Decreased cognition;Decreased knowledge of use of DME;Decreased safety awareness;Decreased knowledge of precautions       PT Treatment Interventions DME instruction;Gait training;Functional mobility training;Therapeutic activities;Therapeutic exercise;Balance training;Neuromuscular re-education;Cognitive remediation;Patient/family education    PT Goals (Current  goals can be found in the Care Plan section)  Acute Rehab PT Goals Patient Stated Goal: to return to ALF (per care team) PT Goal Formulation: Patient unable to participate in goal setting Time For Goal Achievement: 09/21/21 Potential to Achieve Goals: Fair    Frequency Min 2X/week     Co-evaluation               AM-PAC PT "6 Clicks" Mobility  Outcome Measure Help needed turning from your back to your side while in a flat bed without using bedrails?: A Lot Help needed moving from lying on your back to sitting on the side of a flat bed without using bedrails?: A Lot Help needed moving to and from a bed to a chair (including a wheelchair)?: A Lot Help needed standing up from a chair using your arms (e.g., wheelchair or bedside chair)?: A Lot Help needed to walk in hospital room?: Total Help needed climbing 3-5 steps with a railing? : Total 6 Click Score: 10    End of Session Equipment Utilized During Treatment: Gait belt Activity Tolerance: Patient tolerated treatment well Patient left: in bed;with call bell/phone within reach;with bed alarm set Nurse Communication: Mobility status PT Visit Diagnosis: Muscle weakness (generalized) (M62.81);Difficulty in walking, not elsewhere classified (R26.2)    Time: 3818-2993 PT Time Calculation (min) (ACUTE ONLY): 24 min   Charges:   PT Evaluation $PT Eval Moderate Complexity: 1 Mod PT Treatments $Therapeutic Activity: 8-22 mins       Fatema Rabe H. Owens Shark, PT, DPT,  NCS 09/07/21, 4:25 PM [phone]

## 2021-09-07 NOTE — ED Notes (Signed)
Patient given dinner tray.

## 2021-09-07 NOTE — ED Notes (Signed)
RN to bedside to introduce self to pt. Pt sleeping.

## 2021-09-07 NOTE — ED Notes (Signed)
Breakfast tray given. °

## 2021-09-07 NOTE — ED Notes (Signed)
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

## 2021-09-07 NOTE — ED Notes (Addendum)
Rounding-Patient resting, offered elimination assistance, bed in lowest position with side rail up, call bell within reach

## 2021-09-07 NOTE — ED Notes (Signed)
Pt able to take meds without difficulty. Placed in dry brief and repositioned in bed to comfort.

## 2021-09-07 NOTE — ED Provider Notes (Signed)
Emergency Medicine Observation Re-evaluation Note  Timothy Noble is a 88 y.o. male, seen on rounds today.  Pt initially presented to the ED for complaints of Aggressive Behavior Currently, the patient is resting comfortably.   Physical Exam  BP 132/80   Pulse 90   Temp 97.8 F (36.6 C) (Tympanic)   Resp 14   Ht 5\' 9"  (8.889 m)   SpO2 95%   BMI 23.69 kg/m  Physical Exam General: no distress Lungs: no increased wob Psych: no agitation  ED Course / MDM  EKG:EKG Interpretation  Date/Time:  Friday September 03 2021 14:31:05 EDT Ventricular Rate:  103 PR Interval:  160 QRS Duration: 118 QT Interval:  380 QTC Calculation: 497 R Axis:   47 Text Interpretation: Sinus tachycardia Incomplete left bundle branch block Minimal voltage criteria for LVH, may be normal variant ( Cornell product ) ST & T wave abnormality, consider inferolateral ischemia Abnormal ECG No previous ECGs available Confirmed by UNCONFIRMED, DOCTOR ([id number]), editor Mel Almond, Tammy [phone]) on 09/03/2021 3:02:01 PM  I have reviewed the labs performed to date as well as medications administered while in observation.  Recent changes in the last 24 hours include none.  Plan  Current plan is for SW placement.  Timothy Noble is not under involuntary commitment.     Rada Hay, MD 09/07/21 [phone]

## 2021-09-07 NOTE — ED Notes (Signed)
Rounding-Patient asleep, respirations noted, bed in lowest position with side rail up, call bell within reach

## 2021-09-07 NOTE — ED Notes (Signed)
VOLUNTARY continues to await TOC dispo

## 2021-09-07 NOTE — ED Notes (Signed)
Rounding-Patient sleeping, respirations noted, bed in lowest position with side rail up, call bell within reach

## 2021-09-08 NOTE — ED Notes (Signed)
Pt's son at the bedside. Informed RN that pt had 4 small bites of meat and drank a lot of water. Son insinuated that it seemed like pt hasn't been given fluids for a long time. Informed son that pt was assisted by tech during breakfast, total assist with feeding, and was given fluid as much as the patient wanted. RN was present and witnessed as meds were given with apple sauce.

## 2021-09-08 NOTE — ED Notes (Signed)
Pt changed, placed in dry brief, linens changed. Repositioned for comfort.

## 2021-09-08 NOTE — ED Notes (Signed)
Pt given lunch tray and drink. Family at bedside.

## 2021-09-08 NOTE — ED Notes (Signed)
VOL/Pending Placement 

## 2021-09-08 NOTE — ED Notes (Signed)
Fed pt dinner, water provided. Pt ate about 6spoonfuls and refused to eat more.

## 2021-09-08 NOTE — ED Notes (Signed)
VOL  PENDING  PLACEMENT 

## 2021-09-08 NOTE — ED Notes (Signed)
Changed pt's diaper, repositioned and pulled up on the bed. Warm blankets provided. Attempted to repositioned pt and keep to side, pt refused states "I cannot lay like that".

## 2021-09-08 NOTE — TOC Progression Note (Signed)
Transition of Care [facility]) - Progression Note    Patient Details  Name: Timothy Noble MRN: [id number] Date of Birth: [birth year]  [facility]) CM/SW Contact  Shelbie Hutching, RN Phone Number: 09/08/2021, 4:04 PM  Clinical Narrative:    Jeralene Huff out to [facility] with APS to see if she had heard any updates.  PT recommended SNF- bed search started but no bed offers.     Expected Discharge Plan: Skilled Nursing Facility Barriers to Discharge: SNF Pending bed offer  Expected Discharge Plan and Services Expected Discharge Plan: [place]   Discharge Planning Services: CM Consult Post Acute Care Choice: [place] Living arrangements for the past 2 months: Single Family Home, [place]                 DME Arranged: N/A DME Agency: NA       HH Arranged: NA HH Agency: NA         Social Determinants of Health (SDOH) Interventions    Readmission Risk Interventions     No data to display

## 2021-09-09 DIAGNOSIS — Z85118 Personal history of other malignant neoplasm of bronchus and lung: Secondary | ICD-10-CM | POA: Diagnosis not present

## 2021-09-09 DIAGNOSIS — Z79899 Other long term (current) drug therapy: Secondary | ICD-10-CM | POA: Diagnosis not present

## 2021-09-09 NOTE — TOC Progression Note (Signed)
Transition of Care [facility]) - Progression Note    Patient Details  Name: Timothy Noble MRN: [id number] Date of Birth: 09-11-1930  [facility] Care [facility]) CM/SW Contact  Shelbie Hutching, RN Phone Number: 09/09/2021, 12:03 PM  Clinical Narrative:    [place] cannot offer a bed, no other bed offers at this time either.  Provided [facility] requested clinicals and they will review.  Barrie Lyme will check to see if they would be willing to take him back knowing that he can stand and transfer to a wheelchair.     Expected Discharge Plan: Skilled Nursing Facility Barriers to Discharge: SNF Pending bed offer  Expected Discharge Plan and Services Expected Discharge Plan: [place]   Discharge Planning Services: CM Consult Post Acute Care Choice: [place] Living arrangements for the past 2 months: Single Family Home, [place]                 DME Arranged: N/A DME Agency: NA       HH Arranged: NA HH Agency: NA         Social Determinants of Health (SDOH) Interventions    Readmission Risk Interventions     No data to display

## 2021-09-09 NOTE — ED Notes (Signed)
Attempted to feed pt his lunch, pt kept turning his face away refusing to eat.

## 2021-09-09 NOTE — ED Notes (Signed)
Pt turned onto his right side

## 2021-09-09 NOTE — ED Notes (Signed)
Pt was checked and has a dry brief; gown was changed by this RN with assistance from EDT (tricia); pt repositioned and provided water. No other concerns at this time.

## 2021-09-09 NOTE — ED Notes (Signed)
Pillow to right side, cream to sacrum, new brief , new gown

## 2021-09-09 NOTE — Progress Notes (Signed)
PT Cancellation Note  Patient Details Name: Timothy Noble MRN: [id number] DOB: 05-18-1930   Cancelled Treatment:     PT attempt. [facility] reps in room encouraging pt to participate in eating lunch and OOB activity. He remains resistant and gets slightly agitated when author attempts to encourage some OOB activity. PT will continue efforts to maximize his abilities per current POC. Cognition continues to be most limiting.     Willette Pa 09/09/2021, 2:22 PM

## 2021-09-09 NOTE — Progress Notes (Signed)
Physical Therapy Treatment Patient Details Name: Timothy Noble Check MRN: [id number] DOB: 1931/01/26 Today's Date: 09/09/2021   History of Present Illness presented to ER from ALF due to increased agitation/combativeness, refusing to take medication    PT Comments    Author was contacted by RN staff with pt requesting to get up. Pt was living at [facility] prior to admission. Reps x 2 present earlier to assess pt's abilities to return there at DC. Pt does have baseline dementia and per reps," He is like this all the time." Pt's supportive daughter is present and helpful throughout session. When pt has the desire to perform task requested of him, he does not require extensive assistance. He was able to exit R side of bed with CGA. He stood at EOB 2 x. Min assist on first attempt 2/2 to pt attempting to pull his pants up upon standing. 2nd attempt STS EOB CGA only. He proceeded to ambulate 88ft with HHA +1. Unwilling to use RW however overall did well with gait training with HHA. Author feels pt will do better in his home environment back at [facility] and will benefit from [place] while back at [facility] to maximize his independence with all ADLs.    Recommendations for follow up therapy are one component of a multi-disciplinary discharge planning process, led by the attending physician.  Recommendations may be updated based on patient status, additional functional criteria and insurance authorization.  Follow Up Recommendations  Home health PT (Author recommends pt return to ALF/memory unit ([facility]) at DC. His cognition deficits limits him more so than physical limitations. Would benefit form HHPT at memory unit to focus on improving balance and independence with ADLs.)     Assistance Recommended at Discharge Frequent or constant Supervision/Assistance  Patient can return home with the following A little help with walking and/or transfers;A little help with bathing/dressing/bathroom;Assistance with  cooking/housework;Assistance with feeding;Direct supervision/assist for medications management;Assist for transportation;Help with stairs or ramp for entrance;Direct supervision/assist for financial management   Equipment Recommendations  Other (comment);Rolling walker (2 wheels) (If pt does not have one already)       Precautions / Restrictions Precautions Precautions: Fall Restrictions Weight Bearing Restrictions: No     Mobility  Bed Mobility Overal bed mobility: Needs Assistance Bed Mobility: Supine to Sit, Sit to Supine     Supine to sit: Min guard Sit to supine: Min guard   General bed mobility comments: Once pt wanted to get OOB he was able to perform with CGA only for safety. pt was resistive to OOB earlier but agreeable currently    Transfers Overall transfer level: Needs assistance Equipment used: 1 person hand held assist Transfers: Sit to/from Stand Sit to Stand: Min guard (lowest bed height)    General transfer comment: pt stood 2 x EOB with min assist first attempt, CGA for 2nd.    Ambulation/Gait Ambulation/Gait assistance: Min assist Gait Distance (Feet): 75 Feet Assistive device: 1 person hand held assist Gait Pattern/deviations: Step-through pattern, Trunk flexed, Narrow base of support Gait velocity: decreased     General Gait Details: Pt tolerated ambulation ~ 75 ft total. cognition deficits impacts gait/mobility/transfers more so than strength/balance deficits    Balance Overall balance assessment: Needs assistance Sitting-balance support: No upper extremity supported, Feet supported Sitting balance-Leahy Scale: Fair Sitting balance - Comments: CGA while sitting EOB however no LOB   Standing balance support: Single extremity supported, During functional activity Standing balance-Leahy Scale: Fair Standing balance comment: does require +1 HHA for balance. Would recommend  RW however pt unwilling to use it this session.       Cognition  Arousal/Alertness: Awake/alert Behavior During Therapy: WFL for tasks assessed/performed Overall Cognitive Status: History of cognitive impairments - at baseline      General Comments: pt's daughter in room + 2 reps from his ALF([facility]). pt seems to be at baseline cognition per reps/daughter. " He is only going to do what he wants when he wants."               Pertinent Vitals/Pain Pain Assessment Pain Assessment: PAINAD Breathing: normal Negative Vocalization: none Facial Expression: smiling or inexpressive Body Language: relaxed Consolability: no need to console PAINAD Score: 0 Pain Location: sacral area due to slight skin breakdown Pain Descriptors / Indicators: Discomfort Pain Intervention(s): Limited activity within patient's tolerance, Monitored during session, Repositioned     PT Goals (current goals can now be found in the care plan section) Acute Rehab PT Goals Patient Stated Goal: to return to ALF (per care team and daughter request ) Progress towards PT goals: Progressing toward goals    Frequency    Min 2X/week      PT Plan Discharge plan needs to be updated       AM-PAC PT "6 Clicks" Mobility   Outcome Measure  Help needed turning from your back to your side while in a flat bed without using bedrails?: A Little Help needed moving from lying on your back to sitting on the side of a flat bed without using bedrails?: A Little Help needed moving to and from a bed to a chair (including a wheelchair)?: A Little Help needed standing up from a chair using your arms (e.g., wheelchair or bedside chair)?: A Little Help needed to walk in hospital room?: A Little Help needed climbing 3-5 steps with a railing? : A Lot 6 Click Score: 17    End of Session   Activity Tolerance: Patient tolerated treatment well;Other (comment) (limited by cognition/ STM deficits) Patient left: in bed;with call bell/phone within reach;with bed alarm set;with family/visitor  present Nurse Communication: Mobility status PT Visit Diagnosis: Muscle weakness (generalized) (M62.81);Difficulty in walking, not elsewhere classified (R26.2)     Time: 6568-1275 PT Time Calculation (min) (ACUTE ONLY): 24 min  Charges:  $Gait Training: 8-22 mins $Therapeutic Activity: 8-22 mins                     Julaine Fusi PTA 09/09/21, 4:15 PM

## 2021-09-09 NOTE — ED Provider Notes (Signed)
Today's Vitals   09/08/21 1335 09/08/21 2155 09/09/21 0616 09/09/21 0617  BP: 98/61 (!) 164/85 (!) 156/96   Pulse: 74 78 80   Resp: 16 17 20    Temp:   98.2 F (36.8 C)   TempSrc:   Oral   SpO2: 98% 98% 99%   Weight:    60.9 kg  Height:       Body mass index is 19.83 kg/m.   No acute events overnight.  Awaiting social work disposition.   Annelie Boak, Delice Bison, DO 09/09/21 [phone]

## 2021-09-10 DIAGNOSIS — Z85118 Personal history of other malignant neoplasm of bronchus and lung: Secondary | ICD-10-CM | POA: Diagnosis not present

## 2021-09-10 DIAGNOSIS — Z79899 Other long term (current) drug therapy: Secondary | ICD-10-CM | POA: Diagnosis not present

## 2021-09-10 NOTE — ED Notes (Signed)
Patients room was straightened. Patient does not want to put on his clothes at this time and is resting comfortably.

## 2021-09-10 NOTE — ED Notes (Signed)
Pt did not eat lunch

## 2021-09-10 NOTE — ED Notes (Signed)
Patient is asleep.  

## 2021-09-10 NOTE — ED Notes (Signed)
Assumed care of patient. He is asleep at this time.

## 2021-09-10 NOTE — TOC Progression Note (Signed)
Transition of Care [facility]) - Progression Note    Patient Details  Name: Timothy Noble MRN: [id number] Date of Birth: 1930-08-15  [facility]) CM/SW Contact  Shelbie Hutching, RN Phone Number: 09/10/2021, 2:36 PM  Clinical Narrative:    Received an update from [facility].  [facility].  Barrie Lyme is reaching out to [facility] to see if they will accept patient as they were going to take him previously.  RNCM has had no bed offers for SNF.     Expected Discharge Plan: Skilled Nursing Facility Barriers to Discharge: SNF Pending bed offer  Expected Discharge Plan and Services Expected Discharge Plan: [place]   Discharge Planning Services: CM Consult Post Acute Care Choice: [place] Living arrangements for the past 2 months: Single Family Home, [place]                 DME Arranged: N/A DME Agency: NA       HH Arranged: NA HH Agency: NA         Social Determinants of Health (SDOH) Interventions    Readmission Risk Interventions     No data to display

## 2021-09-10 NOTE — ED Notes (Signed)
VOL/Still Pending TOC Placement

## 2021-09-10 NOTE — ED Notes (Signed)
Pt refusing meds

## 2021-09-10 NOTE — ED Notes (Signed)
Patient was incontinent to urine. Patient was cleaned and linens were changed

## 2021-09-10 NOTE — ED Notes (Signed)
Patient given dinner tray.

## 2021-09-10 NOTE — Progress Notes (Addendum)
PT Cancellation Note  Patient Details Name: Timothy Noble MRN: [id number] DOB: [birth year]   Cancelled Treatment:     PT attempt. Pt laying nude in bed with untouched lunch dinner tray at bedside. Author attempted to encourage pt to get up and walk but pt unwilling. Proceeded to encourage pt eat but pt is unwilling. Pt's cognition greatly impacts his abilities. Recommend 24/7 assistance/supervision at DC.    Willette Pa 09/10/2021, 4:47 PM

## 2021-09-10 NOTE — ED Notes (Signed)
VOL / pending TOC placement 

## 2021-09-10 NOTE — ED Notes (Signed)
Pts wet. Cleaned and changed. Bed also

## 2021-09-10 NOTE — ED Notes (Signed)
Pt didn't eat breakfast

## 2021-09-10 NOTE — ED Notes (Signed)
Breakfast given.  

## 2021-09-11 DIAGNOSIS — Z85118 Personal history of other malignant neoplasm of bronchus and lung: Secondary | ICD-10-CM | POA: Diagnosis not present

## 2021-09-11 DIAGNOSIS — Z79899 Other long term (current) drug therapy: Secondary | ICD-10-CM | POA: Diagnosis not present

## 2021-09-11 NOTE — ED Notes (Signed)
Pt alert and oriented only to self. Pt rambles and does not appear to understand many prompts or questions.

## 2021-09-11 NOTE — ED Notes (Signed)
Pt resting in bed. Pt brief and bedding changed. Pt wiped with bath wipes.Foam pad placed on pt coccyx for reddness at this time. Pt sitting in bed, call light within reach, no needs verbalized at this time.

## 2021-09-11 NOTE — ED Notes (Signed)
Pt eating food brought in by family at this time.

## 2021-09-11 NOTE — ED Notes (Signed)
Patient laying in bed at this time, pt had various plastic cutlery in their blankets and was attempting to cut their bed sheets with is. I retrieved the silver wear and ensured no other items were in the patient bed sheets.  Pt alert and oriented only to self at this time.  Pt attempted to swat this RN away while I checked the dryness of their brief.

## 2021-09-11 NOTE — ED Notes (Signed)
Changed pt brief with assistance. PT resisting assistance. Pt ripped chux out from under themselves and refused to allow me to replace it

## 2021-09-11 NOTE — ED Notes (Signed)
Pt son in to visit with patient. Pt son asking to ambulate pt. Pt stood with assist x2 and walker and was able to shuffle a few step to chair. Pt son stating that he wants to shave the patient. This rn educated pt family about pt fall risk at this time. PT in recliner with son at chairside.

## 2021-09-11 NOTE — ED Provider Notes (Signed)
Today's Vitals   09/09/21 2026 09/10/21 0445 09/10/21 1341 09/10/21 2235  BP: (!) 145/102 130/89 (!) 141/85 (!) 190/118  Pulse: 100 89 82 98  Resp: 18 15 18 17   Temp:  97.6 F (36.4 C)  97.6 F (36.4 C)  TempSrc:  Axillary  Axillary  SpO2: 98% 93% 95% 100%  Weight:      Height:       Body mass index is 19.83 kg/m.   No acute events overnight.  Awaiting social work disposition.   Jujuan Dugo, Delice Bison, DO 09/11/21 [phone]

## 2021-09-11 NOTE — ED Notes (Addendum)
Pt unable to feed themself at this time. Pt assisted with eating and drinking.

## 2021-09-11 NOTE — ED Notes (Signed)
Patient is resting in bed. Patient declined to use the restroom and is currently dry.

## 2021-09-12 DIAGNOSIS — Z79899 Other long term (current) drug therapy: Secondary | ICD-10-CM | POA: Diagnosis not present

## 2021-09-12 DIAGNOSIS — Z85118 Personal history of other malignant neoplasm of bronchus and lung: Secondary | ICD-10-CM | POA: Diagnosis not present

## 2021-09-12 NOTE — ED Notes (Signed)
Pt becoming agitated at multiple times throughout night. Attempting to climb out of bed, attempting to swat at staff with arms, yelling.

## 2021-09-12 NOTE — ED Notes (Signed)
TOC placement

## 2021-09-12 NOTE — ED Notes (Signed)
Son at bedside brought pt some CookOut and pt eating some.

## 2021-09-12 NOTE — ED Notes (Signed)
Unable to vitals on pt, fighting this nurse. Unable to get him to take his PO meds as well. When attempt to give to him he fights this nurse and refuses to take them, will update provider.

## 2021-09-12 NOTE — ED Notes (Signed)
Pt family member at bedside.

## 2021-09-12 NOTE — ED Notes (Signed)
IT ticket put in for scanner for this room

## 2021-09-12 NOTE — ED Notes (Signed)
Pt repositioned in bed and checked if needed to be changed. Warm blanket given to pt.

## 2021-09-12 NOTE — ED Provider Notes (Signed)
Emergency Medicine Observation Re-evaluation Note  Timothy Noble is a 88 y.o. male, seen on rounds today.  Pt initially presented to the ED for complaints of Aggressive Behavior Currently, the patient is resting comfortably.  Physical Exam  BP (!) 142/93 (BP Location: Right Arm)   Pulse 82   Temp 97.9 F (36.6 C) (Axillary)   Resp 18   Ht 5\' 9"  (1.753 m)   Wt 60.9 kg   SpO2 99%   BMI 19.83 kg/m  Physical Exam General: No acute distress Cardiac: Well-perfused extremities Lungs: No respiratory distress Psych: Appropriate mood and affect  ED Course / MDM  EKG:EKG Interpretation  Date/Time:  Friday September 03 2021 14:31:05 EDT Ventricular Rate:  103 PR Interval:  160 QRS Duration: 118 QT Interval:  380 QTC Calculation: 497 R Axis:   47 Text Interpretation: Sinus tachycardia Incomplete left bundle branch block Minimal voltage criteria for LVH, may be normal variant ( Cornell product ) ST & T wave abnormality, consider inferolateral ischemia Abnormal ECG No previous ECGs available Confirmed by UNCONFIRMED, DOCTOR ([id number]), editor Mel Almond, Tammy [phone]) on 09/03/2021 3:02:01 PM  I have reviewed the labs performed to date as well as medications administered while in observation.  Recent changes in the last 24 hours include none.  Plan  Current plan is for placement.  Timothy Noble is not under involuntary commitment.     Naaman Plummer, MD 09/12/21 1535

## 2021-09-12 NOTE — ED Notes (Signed)
Attempted to give pt am meds crushed and in applesauce, pt spit out applesauce and meds.

## 2021-09-13 DIAGNOSIS — Z79899 Other long term (current) drug therapy: Secondary | ICD-10-CM | POA: Diagnosis not present

## 2021-09-13 DIAGNOSIS — Z85118 Personal history of other malignant neoplasm of bronchus and lung: Secondary | ICD-10-CM | POA: Diagnosis not present

## 2021-09-13 MED ORDER — SERTRALINE HCL 50 MG PO TABS
50.0000 mg | ORAL_TABLET | Freq: Every day | ORAL | 2 refills | Status: DC
Start: 2021-09-13 — End: 2021-12-22

## 2021-09-13 MED ORDER — BUSPIRONE HCL 10 MG PO TABS: 10.0000 mg | ORAL_TABLET | Freq: Two times a day (BID) | ORAL | 1 refills | Status: DC

## 2021-09-13 MED ORDER — PANTOPRAZOLE SODIUM 40 MG PO TBEC: 40.0000 mg | DELAYED_RELEASE_TABLET | Freq: Every day | ORAL | 1 refills | Status: AC

## 2021-09-13 MED ORDER — HALOPERIDOL 2 MG PO TABS: 2.0000 mg | ORAL_TABLET | Freq: Two times a day (BID) | ORAL | 1 refills | Status: DC

## 2021-09-13 MED ORDER — INCRUSE ELLIPTA 62.5 MCG/ACT IN AEPB: 1.0000 | INHALATION_SPRAY | Freq: Every day | RESPIRATORY_TRACT | 2 refills | Status: DC

## 2021-09-13 MED ORDER — DIVALPROEX SODIUM 250 MG PO DR TAB
250.0000 mg | DELAYED_RELEASE_TABLET | Freq: Two times a day (BID) | ORAL | 2 refills | Status: DC
Start: 2021-09-13 — End: 2021-12-21

## 2021-09-13 MED ORDER — FINASTERIDE 5 MG PO TABS: 5.0000 mg | ORAL_TABLET | Freq: Every day | ORAL | 2 refills | Status: DC

## 2021-09-13 MED ORDER — GABAPENTIN 400 MG PO CAPS: 400.0000 mg | ORAL_CAPSULE | Freq: Three times a day (TID) | ORAL | 2 refills | Status: DC

## 2021-09-13 MED ORDER — OLANZAPINE 5 MG PO TABS: 5.0000 mg | ORAL_TABLET | Freq: Two times a day (BID) | ORAL | 2 refills | Status: DC | PRN

## 2021-09-13 MED ORDER — DULOXETINE HCL 30 MG PO CPEP: 30.0000 mg | ORAL_CAPSULE | Freq: Every day | ORAL | 2 refills | Status: DC

## 2021-09-13 MED ORDER — MELATONIN 5 MG PO TABS: 5.0000 mg | ORAL_TABLET | Freq: Every day | ORAL | 1 refills | Status: DC

## 2021-09-13 MED ORDER — TAMSULOSIN HCL 0.4 MG PO CAPS: 0.4000 mg | ORAL_CAPSULE | Freq: Every day | ORAL | 1 refills | Status: DC

## 2021-09-13 NOTE — ED Notes (Signed)
Pt did not understand why his meds are being given in applesauce. This Probation officer tried explaining to him why which pt did not seem to understand. Pt refusing meds at this time.

## 2021-09-13 NOTE — ED Notes (Signed)
Pt redirected back into bed at this time

## 2021-09-13 NOTE — TOC Progression Note (Signed)
Transition of Care [facility]) - Progression Note    Patient Details  Name: Timothy Noble MRN: [id number] Date of Birth: 1930-08-26  [facility] of [facility]) CM/SW Contact  Shelbie Hutching, RN Phone Number: 09/13/2021, 3:23 PM  Clinical Narrative:    Received call from Toula Moos with [facility] will again consider accepting patient back they just want an updated MAR.  Updated med list and updated FL2 sent to [facility].     Expected Discharge Plan: Skilled Nursing Facility Barriers to Discharge: SNF Pending bed offer  Expected Discharge Plan and Services Expected Discharge Plan: [place]   Discharge Planning Services: CM Consult Post Acute Care Choice: [place] Living arrangements for the past 2 months: Single Family Home, [place]                 DME Arranged: N/A DME Agency: NA       HH Arranged: NA HH Agency: NA         Social Determinants of Health (SDOH) Interventions    Readmission Risk Interventions     No data to display

## 2021-09-13 NOTE — NC FL2 (Signed)
[place] LEVEL OF CARE SCREENING TOOL     IDENTIFICATION  Patient Name: Timothy Noble Birthdate: 01-08-1931 Sex: male Admission Date (Current Location): 09/03/2021  [place] and [place] Number:  Engineering geologist and Address:  [facility], [address] [place], [place] [address]      Provider Number: [id number]  Attending Physician Name and Address:  No att. providers found  Relative Name and Phone Number:  Toula Moos DSS Guardian [phone]    Current Level of Care: Other (Comment) ([place] in ED) Recommended Level of Care: [place] Prior Approval Number:    Date Approved/Denied:   PASRR Number: NA  Discharge Plan: Domiciliary (Rest home) (ALF)    Current Diagnoses: Patient Active Problem List   Diagnosis Date Noted   Osteoporosis 03/03/2021   Dementia with behavioral disturbance ([place]) 03/03/2021   BPH (benign prostatic hyperplasia) 02/17/2020   HOH (hard of hearing) 02/17/2020   Oropharyngeal dysphagia 02/05/2016    Orientation RESPIRATION BLADDER Height & Weight     Self  Normal Incontinent Weight: 60.9 kg Height:  5\' 9"  (175.3 cm)  BEHAVIORAL SYMPTOMS/MOOD NEUROLOGICAL BOWEL NUTRITION STATUS  Other (Comment) (intermittant agitation- redirectable)   Incontinent Diet (Dysphagia diet 2)  AMBULATORY STATUS COMMUNICATION OF NEEDS Skin   Limited Assist Verbally Normal                       Personal Care Assistance Level of Assistance  Bathing, Feeding, Dressing Bathing Assistance: Limited assistance Feeding assistance: Limited assistance Dressing Assistance: Limited assistance     Functional Limitations Info  Sight, Hearing, Speech Sight Info: Adequate Hearing Info: Adequate Speech Info: Adequate    SPECIAL CARE FACTORS FREQUENCY  PT (By licensed PT), OT (By licensed OT)     PT Frequency: Home Health or outpatient PT eval and treat 2- 3 times per week OT Frequency: Home health or  Outpatient OT eval and treat 2-3 times per week            Contractures Contractures Info: Not present    Additional Factors Info  Code Status, Allergies Code Status Info: Full Allergies Info: Alendronate, terzzosin, oxycodone           Current Medications (09/13/2021):  This is the current hospital active medication list Current Facility-Administered Medications  Medication Dose Route Frequency Provider Last Rate Last Admin   busPIRone (BUSPAR) tablet 10 mg  10 mg Oral BID Vanessa [place], MD   10 mg at 09/13/21 7169   cholecalciferol (VITAMIN D3) tablet 1,000 Units  1,000 Units Oral Daily Vanessa [name], MD   1,000 Units at 09/13/21 0944   diphenhydrAMINE (BENADRYL) 12.5 MG/5ML elixir 25 mg  25 mg Oral QHS PRN Patrecia Pour, NP       Or   diphenhydrAMINE (BENADRYL) injection 25 mg  25 mg Intravenous QHS PRN Patrecia Pour, NP       divalproex (DEPAKOTE) DR tablet 250 mg  250 mg Oral Q12H Patrecia Pour, NP   250 mg at 09/13/21 0943   DULoxetine (CYMBALTA) DR capsule 30 mg  30 mg Oral Daily Vanessa [place], MD   30 mg at 09/13/21 0944   finasteride (PROSCAR) tablet 5 mg  5 mg Oral Daily Vanessa [place], MD   5 mg at 09/13/21 0944   gabapentin (NEURONTIN) capsule 400 mg  400 mg Oral TID Vanessa [place], MD   400 mg at 09/13/21 [phone]  hydrocortisone cream 1 %   Topical BID Arta Silence, MD   Given at 09/13/21 0945   melatonin tablet 5 mg  5 mg Oral QHS Vanessa [place], MD   5 mg at 09/12/21 2123   OLANZapine zydis (ZYPREXA) disintegrating tablet 5 mg  5 mg Oral BID PRN Patrecia Pour, NP       Or   OLANZapine (ZYPREXA) injection 5 mg  5 mg Intramuscular BID PRN Patrecia Pour, NP   5 mg at 09/12/21 0231   pantoprazole (PROTONIX) EC tablet 40 mg  40 mg Oral Daily Vanessa [place], MD   40 mg at 09/13/21 0944   polyethylene glycol (MIRALAX / GLYCOLAX) packet 17 g  17 g Oral Daily Vanessa [place], MD   17 g at 09/13/21 3536   senna-docusate (Senokot-S) tablet 1 tablet  1 tablet Oral  Daily PRN Vanessa [place], MD       sertraline (ZOLOFT) 20 MG/ML concentrated solution 50 mg  50 mg Oral Daily Patrecia Pour, NP   50 mg at 09/13/21 0944   tamsulosin (FLOMAX) capsule 0.4 mg  0.4 mg Oral QHS Vanessa [place], MD   0.4 mg at 09/12/21 2123   tiZANidine (ZANAFLEX) tablet 4 mg  4 mg Oral Q8H PRN Vanessa [place], MD       umeclidinium bromide (INCRUSE ELLIPTA) 62.5 MCG/ACT 1 puff  1 puff Inhalation Daily Lorna Dibble, RPH   1 puff at 09/10/21 1004   Current Outpatient Medications  Medication Sig Dispense Refill   albuterol (PROVENTIL) (2.5 MG/3ML) 0.083% nebulizer solution Take 2.5 mg by nebulization every 6 (six) hours as needed for wheezing or shortness of breath.     busPIRone (BUSPAR) 10 MG tablet Take 10 mg by mouth 2 (two) times daily.     diclofenac Sodium (VOLTAREN) 1 % GEL APPLY 2 GRAMS TOPICALLY FOUR TIMES A DAY AS DIRECTED     DULoxetine (CYMBALTA) 30 MG capsule Take 30 mg by mouth daily.     finasteride (PROSCAR) 5 MG tablet Take 5 mg by mouth daily.     gabapentin (NEURONTIN) 400 MG capsule Take 400 mg by mouth 3 (three) times daily.     haloperidol (HALDOL) 2 MG/ML solution Take 2 mg by mouth 2 (two) times daily.     melatonin 3 MG TABS tablet Take 3 mg by mouth at bedtime.     naproxen (NAPROSYN) 500 MG tablet Take 500 mg by mouth daily as needed for mild pain.     omeprazole (PRILOSEC) 20 MG capsule Take 20 mg by mouth daily before breakfast.     polyethylene glycol (MIRALAX / GLYCOLAX) 17 g packet Take 17 g by mouth daily.     senna-docusate (SENOKOT-S) 8.6-50 MG tablet Take 1 tablet by mouth daily as needed for mild constipation.     tamsulosin (FLOMAX) 0.4 MG CAPS capsule Take 0.4 mg by mouth at bedtime.     Tiotropium Bromide Monohydrate (SPIRIVA RESPIMAT) 1.25 MCG/ACT AERS Inhale 2 each into the lungs in the morning.     VITAMIN D, CHOLECALCIFEROL, PO Take 1,000 Units by mouth daily.     acetaminophen (TYLENOL) 325 MG tablet TAKE THREE TABLETS BY MOUTH EVERY 8  HOURS AS NEEDED FOR PAIN OR FEVER EACH TABLET CONTAINS 325MG  ACETAMINOPHEN (TYLENOL,APAP). MAXIMUM DAILY RECOMMENDED DOSE IS 3000MG      ascorbic acid (VITAMIN C) 500 MG tablet Take 1 tablet by mouth daily. (Patient not taking: Reported on 09/04/2021)  budesonide-formoterol (SYMBICORT) 160-4.5 MCG/ACT inhaler Inhale into the lungs. (Patient not taking: Reported on 03/30/2021)     calcium-vitamin D (OSCAL WITH D) 500-200 MG-UNIT TABS tablet Take 1 tablet by mouth daily. (Patient not taking: Reported on 09/04/2021)     cetirizine (ZYRTEC) 10 MG tablet Take 10 mg by mouth daily. (Patient not taking: Reported on 09/04/2021)     clotrimazole-betamethasone (LOTRISONE) cream Apply 1-2 times a day. 30 g 1   fluticasone (FLONASE) 50 MCG/ACT nasal spray 2 sprays by Each Nare route two (2) times a day as needed for rhinitis.     guaifenesin (HUMIBID E) 400 MG TABS tablet Take 1 tablet by mouth every 6 (six) hours as needed.     lidocaine (LIDODERM) 5 % APPLY 1 PATCH TO SKIN EVERY DAY APPLY ONCE DAILY FOR UP TO 12 HOURS (12 HOURS ON THEN REMOVE FOR 12 HOURS)     sertraline (ZOLOFT) 20 MG/ML concentrated solution Take 50-100 mLs by mouth daily. Take 2.5 mL (50 mg) by mouth daily for 7 days starting 09/02/21, then increase to 5 mL (100 mg) daily, thereafter     tiZANidine (ZANAFLEX) 4 MG tablet Take 1 tablet (4 mg total) by mouth every 8 (eight) hours as needed for muscle spasms. 30 tablet 1     Discharge Medications: Please see discharge summary for a list of discharge medications.  Relevant Imaging Results:  Relevant Lab Results:   Additional Information SS# [id number]  Shelbie Hutching, RN

## 2021-09-13 NOTE — ED Notes (Signed)
VOL  PENDING  PLACEMENT 

## 2021-09-13 NOTE — ED Notes (Signed)
Pt refusing breakfast at this time. SW made aware of pt's refusal of meds and food.

## 2021-09-13 NOTE — ED Notes (Signed)
Pt refusing VS at this time.

## 2021-09-13 NOTE — ED Notes (Signed)
Pt brief and sheets changed at this time

## 2021-09-13 NOTE — ED Provider Notes (Signed)
-----------------------------------------   5:07 AM on 09/13/2021 -----------------------------------------   Blood pressure 133/88, pulse 89, temperature 97.9 F (36.6 C), temperature source Axillary, resp. rate 19, height 5\' 9"  (1.753 m), weight 60.9 kg, SpO2 97 %.  The patient is calm and cooperative at this time.  There have been no acute events since the last update.  Awaiting disposition plan from Social Work team.   Paulette Blanch, MD 09/13/21 [phone]

## 2021-09-13 NOTE — Progress Notes (Signed)
Physical Therapy Treatment Patient Details Name: Timothy Noble MRN: [id number] DOB: 1930-06-23 Today's Date: 09/13/2021   History of Present Illness presented to ER from ALF due to increased agitation/combativeness, refusing to take medication    PT Comments    Pt supine in bed, agreeable to therapy following encouragement by PT. PT assisted with dressing pt in both gown and pants per pt preference. He performed bed mobility with MIN A, pt pulling himself upright with BUE HHA. Standing and ambulation with CGA for safety and occasional steadying. Pt only willing to ambulate just outside his door before turning around. Linens were changed as pt stood at EOB. Pt content at end of session covered with warm blankets. Would benefit from skilled PT to address above deficits and promote optimal return to PLOF.    Recommendations for follow up therapy are one component of a multi-disciplinary discharge planning process, led by the attending physician.  Recommendations may be updated based on patient status, additional functional criteria and insurance authorization.  Follow Up Recommendations  Home health PT (Author recommends pt return to AL[facility] [place]. His cognition deficits limits him more so than physical limitations. Would benefit from HHPT at memory unit to focus on improving balance and independence with ADLs.)     Assistance Recommended at Discharge Frequent or constant Supervision/Assistance  Patient can return home with the following A little help with walking and/or transfers;A little help with bathing/dressing/bathroom;Assistance with cooking/housework;Assistance with feeding;Direct supervision/assist for medications management;Assist for transportation;Help with stairs or ramp for entrance;Direct supervision/assist for financial management   Equipment Recommendations  Other (comment);Rolling walker (2 wheels) (If pt does not have one already)    Recommendations for Other  Services       Precautions / Restrictions Precautions Precautions: Fall Restrictions Weight Bearing Restrictions: No     Mobility  Bed Mobility Overal bed mobility: Needs Assistance Bed Mobility: Supine to Sit, Sit to Supine     Supine to sit: Min assist Sit to supine: Min guard   General bed mobility comments: MIN A for bilateral HHA pt to pull himself to upright; pt returned to bed w/o physical assist    Transfers Overall transfer level: Needs assistance Equipment used: Rolling walker (2 wheels) Transfers: Sit to/from Stand Sit to Stand: Min guard           General transfer comment: x3 reps from EOB; pt stood using RW for UE support    Ambulation/Gait Ambulation/Gait assistance: Min guard Gait Distance (Feet): 30 Feet Assistive device: Rolling walker (2 wheels) Gait Pattern/deviations: Step-through pattern, Trunk flexed, Narrow base of support Gait velocity: decreased     General Gait Details: Pt ambulated just outside his room, stopped for a minute and then back to his bed. CGA and RW to steady, no apparent LOB.   Stairs             Wheelchair Mobility    Modified Rankin (Stroke Patients Only)       Balance Overall balance assessment: Needs assistance Sitting-balance support: No upper extremity supported, Feet supported Sitting balance-Leahy Scale: Fair Sitting balance - Comments: CGA progressing to close SUP as PT assisted with linen change/room clean up   Standing balance support: During functional activity, Bilateral upper extremity supported Standing balance-Leahy Scale: Fair Standing balance comment: RW used for support                            Cognition Arousal/Alertness: Awake/alert Behavior During  Therapy: WFL for tasks assessed/performed Overall Cognitive Status: History of cognitive impairments - at baseline                                 General Comments: Willing to participate. Difficult to  understand at times.        Exercises Other Exercises Other Exercises: Additional 2 minutes of standing as CNA assisted with linen change. PT providing CGA at all times in standing.    General Comments        Pertinent Vitals/Pain Pain Assessment Facial Expression: smiling or inexpressive    Home Living                          Prior Function            PT Goals (current goals can now be found in the care plan section) Acute Rehab PT Goals Patient Stated Goal: to return to ALF (per care team and daughter request )    Frequency    Min 2X/week      PT Plan      Co-evaluation              AM-PAC PT "6 Clicks" Mobility   Outcome Measure  Help needed turning from your back to your side while in a flat bed without using bedrails?: A Little Help needed moving from lying on your back to sitting on the side of a flat bed without using bedrails?: A Little Help needed moving to and from a bed to a chair (including a wheelchair)?: A Little Help needed standing up from a chair using your arms (e.g., wheelchair or bedside chair)?: A Little Help needed to walk in hospital room?: A Little Help needed climbing 3-5 steps with a railing? : A Lot 6 Click Score: 17    End of Session   Activity Tolerance: Patient tolerated treatment well;Other (comment) (limited by cognition) Patient left: in bed;with call bell/phone within reach;with bed alarm set Nurse Communication: Mobility status PT Visit Diagnosis: Muscle weakness (generalized) (M62.81);Difficulty in walking, not elsewhere classified (R26.2)     Time: 1829-9371 PT Time Calculation (min) (ACUTE ONLY): 21 min  Charges:  $Therapeutic Activity: 8-22 mins                     Patrina Levering PT, DPT

## 2021-09-14 DIAGNOSIS — L8990 Pressure ulcer of unspecified site, unspecified stage: Secondary | ICD-10-CM | POA: Insufficient documentation

## 2021-09-14 DIAGNOSIS — Z79899 Other long term (current) drug therapy: Secondary | ICD-10-CM | POA: Diagnosis not present

## 2021-09-14 DIAGNOSIS — Z85118 Personal history of other malignant neoplasm of bronchus and lung: Secondary | ICD-10-CM | POA: Diagnosis not present

## 2021-09-14 NOTE — Progress Notes (Signed)
Physical Therapy Treatment Patient Details Name: Timothy Noble MRN: [id number] DOB: December 14, 1930 Today's Date: 09/14/2021   History of Present Illness presented to ER from ALF due to increased agitation/combativeness, refusing to take medication    PT Comments    Pt supine in bed, agreeable to therapy. Similar to yesterday, required bilateral HHA to sit up to EOB. He did demonstrate increased difficulty with maintaining balance while sitting EOB with occasional posterior LOB. Ambulation distance increased significantly this date, up to 177f. He did become fatigued towards end of ambulation and decided he wanted to lie down on a vacant bed in the hallway. PT unable to redirect pt; he sat down on bed and required encouragement by both PT and RN to stand up and complete 284fwalk back to room. Pt returned to supine in bed, all needs met and alarm activated. D/c recs remain appropriate. Would benefit from skilled PT to address above deficits and promote optimal return to PLOF.   Recommendations for follow up therapy are one component of a multi-disciplinary discharge planning process, led by the attending physician.  Recommendations may be updated based on patient status, additional functional criteria and insurance authorization.  Follow Up Recommendations  Home health PT (Author recommends pt return to ALF/[facility] unit ([facility] at [place]. His cognition deficits limits him more so than physical limitations. Would benefit from HHPT at memory unit to focus on improving balance and independence with ADLs.)     Assistance Recommended at Discharge Frequent or constant Supervision/Assistance  Patient can return home with the following A little help with walking and/or transfers;A little help with bathing/dressing/bathroom;Assistance with cooking/housework;Assistance with feeding;Direct supervision/assist for medications management;Assist for transportation;Help with stairs or ramp for entrance;Direct  supervision/assist for financial management   Equipment Recommendations  Other (comment);Rolling walker (2 wheels) (If pt does not have one already)    Recommendations for Other Services       Precautions / Restrictions Precautions Precautions: Fall Restrictions Weight Bearing Restrictions: No     Mobility  Bed Mobility Overal bed mobility: Needs Assistance Bed Mobility: Supine to Sit, Sit to Supine     Supine to sit: Min assist Sit to supine: Min guard   General bed mobility comments: MIN A for bilateral HHA pt to pull himself to upright; pt returned to bed w/o physical assist. PT did assist with repositioning towards HOB.    Transfers Overall transfer level: Needs assistance Equipment used: Rolling walker (2 wheels) Transfers: Sit to/from Stand Sit to Stand: Min guard           General transfer comment: From EOB; CGA to steady posterior lean on initial stand    Ambulation/Gait Ambulation/Gait assistance: Min guard Gait Distance (Feet): 100 Feet Assistive device: Rolling walker (2 wheels) Gait Pattern/deviations: Step-through pattern, Trunk flexed, Narrow base of support Gait velocity: decreased     General Gait Details: [id number] [id number] stride length, forward head with difficulty looking forward due to kyphotic posture in standing. He became distracted by a bed in the hallway and stated he wanted to lay down; pt did sit on EOB in hallway before PT could convince him to stand and complete his walk to his room.   Stairs             Wheelchair Mobility    Modified Rankin (Stroke Patients Only)       Balance Overall balance assessment: Needs assistance Sitting-balance support: No upper extremity supported, Feet supported Sitting balance-Leahy Scale: Poor Sitting balance - Comments: Occasional posterior  LOB; PT cueing pt to lean forward Postural control: Posterior lean Standing balance support: During functional activity, Bilateral upper  extremity supported Standing balance-Leahy Scale: Fair Standing balance comment: RW used for support                            Cognition Arousal/Alertness: Awake/alert Behavior During Therapy: WFL for tasks assessed/performed Overall Cognitive Status: History of cognitive impairments - at baseline                                 General Comments: Willing to participate. Difficult to understand at times.        Exercises      General Comments        Pertinent Vitals/Pain Pain Assessment Pain Assessment: No/denies pain    Home Living                          Prior Function            PT Goals (current goals can now be found in the care plan section) Acute Rehab PT Goals Patient Stated Goal: to return to ALF (per care team and daughter request )    Frequency    Min 2X/week      PT Plan      Co-evaluation              AM-PAC PT "6 Clicks" Mobility   Outcome Measure  Help needed turning from your back to your side while in a flat bed without using bedrails?: A Little Help needed moving from lying on your back to sitting on the side of a flat bed without using bedrails?: A Little Help needed moving to and from a bed to a chair (including a wheelchair)?: A Little Help needed standing up from a chair using your arms (e.g., wheelchair or bedside chair)?: A Little Help needed to walk in hospital room?: A Little Help needed climbing 3-5 steps with a railing? : A Lot 6 Click Score: 17    End of Session Equipment Utilized During Treatment: Gait belt Activity Tolerance: Patient tolerated treatment well;Other (comment) (limited by cognition) Patient left: in bed;with call bell/phone within reach;with bed alarm set Nurse Communication: Mobility status PT Visit Diagnosis: Muscle weakness (generalized) (M62.81);Difficulty in walking, not elsewhere classified (R26.2)     Time: 7741-2878 PT Time Calculation (min) (ACUTE  ONLY): 23 min  Charges:  $Therapeutic Activity: 23-37 mins                     Patrina Levering PT, DPT

## 2021-09-14 NOTE — ED Notes (Signed)
Pt boosted up in bed at this time.  No brief noted on pt, but pt was clean and dry around peri area.  New brief placed on pt at this time and full set of VS obtained.  Pt denies further needs.

## 2021-09-14 NOTE — ED Notes (Signed)
Duoderm placed on the pt's bottom. Redness was present on the center of his bottom but no skin breakdown present. Pt brief placed back on the pt.

## 2021-09-14 NOTE — Discharge Instructions (Addendum)
Please use the Haldol or Ativan as needed.  Follow-up with your doctors, please return as needed  Patient has had a foam pad placed on his sacrum.  He does have some redness there.  Currently it blanches easily.

## 2021-09-14 NOTE — ED Notes (Signed)
Pt alert, RN assisted pt to vehicle, pt left with caregiver from SNF.

## 2021-09-14 NOTE — ED Notes (Signed)
Hospital meal provided, pt tolerated w/o complaints.  Waste discarded appropriately.  

## 2021-09-14 NOTE — ED Provider Notes (Signed)
-----------------------------------------   9:06 AM on 09/14/2021 ----------------------------------------- Patient for discharge.  Patient has a red area on his sacrum but it blanches very easily.  Just to be safe we will put a decubitus pad foam pad on it.  Patient certainly is at risk for developing a decubitus in this area.  I am not sure that he actually has even a stage I at this point.   Nena Polio, MD 09/14/21 [phone]

## 2021-09-14 NOTE — ED Notes (Signed)
Pending return to facility

## 2021-09-14 NOTE — ED Notes (Signed)
[place] [employer] dropped off some clothes for the pt.

## 2021-09-14 NOTE — ED Notes (Signed)
Patient linens were wet due to urine incontinence. Patients was cleaned and linens were changed. Patient is resting in bed, clean and dry.

## 2021-09-14 NOTE — TOC Transition Note (Signed)
Transition of Care [facility]) - CM/SW Discharge Note   Patient Details  Name: Timothy Noble MRN: [id number] Date of Birth: Nov 26, 1930  Transition of Care [facility]) CM/SW Contact:  Shelbie Hutching, RN Phone Number: 09/14/2021, 8:50 AM   Clinical Narrative:    Patient will discharge back to [facility] today.  [facility] will start Staffing the patient today at 75.  PT and OT also ordered on the [facility] so patient can get therapy at the ALF.  Patient's son Zenia Resides will pick patient up today and transport to [facility].     Final next level of care: Assisted Living Barriers to Discharge: Barriers Resolved   Patient Goals and CMS Choice Patient states their goals for this hospitalization and ongoing recovery are:: Patient will return to [facility] CMS Medicare.gov Compare Post Acute Care list provided to:: Legal Guardian Choice offered to / list presented to : [place] / [place]  Discharge Placement              Patient chooses bed at: Other - please specify in the comment section below: ([facility]) Patient to be transferred to facility by: Family Name of family member notified: Toula Moos Patient and family notified of of transfer: 09/14/21  Discharge Plan and Services   Discharge Planning Services: CM Consult Post Acute Care Choice: [place]          DME Arranged: N/A DME Agency: NA       HH Arranged: NA HH Agency: NA        Social Determinants of Health (SDOH) Interventions     Readmission Risk Interventions     No data to display

## 2021-09-14 NOTE — NC FL2 (Signed)
[place] LEVEL OF CARE SCREENING TOOL     IDENTIFICATION  Patient Name: Timothy Noble Birthdate: 1931/02/28 Sex: male Admission Date (Current Location): 09/03/2021  [place] and [place] Number:  Engineering geologist and Address:  [facility], [address] [place], [place] [address]      Provider Number: [id number]  Attending Physician Name and Address:  No att. providers found  Relative Name and Phone Number:  Toula Moos DSS Guardian [phone]    Current Level of Care: Other (Comment) ([place] in ED) Recommended Level of Care: [place] Prior Approval Number:    Date Approved/Denied:   PASRR Number: NA  Discharge Plan: Domiciliary (Rest home) (ALF)    Current Diagnoses: Patient Active Problem List   Diagnosis Date Noted   Pressure injury of skin 09/14/2021   Osteoporosis 03/03/2021   Dementia with behavioral disturbance ([place]) 03/03/2021   BPH (benign prostatic hyperplasia) 02/17/2020   HOH (hard of hearing) 02/17/2020   Oropharyngeal dysphagia 02/05/2016    Orientation RESPIRATION BLADDER Height & Weight     Self  Normal Incontinent Weight: 60.9 kg Height:  5\' 9"  (175.3 cm)  BEHAVIORAL SYMPTOMS/MOOD NEUROLOGICAL BOWEL NUTRITION STATUS  Other (Comment) (intermittant agitation- redirectable)   Incontinent Diet (Dysphagia diet 2)  AMBULATORY STATUS COMMUNICATION OF NEEDS Skin   Limited Assist Verbally Normal                       Personal Care Assistance Level of Assistance  Bathing, Feeding, Dressing Bathing Assistance: Limited assistance Feeding assistance: Limited assistance Dressing Assistance: Limited assistance     Functional Limitations Info  Sight, Hearing, Speech Sight Info: Adequate Hearing Info: Adequate Speech Info: Adequate    SPECIAL CARE FACTORS FREQUENCY  PT (By licensed PT), OT (By licensed OT)     PT Frequency: Home Health or outpatient PT eval and treat 2- 3 times per  week OT Frequency: Home health or Outpatient OT eval and treat 2-3 times per week            Contractures Contractures Info: Not present    Additional Factors Info  Code Status, Allergies Code Status Info: Full Allergies Info: Alendronate, terzzosin, oxycodone           Current Medications (09/14/2021):  This is the current hospital active medication list Current Facility-Administered Medications  Medication Dose Route Frequency Provider Last Rate Last Admin   busPIRone (BUSPAR) tablet 10 mg  10 mg Oral BID Vanessa Gaines, MD   10 mg at 09/13/21 2210   cholecalciferol (VITAMIN D3) tablet 1,000 Units  1,000 Units Oral Daily Vanessa [place], MD   1,000 Units at 09/13/21 0944   diphenhydrAMINE (BENADRYL) 12.5 MG/5ML elixir 25 mg  25 mg Oral QHS PRN Patrecia Pour, NP       Or   diphenhydrAMINE (BENADRYL) injection 25 mg  25 mg Intravenous QHS PRN Patrecia Pour, NP       divalproex (DEPAKOTE) DR tablet 250 mg  250 mg Oral Q12H Patrecia Pour, NP   250 mg at 09/13/21 2210   DULoxetine (CYMBALTA) DR capsule 30 mg  30 mg Oral Daily Vanessa Boone, MD   30 mg at 09/13/21 0944   finasteride (PROSCAR) tablet 5 mg  5 mg Oral Daily Vanessa [place], MD   5 mg at 09/13/21 0944   gabapentin (NEURONTIN) capsule 400 mg  400 mg Oral TID Vanessa , MD  400 mg at 09/13/21 2209   hydrocortisone cream 1 %   Topical BID Arta Silence, MD   Given at 09/13/21 2216   melatonin tablet 5 mg  5 mg Oral QHS Vanessa Hubbardston, MD   5 mg at 09/13/21 2210   OLANZapine zydis (ZYPREXA) disintegrating tablet 5 mg  5 mg Oral BID PRN Patrecia Pour, NP       Or   OLANZapine (ZYPREXA) injection 5 mg  5 mg Intramuscular BID PRN Patrecia Pour, NP   5 mg at 09/12/21 0231   pantoprazole (PROTONIX) EC tablet 40 mg  40 mg Oral Daily Vanessa Redway, MD   40 mg at 09/13/21 0944   polyethylene glycol (MIRALAX / GLYCOLAX) packet 17 g  17 g Oral Daily Vanessa Hopewell, MD   17 g at 09/13/21 4034   senna-docusate (Senokot-S)  tablet 1 tablet  1 tablet Oral Daily PRN Vanessa Kanab, MD       sertraline (ZOLOFT) 20 MG/ML concentrated solution 50 mg  50 mg Oral Daily Patrecia Pour, NP   50 mg at 09/13/21 0944   tamsulosin (FLOMAX) capsule 0.4 mg  0.4 mg Oral QHS Vanessa [place]ar[place], MD   0.4 mg at 09/13/21 2210   tiZANidine (ZANAFLEX) tablet 4 mg  4 mg Oral Q8H PRN Vanessa Heathrow, MD       umeclidinium bromide (INCRUSE ELLIPTA) 62.5 MCG/ACT 1 puff  1 puff Inhalation Daily Lorna Dibble, RPH   1 puff at 09/10/21 1004   Current Outpatient Medications  Medication Sig Dispense Refill   albuterol (PROVENTIL) (2.5 MG/3ML) 0.083% nebulizer solution Take 2.5 mg by nebulization every 6 (six) hours as needed for wheezing or shortness of breath.     busPIRone (BUSPAR) 10 MG tablet Take 10 mg by mouth 2 (two) times daily.     busPIRone (BUSPAR) 10 MG tablet Take 1 tablet (10 mg total) by mouth 2 (two) times daily. 60 tablet 1   diclofenac Sodium (VOLTAREN) 1 % GEL APPLY 2 GRAMS TOPICALLY FOUR TIMES A DAY AS DIRECTED     divalproex (DEPAKOTE) 250 MG DR tablet Take 1 tablet (250 mg total) by mouth 2 (two) times daily. 60 tablet 2   DULoxetine (CYMBALTA) 30 MG capsule Take 30 mg by mouth daily.     DULoxetine (CYMBALTA) 30 MG capsule Take 1 capsule (30 mg total) by mouth daily. 30 capsule 2   finasteride (PROSCAR) 5 MG tablet Take 5 mg by mouth daily.     finasteride (PROSCAR) 5 MG tablet Take 1 tablet (5 mg total) by mouth daily. 30 tablet 2   gabapentin (NEURONTIN) 400 MG capsule Take 400 mg by mouth 3 (three) times daily.     gabapentin (NEURONTIN) 400 MG capsule Take 1 capsule (400 mg total) by mouth 3 (three) times daily. 90 capsule 2   haloperidol (HALDOL) 2 MG tablet Take 1 tablet (2 mg total) by mouth 2 (two) times daily. 60 tablet 1   haloperidol (HALDOL) 2 MG/ML solution Take 2 mg by mouth 2 (two) times daily.     melatonin 3 MG TABS tablet Take 3 mg by mouth at bedtime.     melatonin 5 MG TABS Take 1 tablet (5 mg total) by  mouth at bedtime. 30 tablet 1   naproxen (NAPROSYN) 500 MG tablet Take 500 mg by mouth daily as needed for mild pain.     OLANZapine (ZYPREXA) 5 MG tablet Take 1 tablet (5  mg total) by mouth 2 (two) times daily as needed (agitation). 30 tablet 2   omeprazole (PRILOSEC) 20 MG capsule Take 20 mg by mouth daily before breakfast.     pantoprazole (PROTONIX) 40 MG tablet Take 1 tablet (40 mg total) by mouth daily. 30 tablet 1   polyethylene glycol (MIRALAX / GLYCOLAX) 17 g packet Take 17 g by mouth daily.     senna-docusate (SENOKOT-S) 8.6-50 MG tablet Take 1 tablet by mouth daily as needed for mild constipation.     sertraline (ZOLOFT) 50 MG tablet Take 1 tablet (50 mg total) by mouth daily. 30 tablet 2   tamsulosin (FLOMAX) 0.4 MG CAPS capsule Take 0.4 mg by mouth at bedtime.     tamsulosin (FLOMAX) 0.4 MG CAPS capsule Take 1 capsule (0.4 mg total) by mouth daily. 30 capsule 1   Tiotropium Bromide Monohydrate (SPIRIVA RESPIMAT) 1.25 MCG/ACT AERS Inhale 2 each into the lungs in the morning.     umeclidinium bromide (INCRUSE ELLIPTA) 62.5 MCG/ACT AEPB Inhale 1 puff into the lungs daily. 7 each 2   VITAMIN D, CHOLECALCIFEROL, PO Take 1,000 Units by mouth daily.     acetaminophen (TYLENOL) 325 MG tablet TAKE THREE TABLETS BY MOUTH EVERY 8 HOURS AS NEEDED FOR PAIN OR FEVER EACH TABLET CONTAINS 325MG  ACETAMINOPHEN (TYLENOL,APAP). MAXIMUM DAILY RECOMMENDED DOSE IS 3000MG      ascorbic acid (VITAMIN C) 500 MG tablet Take 1 tablet by mouth daily. (Patient not taking: Reported on 09/04/2021)     budesonide-formoterol (SYMBICORT) 160-4.5 MCG/ACT inhaler Inhale into the lungs. (Patient not taking: Reported on 03/30/2021)     calcium-vitamin D (OSCAL WITH D) 500-200 MG-UNIT TABS tablet Take 1 tablet by mouth daily. (Patient not taking: Reported on 09/04/2021)     cetirizine (ZYRTEC) 10 MG tablet Take 10 mg by mouth daily. (Patient not taking: Reported on 09/04/2021)     clotrimazole-betamethasone (LOTRISONE) cream Apply  1-2 times a day. 30 g 1   fluticasone (FLONASE) 50 MCG/ACT nasal spray 2 sprays by Each Nare route two (2) times a day as needed for rhinitis.     guaifenesin (HUMIBID E) 400 MG TABS tablet Take 1 tablet by mouth every 6 (six) hours as needed.     lidocaine (LIDODERM) 5 % APPLY 1 PATCH TO SKIN EVERY DAY APPLY ONCE DAILY FOR UP TO 12 HOURS (12 HOURS ON THEN REMOVE FOR 12 HOURS)     sertraline (ZOLOFT) 20 MG/ML concentrated solution Take 50-100 mLs by mouth daily. Take 2.5 mL (50 mg) by mouth daily for 7 days starting 09/02/21, then increase to 5 mL (100 mg) daily, thereafter     tiZANidine (ZANAFLEX) 4 MG tablet Take 1 tablet (4 mg total) by mouth every 8 (eight) hours as needed for muscle spasms. 30 tablet 1     Discharge Medications: Your medications have changed  Icon medications to start taking   START taking: divalproex (Depakote)  haloperidol (HALDOL)  Incruse Ellipta (umeclidinium bromide)  OLANZapine (ZyPREXA)  pantoprazole (Protonix)  Icon medications to change how you take   CHANGE how you take: busPIRone (BUSPAR)  DULoxetine (CYMBALTA)  finasteride (PROSCAR)  gabapentin (NEURONTIN)  melatonin  sertraline (ZOLOFT)  tamsulosin [facility])  Review your updated medication list below. Icon medication pick up information              Pick up these medications at [place], [place] [address]  busPIRone  divalproex  DULoxetine  finasteride  gabapentin  haloperidol  Incruse Ellipta  melatonin  OLANZapine  pantoprazole  sertraline  tamsulosin Please see AVS for discharge medications.    Relevant Imaging Results:  Relevant Lab Results:   Additional Information SS# [id number]  Shelbie Hutching, RN

## 2021-09-16 DIAGNOSIS — G894 Chronic pain syndrome: Secondary | ICD-10-CM | POA: Diagnosis not present

## 2021-09-16 DIAGNOSIS — M533 Sacrococcygeal disorders, not elsewhere classified: Secondary | ICD-10-CM | POA: Diagnosis not present

## 2021-09-16 DIAGNOSIS — R269 Unspecified abnormalities of gait and mobility: Secondary | ICD-10-CM | POA: Diagnosis not present

## 2021-09-16 DIAGNOSIS — M545 Low back pain, unspecified: Secondary | ICD-10-CM | POA: Diagnosis not present

## 2021-09-16 DIAGNOSIS — K5901 Slow transit constipation: Secondary | ICD-10-CM | POA: Diagnosis not present

## 2021-09-16 DIAGNOSIS — R451 Restlessness and agitation: Secondary | ICD-10-CM | POA: Diagnosis not present

## 2021-09-16 DIAGNOSIS — G301 Alzheimer's disease with late onset: Secondary | ICD-10-CM | POA: Diagnosis not present

## 2021-09-16 DIAGNOSIS — F02B3 Dementia in other diseases classified elsewhere, moderate, with mood disturbance: Secondary | ICD-10-CM | POA: Diagnosis not present

## 2021-09-30 DIAGNOSIS — G301 Alzheimer's disease with late onset: Secondary | ICD-10-CM | POA: Diagnosis not present

## 2021-09-30 DIAGNOSIS — R451 Restlessness and agitation: Secondary | ICD-10-CM | POA: Diagnosis not present

## 2021-09-30 DIAGNOSIS — F02B3 Dementia in other diseases classified elsewhere, moderate, with mood disturbance: Secondary | ICD-10-CM | POA: Diagnosis not present

## 2021-10-07 DIAGNOSIS — F02B3 Dementia in other diseases classified elsewhere, moderate, with mood disturbance: Secondary | ICD-10-CM | POA: Diagnosis not present

## 2021-10-07 DIAGNOSIS — F0284 Dementia in other diseases classified elsewhere, unspecified severity, with anxiety: Secondary | ICD-10-CM | POA: Diagnosis not present

## 2021-10-07 DIAGNOSIS — I10 Essential (primary) hypertension: Secondary | ICD-10-CM | POA: Diagnosis not present

## 2021-10-07 DIAGNOSIS — G309 Alzheimer's disease, unspecified: Secondary | ICD-10-CM | POA: Diagnosis not present

## 2021-10-07 DIAGNOSIS — M5440 Lumbago with sciatica, unspecified side: Secondary | ICD-10-CM | POA: Diagnosis not present

## 2021-10-07 DIAGNOSIS — Z85118 Personal history of other malignant neoplasm of bronchus and lung: Secondary | ICD-10-CM | POA: Diagnosis not present

## 2021-10-07 DIAGNOSIS — R634 Abnormal weight loss: Secondary | ICD-10-CM | POA: Diagnosis not present

## 2021-10-07 DIAGNOSIS — M810 Age-related osteoporosis without current pathological fracture: Secondary | ICD-10-CM | POA: Diagnosis not present

## 2021-10-07 DIAGNOSIS — G894 Chronic pain syndrome: Secondary | ICD-10-CM | POA: Diagnosis not present

## 2021-10-07 DIAGNOSIS — K5901 Slow transit constipation: Secondary | ICD-10-CM | POA: Diagnosis not present

## 2021-10-07 DIAGNOSIS — F0283 Dementia in other diseases classified elsewhere, unspecified severity, with mood disturbance: Secondary | ICD-10-CM | POA: Diagnosis not present

## 2021-10-07 DIAGNOSIS — R451 Restlessness and agitation: Secondary | ICD-10-CM | POA: Diagnosis not present

## 2021-10-07 DIAGNOSIS — Z791 Long term (current) use of non-steroidal anti-inflammatories (NSAID): Secondary | ICD-10-CM | POA: Diagnosis not present

## 2021-10-07 DIAGNOSIS — M533 Sacrococcygeal disorders, not elsewhere classified: Secondary | ICD-10-CM | POA: Diagnosis not present

## 2021-10-07 DIAGNOSIS — K5900 Constipation, unspecified: Secondary | ICD-10-CM | POA: Diagnosis not present

## 2021-10-07 DIAGNOSIS — E441 Mild protein-calorie malnutrition: Secondary | ICD-10-CM | POA: Diagnosis not present

## 2021-10-07 DIAGNOSIS — K219 Gastro-esophageal reflux disease without esophagitis: Secondary | ICD-10-CM | POA: Diagnosis not present

## 2021-10-07 DIAGNOSIS — R269 Unspecified abnormalities of gait and mobility: Secondary | ICD-10-CM | POA: Diagnosis not present

## 2021-10-07 DIAGNOSIS — G301 Alzheimer's disease with late onset: Secondary | ICD-10-CM | POA: Diagnosis not present

## 2021-10-07 DIAGNOSIS — G8929 Other chronic pain: Secondary | ICD-10-CM | POA: Diagnosis not present

## 2021-10-07 DIAGNOSIS — F32A Depression, unspecified: Secondary | ICD-10-CM | POA: Diagnosis not present

## 2021-10-07 DIAGNOSIS — M519 Unspecified thoracic, thoracolumbar and lumbosacral intervertebral disc disorder: Secondary | ICD-10-CM | POA: Diagnosis not present

## 2021-10-07 DIAGNOSIS — Z681 Body mass index (BMI) 19 or less, adult: Secondary | ICD-10-CM | POA: Diagnosis not present

## 2021-10-07 DIAGNOSIS — H9193 Unspecified hearing loss, bilateral: Secondary | ICD-10-CM | POA: Diagnosis not present

## 2021-10-12 DIAGNOSIS — G309 Alzheimer's disease, unspecified: Secondary | ICD-10-CM | POA: Diagnosis not present

## 2021-10-12 DIAGNOSIS — G301 Alzheimer's disease with late onset: Secondary | ICD-10-CM | POA: Diagnosis not present

## 2021-10-12 DIAGNOSIS — M5440 Lumbago with sciatica, unspecified side: Secondary | ICD-10-CM | POA: Diagnosis not present

## 2021-10-12 DIAGNOSIS — G8929 Other chronic pain: Secondary | ICD-10-CM | POA: Diagnosis not present

## 2021-10-12 DIAGNOSIS — I10 Essential (primary) hypertension: Secondary | ICD-10-CM | POA: Diagnosis not present

## 2021-10-12 DIAGNOSIS — M810 Age-related osteoporosis without current pathological fracture: Secondary | ICD-10-CM | POA: Diagnosis not present

## 2021-10-12 DIAGNOSIS — Z791 Long term (current) use of non-steroidal anti-inflammatories (NSAID): Secondary | ICD-10-CM | POA: Diagnosis not present

## 2021-10-12 DIAGNOSIS — F0284 Dementia in other diseases classified elsewhere, unspecified severity, with anxiety: Secondary | ICD-10-CM | POA: Diagnosis not present

## 2021-10-12 DIAGNOSIS — K219 Gastro-esophageal reflux disease without esophagitis: Secondary | ICD-10-CM | POA: Diagnosis not present

## 2021-10-12 DIAGNOSIS — K5901 Slow transit constipation: Secondary | ICD-10-CM | POA: Diagnosis not present

## 2021-10-12 DIAGNOSIS — M519 Unspecified thoracic, thoracolumbar and lumbosacral intervertebral disc disorder: Secondary | ICD-10-CM | POA: Diagnosis not present

## 2021-10-12 DIAGNOSIS — F0283 Dementia in other diseases classified elsewhere, unspecified severity, with mood disturbance: Secondary | ICD-10-CM | POA: Diagnosis not present

## 2021-10-12 DIAGNOSIS — E441 Mild protein-calorie malnutrition: Secondary | ICD-10-CM | POA: Diagnosis not present

## 2021-10-12 DIAGNOSIS — Z85118 Personal history of other malignant neoplasm of bronchus and lung: Secondary | ICD-10-CM | POA: Diagnosis not present

## 2021-10-12 DIAGNOSIS — F02818 Dementia in other diseases classified elsewhere, unspecified severity, with other behavioral disturbance: Secondary | ICD-10-CM | POA: Diagnosis not present

## 2021-10-12 DIAGNOSIS — M545 Low back pain, unspecified: Secondary | ICD-10-CM | POA: Diagnosis not present

## 2021-10-12 DIAGNOSIS — F32A Depression, unspecified: Secondary | ICD-10-CM | POA: Diagnosis not present

## 2021-10-12 DIAGNOSIS — H9193 Unspecified hearing loss, bilateral: Secondary | ICD-10-CM | POA: Diagnosis not present

## 2021-10-18 DIAGNOSIS — I10 Essential (primary) hypertension: Secondary | ICD-10-CM | POA: Diagnosis not present

## 2021-10-18 DIAGNOSIS — F32A Depression, unspecified: Secondary | ICD-10-CM | POA: Diagnosis not present

## 2021-10-18 DIAGNOSIS — G8929 Other chronic pain: Secondary | ICD-10-CM | POA: Diagnosis not present

## 2021-10-18 DIAGNOSIS — Z85118 Personal history of other malignant neoplasm of bronchus and lung: Secondary | ICD-10-CM | POA: Diagnosis not present

## 2021-10-18 DIAGNOSIS — H9193 Unspecified hearing loss, bilateral: Secondary | ICD-10-CM | POA: Diagnosis not present

## 2021-10-18 DIAGNOSIS — M519 Unspecified thoracic, thoracolumbar and lumbosacral intervertebral disc disorder: Secondary | ICD-10-CM | POA: Diagnosis not present

## 2021-10-18 DIAGNOSIS — K219 Gastro-esophageal reflux disease without esophagitis: Secondary | ICD-10-CM | POA: Diagnosis not present

## 2021-10-18 DIAGNOSIS — Z791 Long term (current) use of non-steroidal anti-inflammatories (NSAID): Secondary | ICD-10-CM | POA: Diagnosis not present

## 2021-10-18 DIAGNOSIS — F0283 Dementia in other diseases classified elsewhere, unspecified severity, with mood disturbance: Secondary | ICD-10-CM | POA: Diagnosis not present

## 2021-10-18 DIAGNOSIS — M5440 Lumbago with sciatica, unspecified side: Secondary | ICD-10-CM | POA: Diagnosis not present

## 2021-10-18 DIAGNOSIS — F0284 Dementia in other diseases classified elsewhere, unspecified severity, with anxiety: Secondary | ICD-10-CM | POA: Diagnosis not present

## 2021-10-18 DIAGNOSIS — G309 Alzheimer's disease, unspecified: Secondary | ICD-10-CM | POA: Diagnosis not present

## 2021-10-18 DIAGNOSIS — M810 Age-related osteoporosis without current pathological fracture: Secondary | ICD-10-CM | POA: Diagnosis not present

## 2021-10-21 DIAGNOSIS — R269 Unspecified abnormalities of gait and mobility: Secondary | ICD-10-CM | POA: Diagnosis not present

## 2021-10-21 DIAGNOSIS — M533 Sacrococcygeal disorders, not elsewhere classified: Secondary | ICD-10-CM | POA: Diagnosis not present

## 2021-10-21 DIAGNOSIS — F02B3 Dementia in other diseases classified elsewhere, moderate, with mood disturbance: Secondary | ICD-10-CM | POA: Diagnosis not present

## 2021-10-21 DIAGNOSIS — K5901 Slow transit constipation: Secondary | ICD-10-CM | POA: Diagnosis not present

## 2021-10-21 DIAGNOSIS — G301 Alzheimer's disease with late onset: Secondary | ICD-10-CM | POA: Diagnosis not present

## 2021-10-21 DIAGNOSIS — G894 Chronic pain syndrome: Secondary | ICD-10-CM | POA: Diagnosis not present

## 2021-10-25 DIAGNOSIS — F32A Depression, unspecified: Secondary | ICD-10-CM | POA: Diagnosis not present

## 2021-10-25 DIAGNOSIS — Z85118 Personal history of other malignant neoplasm of bronchus and lung: Secondary | ICD-10-CM | POA: Diagnosis not present

## 2021-10-25 DIAGNOSIS — M519 Unspecified thoracic, thoracolumbar and lumbosacral intervertebral disc disorder: Secondary | ICD-10-CM | POA: Diagnosis not present

## 2021-10-25 DIAGNOSIS — G309 Alzheimer's disease, unspecified: Secondary | ICD-10-CM | POA: Diagnosis not present

## 2021-10-25 DIAGNOSIS — H9193 Unspecified hearing loss, bilateral: Secondary | ICD-10-CM | POA: Diagnosis not present

## 2021-10-25 DIAGNOSIS — M810 Age-related osteoporosis without current pathological fracture: Secondary | ICD-10-CM | POA: Diagnosis not present

## 2021-10-25 DIAGNOSIS — I10 Essential (primary) hypertension: Secondary | ICD-10-CM | POA: Diagnosis not present

## 2021-10-25 DIAGNOSIS — M5440 Lumbago with sciatica, unspecified side: Secondary | ICD-10-CM | POA: Diagnosis not present

## 2021-10-25 DIAGNOSIS — Z791 Long term (current) use of non-steroidal anti-inflammatories (NSAID): Secondary | ICD-10-CM | POA: Diagnosis not present

## 2021-10-25 DIAGNOSIS — G8929 Other chronic pain: Secondary | ICD-10-CM | POA: Diagnosis not present

## 2021-10-25 DIAGNOSIS — F0284 Dementia in other diseases classified elsewhere, unspecified severity, with anxiety: Secondary | ICD-10-CM | POA: Diagnosis not present

## 2021-10-25 DIAGNOSIS — F0283 Dementia in other diseases classified elsewhere, unspecified severity, with mood disturbance: Secondary | ICD-10-CM | POA: Diagnosis not present

## 2021-10-25 DIAGNOSIS — K219 Gastro-esophageal reflux disease without esophagitis: Secondary | ICD-10-CM | POA: Diagnosis not present

## 2021-10-28 DIAGNOSIS — F02B3 Dementia in other diseases classified elsewhere, moderate, with mood disturbance: Secondary | ICD-10-CM | POA: Diagnosis not present

## 2021-10-28 DIAGNOSIS — G301 Alzheimer's disease with late onset: Secondary | ICD-10-CM | POA: Diagnosis not present

## 2021-11-03 ENCOUNTER — Non-Acute Institutional Stay: Payer: Medicare Other | Admitting: Student

## 2021-11-03 DIAGNOSIS — R52 Pain, unspecified: Secondary | ICD-10-CM

## 2021-11-03 DIAGNOSIS — E46 Unspecified protein-calorie malnutrition: Secondary | ICD-10-CM

## 2021-11-03 DIAGNOSIS — F03918 Unspecified dementia, unspecified severity, with other behavioral disturbance: Secondary | ICD-10-CM

## 2021-11-03 DIAGNOSIS — Z515 Encounter for palliative care: Secondary | ICD-10-CM

## 2021-11-04 DIAGNOSIS — M25532 Pain in left wrist: Secondary | ICD-10-CM | POA: Diagnosis not present

## 2021-11-04 DIAGNOSIS — R600 Localized edema: Secondary | ICD-10-CM | POA: Diagnosis not present

## 2021-11-11 ENCOUNTER — Telehealth: Payer: Self-pay | Admitting: Student

## 2021-11-11 ENCOUNTER — Other Ambulatory Visit: Payer: Self-pay | Admitting: Home Modifications

## 2021-11-11 ENCOUNTER — Other Ambulatory Visit (HOSPITAL_COMMUNITY): Payer: Self-pay | Admitting: Home Modifications

## 2021-11-11 DIAGNOSIS — M810 Age-related osteoporosis without current pathological fracture: Secondary | ICD-10-CM

## 2021-11-11 NOTE — [place] Notes (Signed)
[facility] Consult Note Telephone: [phone]  Fax: [phone]   Date of encounter: 11/03/2021 3:00PM PATIENT NAME: Timothy Noble [id number]   [phone] (home)  DOB: 09-Apr-1930 MRN: [id number] PRIMARY CARE PROVIDER:    Olin Hauser, DO,  [place] [address] [phone]  REFERRING PROVIDER:   Olin Hauser, DO [address] [place],  [place] [address] [phone]  RESPONSIBLE PARTY:    Contact Information     Name Relation Home Work Mobile   Martell Legal [phone]         I met face to face with patient in the facility. Palliative Care was asked to follow this patient by consultation request of  Nobie Putnam * to address advance care planning and complex medical decision making. This is the initial visit.                                     ASSESSMENT AND PLAN / RECOMMENDATIONS:   Advance Care Planning/Goals of Care: Goals include to maximize quality of life and symptom management. Patient/health care surrogate gave his/her permission to discuss. CODE STATUS: TBD  Education provided on Palliative Medicine. He resides at [facility] on locked memory unit. Patient has legal guardian. Will follow up DSS case worker/guardian regarding [place], code status. Left message. Will monitor for functional and cognitive declines and refer to hospice when he meets eligibility requirements.   Symptom Management/Plan:  Dementia with behavioral disturbances- patient resides at the [facility] on locked dementia unit. Staff is to continue assisting with adl's. Patient has caregivers around the clock as well. Monitor for cognitive and functional declines. Monitor for falls/safety. Continue haldol, Buspar, sertraline, Depakote and olanzapine as directed.   Protein calorie malnutrition- patient with varied appetite per staff; his appetite has been better  recently. Last weight 131 pounds on 10/01/21. Staff is asked to obtain and updated weight. Will monitor for weight loss. Patient is able to feed himself; staff encouraged to assist as needed. Patient has been evaluated by ST.   Pain-patient with chronic back pain, sciatica; improved with Tramadol. Continue Tramadol, Voltaren gel, lidocaine patch as directed.   Follow up Palliative Care Visit: Palliative care will continue to follow for complex medical decision making, advance care planning, and clarification of goals. Return in 3-4 weeks or prn.  This visit was coded based on medical decision making (MDM).  PPS: 40%  HOSPICE ELIGIBILITY/DIAGNOSIS: TBD  Chief Complaint: Palliative Medicine initial consult.   HISTORY OF PRESENT ILLNESS:  Timothy Noble is a 88 y.o. year old male  with Alzheimer's dementia, chronic pain, back pain, gait instability, constipation, protein calorie malnutrition, emphysema, hypertension, hyperlipidemia.  Patient resides at [facility] on locked memory unit. Patient is dependent for  most adl's. Staff report a couple of falls in the past 4 weeks. He has caregivers around the clock. His appetite varies; staff report he has been eating better in the past couple of days. He has been evaluated by ST; staff is unsure if they are going to continue working with patient. He is able to feed self.   Patient received resting in recliner; he arouses to verbal and tactile stimulation. He is able to answer few direct questions. He does have a delayed response; speech is low in tone. He does not exhibit any signs of  discomfort. HPI and ROS primarily obtained from staff due to patient's dementia.   History obtained from review of EMR, discussion with primary team, and interview with family, facility staff/caregiver and/or Mr. Dibello.  I reviewed available labs, medications, imaging, studies and related documents from the EMR.  Records reviewed and summarized above.    ROS  Primarily obtained per staff d/t patient's dementia.  Physical Exam: Most recent weight: 131 pounds Constitutional: NAD General: frail appearing, chronically ill appearing EYES: anicteric sclera, lids intact, no discharge  ENMT: intact hearing, oral mucous membranes moist CV: S1S2, RRR, no LE edema Pulmonary: LCTA, no increased work of breathing, no cough, room air Abdomen: normo-active BS + 4 quadrants, soft and non tender, no ascites GU: deferred MSK: moves all extremities, stooped posture Skin: warm and dry, no rashes or wounds on visible skin Neuro:  + generalized weakness, A & O to person Psych: non-anxious affect Hem/lymph/immuno: no widespread bruising CURRENT PROBLEM LIST:  Patient Active Problem List   Diagnosis Date Noted   Pressure injury of skin 09/14/2021   Osteoporosis 03/03/2021   Dementia with behavioral disturbance ([place]) 03/03/2021   BPH (benign prostatic hyperplasia) 02/17/2020   HOH (hard of hearing) 02/17/2020   Oropharyngeal dysphagia 02/05/2016   PAST MEDICAL HISTORY:  Active Ambulatory Problems    Diagnosis Date Noted   BPH (benign prostatic hyperplasia) 02/17/2020   HOH (hard of hearing) 02/17/2020   Oropharyngeal dysphagia 02/05/2016   Osteoporosis 03/03/2021   Dementia with behavioral disturbance ([place]) 03/03/2021   Pressure injury of skin 09/14/2021   Resolved Ambulatory Problems    Diagnosis Date Noted   Acute bronchitis due to other specified organisms 01/31/2016   Allergy 02/17/2020   Anemia 02/17/2020   Back pain 02/17/2020   Centrilobular emphysema ([place]) 02/17/2020   Esophageal dysmotility 02/05/2016   Hydrocele 02/17/2020   Pain management contract agreement 11/06/2019   Abnormal findings on diagnostic imaging of lung 03/03/2021   Acute non-ST segment elevation myocardial infarction ([place]) 03/03/2021   Age-related osteoporosis without current pathological fracture 03/03/2021   Allergic rhinitis 03/03/2021   Benign essential  hypertension 03/03/2021   Bilateral hearing loss 03/03/2021   Cervical spondylosis without myelopathy 03/03/2021   Counseling, unspecified 03/03/2021   COVID-19 03/03/2021   Delirium due to known physiological condition 03/03/2021   Disorder of bone and cartilage 03/03/2021   Encounter for fitting and adjustment of hearing aid 64/33/2951   Folliculitis 88/41/6606   Insufficiency fracture 03/03/2021   Intervertebral disc disorder 03/03/2021   History of lung cancer 03/03/2021   Mild cognitive impairment of uncertain or unknown etiology 03/03/2021   Mixed hyperlipidemia 03/03/2021   Mood disorder of depressed type 03/03/2021   Muscle weakness (generalized) 03/03/2021   Myalgia and myositis 03/03/2021   Nasal polyp, unspecified 03/03/2021   Other abnormalities of gait and mobility 03/03/2021   Other examination of ears and hearing 03/03/2021   Problem related to unspecified psychosocial circumstances 03/03/2021   Restlessness and agitation 03/03/2021   Sensorineural hearing loss, bilateral 03/03/2021   Upper abdominal pain, unspecified 03/03/2021   S/P partial lobectomy of lung 03/11/2021   Chronic bilateral low back pain without sciatica 03/11/2021   Past Medical History:  Diagnosis Date   Cancer ([place])    Dementia ([place])    GERD (gastroesophageal reflux disease)    Lung cancer ([place])    SOCIAL HX:  Social History   Tobacco Use   Smoking status: Former    Types: Cigarettes    Quit date: 06/01/1979  Years since quitting: 42.4   Smokeless tobacco: Never  Substance Use Topics   Alcohol use: Never   FAMILY HX:  Family History  Family history unknown: Yes      ALLERGIES:  Allergies  Allergen Reactions   Alendronate Sodium    Terazosin     Other reaction(s): Dizziness   Oxycodone     Other reaction(s): Delirium     PERTINENT MEDICATIONS:  Outpatient Encounter Medications as of 11/03/2021  Medication Sig   acetaminophen (TYLENOL) 325 MG tablet TAKE THREE TABLETS BY  MOUTH EVERY 8 HOURS AS NEEDED FOR PAIN OR FEVER EACH TABLET CONTAINS 325MG ACETAMINOPHEN (TYLENOL,APAP). MAXIMUM DAILY RECOMMENDED DOSE IS 3000MG   albuterol (PROVENTIL) (2.5 MG/3ML) 0.083% nebulizer solution Take 2.5 mg by nebulization every 6 (six) hours as needed for wheezing or shortness of breath.   ascorbic acid (VITAMIN C) 500 MG tablet Take 1 tablet by mouth daily. (Patient not taking: Reported on 09/04/2021)   budesonide-formoterol (SYMBICORT) 160-4.5 MCG/ACT inhaler Inhale into the lungs. (Patient not taking: Reported on 03/30/2021)   busPIRone (BUSPAR) 10 MG tablet Take 10 mg by mouth 2 (two) times daily.   busPIRone (BUSPAR) 10 MG tablet Take 1 tablet (10 mg total) by mouth 2 (two) times daily.   calcium-vitamin D (OSCAL WITH D) 500-200 MG-UNIT TABS tablet Take 1 tablet by mouth daily. (Patient not taking: Reported on 09/04/2021)   cetirizine (ZYRTEC) 10 MG tablet Take 10 mg by mouth daily. (Patient not taking: Reported on 09/04/2021)   clotrimazole-betamethasone (LOTRISONE) cream Apply 1-2 times a day.   diclofenac Sodium (VOLTAREN) 1 % GEL APPLY 2 GRAMS TOPICALLY FOUR TIMES A DAY AS DIRECTED   divalproex (DEPAKOTE) 250 MG DR tablet Take 1 tablet (250 mg total) by mouth 2 (two) times daily.   DULoxetine (CYMBALTA) 30 MG capsule Take 30 mg by mouth daily.   DULoxetine (CYMBALTA) 30 MG capsule Take 1 capsule (30 mg total) by mouth daily.   finasteride (PROSCAR) 5 MG tablet Take 5 mg by mouth daily.   finasteride (PROSCAR) 5 MG tablet Take 1 tablet (5 mg total) by mouth daily.   fluticasone (FLONASE) 50 MCG/ACT nasal spray 2 sprays by Each Nare route two (2) times a day as needed for rhinitis.   gabapentin (NEURONTIN) 400 MG capsule Take 400 mg by mouth 3 (three) times daily.   gabapentin (NEURONTIN) 400 MG capsule Take 1 capsule (400 mg total) by mouth 3 (three) times daily.   guaifenesin (HUMIBID E) 400 MG TABS tablet Take 1 tablet by mouth every 6 (six) hours as needed.   haloperidol  (HALDOL) 2 MG tablet Take 1 tablet (2 mg total) by mouth 2 (two) times daily.   haloperidol (HALDOL) 2 MG/ML solution Take 2 mg by mouth 2 (two) times daily.   lidocaine (LIDODERM) 5 % APPLY 1 PATCH TO SKIN EVERY DAY APPLY ONCE DAILY FOR UP TO 12 HOURS (12 HOURS ON THEN REMOVE FOR 12 HOURS)   melatonin 3 MG TABS tablet Take 3 mg by mouth at bedtime.   melatonin 5 MG TABS Take 1 tablet (5 mg total) by mouth at bedtime.   naproxen (NAPROSYN) 500 MG tablet Take 500 mg by mouth daily as needed for mild pain.   OLANZapine (ZYPREXA) 5 MG tablet Take 1 tablet (5 mg total) by mouth 2 (two) times daily as needed (agitation).   omeprazole (PRILOSEC) 20 MG capsule Take 20 mg by mouth daily before breakfast.   pantoprazole (PROTONIX) 40 MG tablet Take 1  tablet (40 mg total) by mouth daily.   polyethylene glycol (MIRALAX / GLYCOLAX) 17 g packet Take 17 g by mouth daily.   senna-docusate (SENOKOT-S) 8.6-50 MG tablet Take 1 tablet by mouth daily as needed for mild constipation.   sertraline (ZOLOFT) 20 MG/ML concentrated solution Take 50-100 mLs by mouth daily. Take 2.5 mL (50 mg) by mouth daily for 7 days starting 09/02/21, then increase to 5 mL (100 mg) daily, thereafter   sertraline (ZOLOFT) 50 MG tablet Take 1 tablet (50 mg total) by mouth daily.   tamsulosin (FLOMAX) 0.4 MG CAPS capsule Take 0.4 mg by mouth at bedtime.   tamsulosin (FLOMAX) 0.4 MG CAPS capsule Take 1 capsule (0.4 mg total) by mouth daily.   Tiotropium Bromide Monohydrate (SPIRIVA RESPIMAT) 1.25 MCG/ACT AERS Inhale 2 each into the lungs in the morning.   tiZANidine (ZANAFLEX) 4 MG tablet Take 1 tablet (4 mg total) by mouth every 8 (eight) hours as needed for muscle spasms.   umeclidinium bromide (INCRUSE ELLIPTA) 62.5 MCG/ACT AEPB Inhale 1 puff into the lungs daily.   VITAMIN D, CHOLECALCIFEROL, PO Take 1,000 Units by mouth daily.   No facility-administered encounter medications on file as of 11/03/2021.   Thank you for the opportunity to  participate in the care of Mr. Laverdiere.  The palliative care team will continue to follow. Please call our office at [phone] if we can be of additional assistance.   Ezekiel Slocumb, NP ,   COVID-19 PATIENT SCREENING TOOL Asked and negative response unless otherwise noted:  Have you had symptoms of covid, tested positive or been in contact with someone with symptoms/positive test in the past 5-10 days?

## 2021-11-11 NOTE — Telephone Encounter (Signed)
Palliative NP left message for legal guardian Toula Moos. Awaiting return call.

## 2021-11-18 DIAGNOSIS — F02B3 Dementia in other diseases classified elsewhere, moderate, with mood disturbance: Secondary | ICD-10-CM | POA: Diagnosis not present

## 2021-11-18 DIAGNOSIS — G301 Alzheimer's disease with late onset: Secondary | ICD-10-CM | POA: Diagnosis not present

## 2021-11-18 DIAGNOSIS — G894 Chronic pain syndrome: Secondary | ICD-10-CM | POA: Diagnosis not present

## 2021-11-18 DIAGNOSIS — M533 Sacrococcygeal disorders, not elsewhere classified: Secondary | ICD-10-CM | POA: Diagnosis not present

## 2021-11-18 DIAGNOSIS — R269 Unspecified abnormalities of gait and mobility: Secondary | ICD-10-CM | POA: Diagnosis not present

## 2021-12-15 ENCOUNTER — Non-Acute Institutional Stay: Payer: Medicare Other | Admitting: Student

## 2021-12-15 DIAGNOSIS — E46 Unspecified protein-calorie malnutrition: Secondary | ICD-10-CM

## 2021-12-15 DIAGNOSIS — L98411 Non-pressure chronic ulcer of buttock limited to breakdown of skin: Secondary | ICD-10-CM

## 2021-12-15 DIAGNOSIS — F03918 Unspecified dementia, unspecified severity, with other behavioral disturbance: Secondary | ICD-10-CM

## 2021-12-15 DIAGNOSIS — Z515 Encounter for palliative care: Secondary | ICD-10-CM

## 2021-12-15 NOTE — Progress Notes (Signed)
[facility] Palliative Care Consult Note Telephone: [phone]  Fax: [phone]    Date of encounter: 12/15/21 9:13 PM PATIENT NAME: Timothy [facility] [address] [place] [address]   [phone] (home)  DOB: March 20, 1931 MRN: [id number] PRIMARY CARE PROVIDER:    Olin Hauser, DO,  [place] [address] [phone]  REFERRING PROVIDER:   Thea Gist, NP-[facility]  RESPONSIBLE PARTY:    Contact Information     Name Relation Home Work Mobile   Timothy Noble [phone]  [phone]         I met face to face with patient and caregiver in the facility. Palliative Care was asked to follow this patient by consultation request of  Timothy Gist, NP-[facility] to address advance care planning and complex medical decision making. This is a follow up visit.                                   ASSESSMENT AND PLAN / RECOMMENDATIONS:   Advance Care Planning/Goals of Care: Goals include to maximize quality of life and symptom management. Patient/health care surrogate gave his/her permission to discuss. CODE STATUS: TBD  Education provided on Palliative Medicine. Patient has Noble guardian. Awaiting to speak with Noble guardian regard code status.  Symptom Management/Plan:  Dementia with behavioral disturbances- patient resides at the [facility] on locked dementia unit. Staff is to continue assisting with adl's. Mechanical lift is now being used for transfers. Patient has caregivers around the clock as well. Monitor for cognitive and functional declines. Monitor for falls/safety. FL2 has been completed due to patient requiring higher level of care.   Protein calorie malnutrition-patient continues with varied appetite. He is being fed mechanical soft diet. Staff to continue feeding patient. Recommend nutritional supplement BID.   Redness to buttocks-apply Boudreaux butt paste BID; script sent. Turn and  reposition every 2 hours.   Follow up Palliative Care Visit: Palliative care will continue to follow for complex medical decision making, advance care planning, and clarification of goals. Return in 4-6 weeks or prn.   This visit was coded based on medical decision making (MDM).  PPS: 30%  HOSPICE ELIGIBILITY/DIAGNOSIS: TBD  Chief Complaint: Palliative Medicine follow up visit.   HISTORY OF PRESENT ILLNESS:  Timothy Noble is a 88 y.o. year old male  with Timothy Noble's dementia, chronic pain, back pain, gait instability, constipation, protein calorie malnutrition, emphysema, hypertension, hyperlipidemia.   Patient resides at [facility] on locked memory unit. He is dependent for adl's. He is being fed by staff/caregivers. No worsening dysphagia. He is receiving a mechanical soft diet. Appetite has been fair.  He is being transferred via mechanical lift now. He is out of bed daily to w/c or recliner. Redness reported to buttocks; no open areas. No recent falls reported.  FL2 has been completed by PCP due to needing higher level of care.   History obtained from review of EMR, discussion with primary team, and interview with family, facility staff/caregiver and/or Timothy Noble.  I reviewed available labs, medications, imaging, studies and related documents from the EMR.  Records reviewed and summarized above.   ROS  Unable to substantially contribute d/t dementia.  Physical Exam: Weight: 128 pounds Pulse 88, resp 16, b/p 120/68, sats 93% on room air Constitutional: NAD General: frail appearing, chronically ill appearing EYES: anicteric sclera, lids intact, no discharge  ENMT: intact hearing,  oral mucous membranes moist CV: S1S2, RRR, no LE edema Pulmonary: LCTA, no increased work of breathing, no cough Abdomen: normo-active BS + 4 quadrants, soft and non tender GU: deferred MSK: +sarcopenia, moves all extremities, non-ambulatory Skin: warm and dry, no rashes or wounds on visible  skin Neuro: + generalized weakness,  A & O to person, delayed response Psych: non-anxious affect, calm, cooperative Hem/lymph/immuno: no widespread bruising   Thank you for the opportunity to participate in the care of Timothy Noble.  The palliative care team will continue to follow. Please call our office at [phone] if we can be of additional assistance.   Timothy Slocumb, NP   COVID-19 PATIENT SCREENING TOOL Asked and negative response unless otherwise noted:   Have you had symptoms of covid, tested positive or been in contact with someone with symptoms/positive test in the past 5-10 days? No

## 2021-12-21 ENCOUNTER — Other Ambulatory Visit: Payer: Self-pay

## 2021-12-21 ENCOUNTER — Inpatient Hospital Stay
Admission: EM | Admit: 2021-12-21 | Discharge: 2021-12-25 | DRG: 872 | Disposition: A | Discharge status: Other Acute Inpatient Hospital | Payer: No Typology Code available for payment source | Source: Skilled Nursing Facility | Attending: Internal Medicine | Admitting: Internal Medicine

## 2021-12-21 ENCOUNTER — Emergency Department: Payer: No Typology Code available for payment source

## 2021-12-21 DIAGNOSIS — F0390 Unspecified dementia without behavioral disturbance: Secondary | ICD-10-CM | POA: Diagnosis not present

## 2021-12-21 DIAGNOSIS — R112 Nausea with vomiting, unspecified: Secondary | ICD-10-CM | POA: Diagnosis present

## 2021-12-21 DIAGNOSIS — K6289 Other specified diseases of anus and rectum: Secondary | ICD-10-CM | POA: Diagnosis present

## 2021-12-21 DIAGNOSIS — F419 Anxiety disorder, unspecified: Secondary | ICD-10-CM | POA: Diagnosis present

## 2021-12-21 DIAGNOSIS — N179 Acute kidney failure, unspecified: Secondary | ICD-10-CM | POA: Diagnosis present

## 2021-12-21 DIAGNOSIS — K529 Noninfective gastroenteritis and colitis, unspecified: Secondary | ICD-10-CM | POA: Diagnosis present

## 2021-12-21 DIAGNOSIS — R6520 Severe sepsis without septic shock: Secondary | ICD-10-CM | POA: Diagnosis present

## 2021-12-21 DIAGNOSIS — Z885 Allergy status to narcotic agent status: Secondary | ICD-10-CM | POA: Diagnosis not present

## 2021-12-21 DIAGNOSIS — Z888 Allergy status to other drugs, medicaments and biological substances status: Secondary | ICD-10-CM

## 2021-12-21 DIAGNOSIS — Z20822 Contact with and (suspected) exposure to covid-19: Secondary | ICD-10-CM | POA: Diagnosis present

## 2021-12-21 DIAGNOSIS — L89152 Pressure ulcer of sacral region, stage 2: Secondary | ICD-10-CM | POA: Diagnosis present

## 2021-12-21 DIAGNOSIS — F0394 Unspecified dementia, unspecified severity, with anxiety: Secondary | ICD-10-CM | POA: Diagnosis present

## 2021-12-21 DIAGNOSIS — Z85118 Personal history of other malignant neoplasm of bronchus and lung: Secondary | ICD-10-CM | POA: Diagnosis not present

## 2021-12-21 DIAGNOSIS — R739 Hyperglycemia, unspecified: Secondary | ICD-10-CM | POA: Diagnosis present

## 2021-12-21 DIAGNOSIS — G629 Polyneuropathy, unspecified: Secondary | ICD-10-CM | POA: Diagnosis present

## 2021-12-21 DIAGNOSIS — E878 Other disorders of electrolyte and fluid balance, not elsewhere classified: Secondary | ICD-10-CM | POA: Diagnosis present

## 2021-12-21 DIAGNOSIS — F0393 Unspecified dementia, unspecified severity, with mood disturbance: Secondary | ICD-10-CM | POA: Diagnosis present

## 2021-12-21 DIAGNOSIS — K219 Gastro-esophageal reflux disease without esophagitis: Secondary | ICD-10-CM | POA: Diagnosis present

## 2021-12-21 DIAGNOSIS — Z87891 Personal history of nicotine dependence: Secondary | ICD-10-CM

## 2021-12-21 DIAGNOSIS — F32A Depression, unspecified: Secondary | ICD-10-CM | POA: Diagnosis present

## 2021-12-21 DIAGNOSIS — E860 Dehydration: Secondary | ICD-10-CM | POA: Diagnosis present

## 2021-12-21 DIAGNOSIS — E872 Acidosis, unspecified: Secondary | ICD-10-CM | POA: Diagnosis present

## 2021-12-21 DIAGNOSIS — E861 Hypovolemia: Secondary | ICD-10-CM | POA: Diagnosis present

## 2021-12-21 DIAGNOSIS — K869 Disease of pancreas, unspecified: Secondary | ICD-10-CM | POA: Diagnosis present

## 2021-12-21 DIAGNOSIS — A419 Sepsis, unspecified organism: Principal | ICD-10-CM | POA: Diagnosis present

## 2021-12-21 DIAGNOSIS — I459 Conduction disorder, unspecified: Secondary | ICD-10-CM | POA: Diagnosis present

## 2021-12-21 DIAGNOSIS — E871 Hypo-osmolality and hyponatremia: Secondary | ICD-10-CM | POA: Diagnosis present

## 2021-12-21 DIAGNOSIS — Z7189 Other specified counseling: Secondary | ICD-10-CM | POA: Diagnosis not present

## 2021-12-21 DIAGNOSIS — Z79899 Other long term (current) drug therapy: Secondary | ICD-10-CM

## 2021-12-21 LAB — CBC
HCT: 41.2 % (ref 39.0–52.0)
Hemoglobin: 13.1 g/dL (ref 13.0–17.0)
MCH: 30.6 pg (ref 26.0–34.0)
MCHC: 31.8 g/dL (ref 30.0–36.0)
MCV: 96.3 fL (ref 80.0–100.0)
Platelets: 247 10*3/uL (ref 150–400)
RBC: 4.28 MIL/uL (ref 4.22–5.81)
RDW: 14.2 % (ref 11.5–15.5)
WBC: 19.9 10*3/uL — ABNORMAL HIGH (ref 4.0–10.5)
nRBC: 0 % (ref 0.0–0.2)

## 2021-12-21 LAB — GASTROINTESTINAL PANEL BY PCR, STOOL (REPLACES STOOL CULTURE)

## 2021-12-21 LAB — C DIFFICILE QUICK SCREEN W PCR REFLEX
C Diff antigen: NEGATIVE
C Diff interpretation: NOT DETECTED
C Diff toxin: NEGATIVE

## 2021-12-21 LAB — COMPREHENSIVE METABOLIC PANEL
ALT: 13 U/L (ref 0–44)
AST: 27 U/L (ref 15–41)
Albumin: 3.8 g/dL (ref 3.5–5.0)
Alkaline Phosphatase: 58 U/L (ref 38–126)
Anion gap: 12 (ref 5–15)
BUN: 33 mg/dL — ABNORMAL HIGH (ref 8–23)
CO2: 26 mmol/L (ref 22–32)
Calcium: 9.6 mg/dL (ref 8.9–10.3)
Chloride: 95 mmol/L — ABNORMAL LOW (ref 98–111)
Creatinine, Ser: 1.52 mg/dL — ABNORMAL HIGH (ref 0.61–1.24)
GFR, Estimated: 44 mL/min — ABNORMAL LOW (ref >60)
Glucose, Bld: 144 mg/dL — ABNORMAL HIGH (ref 70–99)
Potassium: 5 mmol/L (ref 3.5–5.1)
Sodium: 133 mmol/L — ABNORMAL LOW (ref 135–145)
Total Bilirubin: 1 mg/dL (ref 0.3–1.2)
Total Protein: 7.2 g/dL (ref 6.5–8.1)

## 2021-12-21 LAB — URINALYSIS, ROUTINE W REFLEX MICROSCOPIC
Bilirubin Urine: NEGATIVE
Glucose, UA: NEGATIVE mg/dL
Hgb urine dipstick: NEGATIVE
Ketones, ur: NEGATIVE mg/dL
Leukocytes,Ua: NEGATIVE
Nitrite: NEGATIVE
Protein, ur: NEGATIVE mg/dL
Specific Gravity, Urine: 1.028 (ref 1.005–1.030)
pH: 6 (ref 5.0–8.0)

## 2021-12-21 LAB — LACTIC ACID, PLASMA: Lactic Acid, Venous: 2.4 mmol/L (ref 0.5–1.9)

## 2021-12-21 LAB — RESP PANEL BY RT-PCR (FLU A&B, COVID) ARPGX2
Influenza A by PCR: NEGATIVE
Influenza B by PCR: NEGATIVE
SARS Coronavirus 2 by RT PCR: NEGATIVE

## 2021-12-21 LAB — LIPASE, BLOOD: Lipase: 27 U/L (ref 11–51)

## 2021-12-21 LAB — TROPONIN I (HIGH SENSITIVITY): Troponin I (High Sensitivity): 16 ng/L (ref <18)

## 2021-12-21 MED ORDER — SERTRALINE HCL 50 MG PO TABS: 50.0000 mg | ORAL_TABLET | Freq: Every day | ORAL | Status: DC

## 2021-12-21 MED ORDER — DULOXETINE HCL 30 MG PO CPEP: 30.0000 mg | ORAL_CAPSULE | Freq: Every day | ORAL | Status: DC

## 2021-12-21 MED ORDER — ONDANSETRON HCL 4 MG/2ML IJ SOLN: 4.0000 mg | Freq: Four times a day (QID) | INTRAMUSCULAR | Status: DC | PRN

## 2021-12-21 MED ORDER — ACETAMINOPHEN 325 MG PO TABS
650.0000 mg | ORAL_TABLET | Freq: Four times a day (QID) | ORAL | Status: DC | PRN
  Administered 2021-12-24 – 2021-12-25 (×2): 650 mg via ORAL
  Filled 2021-12-21 (×2): qty 2

## 2021-12-21 MED ORDER — ENOXAPARIN SODIUM 30 MG/0.3ML IJ SOSY: 30.0000 mg | PREFILLED_SYRINGE | INTRAMUSCULAR | Status: DC

## 2021-12-21 MED ORDER — ACETAMINOPHEN 650 MG RE SUPP
650.0000 mg | Freq: Four times a day (QID) | RECTAL | Status: DC | PRN
  Administered 2021-12-23 – 2021-12-24 (×2): 650 mg via RECTAL
  Filled 2021-12-21 (×2): qty 1

## 2021-12-21 MED ORDER — SODIUM CHLORIDE 0.9 % IV SOLN
2.0000 g | Freq: Once | INTRAVENOUS | Status: AC
  Administered 2021-12-21: 2 g via INTRAVENOUS
  Filled 2021-12-21: qty 12.5

## 2021-12-21 MED ORDER — SODIUM CHLORIDE 0.9 % IV BOLUS
1000.0000 mL | Freq: Once | INTRAVENOUS | Status: AC
  Administered 2021-12-21: 1000 mL via INTRAVENOUS

## 2021-12-21 MED ORDER — ALBUTEROL SULFATE (2.5 MG/3ML) 0.083% IN NEBU: 2.5000 mg | INHALATION_SOLUTION | Freq: Four times a day (QID) | RESPIRATORY_TRACT | Status: DC | PRN

## 2021-12-21 MED ORDER — TRAZODONE HCL 50 MG PO TABS
25.0000 mg | ORAL_TABLET | Freq: Every evening | ORAL | Status: DC | PRN
  Administered 2021-12-22: 25 mg via ORAL
  Filled 2021-12-21: qty 1

## 2021-12-21 MED ORDER — GUAIFENESIN 100 MG/5ML PO LIQD: 400.0000 mg | Freq: Four times a day (QID) | ORAL | Status: DC | PRN

## 2021-12-21 MED ORDER — DICLOFENAC SODIUM 1 % EX GEL
2.0000 g | Freq: Four times a day (QID) | CUTANEOUS | Status: DC
  Administered 2021-12-23 – 2021-12-25 (×5): 2 g via TOPICAL
  Filled 2021-12-21 (×2): qty 100

## 2021-12-21 MED ORDER — POLYETHYLENE GLYCOL 3350 17 G PO PACK: 17.0000 g | PACK | Freq: Every day | ORAL | Status: DC

## 2021-12-21 MED ORDER — DIVALPROEX SODIUM 250 MG PO DR TAB
250.0000 mg | DELAYED_RELEASE_TABLET | Freq: Two times a day (BID) | ORAL | Status: DC
  Administered 2021-12-22: 250 mg via ORAL
  Filled 2021-12-21 (×4): qty 1

## 2021-12-21 MED ORDER — METRONIDAZOLE 500 MG/100ML IV SOLN
500.0000 mg | Freq: Once | INTRAVENOUS | Status: AC
  Administered 2021-12-21: 500 mg via INTRAVENOUS
  Filled 2021-12-21: qty 100

## 2021-12-21 MED ORDER — SODIUM CHLORIDE 0.9 % IV SOLN: 2.0000 g | Freq: Once | INTRAVENOUS | Status: DC

## 2021-12-21 MED ORDER — SODIUM CHLORIDE 0.9 % IV BOLUS
500.0000 mL | Freq: Once | INTRAVENOUS | Status: AC
  Administered 2021-12-21: 500 mL via INTRAVENOUS

## 2021-12-21 MED ORDER — FLUTICASONE PROPIONATE 50 MCG/ACT NA SUSP: 2.0000 | Freq: Every day | NASAL | Status: DC

## 2021-12-21 MED ORDER — SENNOSIDES-DOCUSATE SODIUM 8.6-50 MG PO TABS: 1.0000 | ORAL_TABLET | Freq: Every day | ORAL | Status: DC | PRN

## 2021-12-21 MED ORDER — LIDOCAINE 5 % EX PTCH
1.0000 | MEDICATED_PATCH | CUTANEOUS | Status: DC
  Administered 2021-12-23 – 2021-12-25 (×3): 1 via TRANSDERMAL
  Filled 2021-12-21 (×4): qty 1

## 2021-12-21 MED ORDER — METRONIDAZOLE 500 MG/100ML IV SOLN
500.0000 mg | Freq: Two times a day (BID) | INTRAVENOUS | Status: DC
  Administered 2021-12-22 – 2021-12-25 (×7): 500 mg via INTRAVENOUS
  Filled 2021-12-21 (×8): qty 100

## 2021-12-21 MED ORDER — SODIUM CHLORIDE 0.9 % IV SOLN
2.0000 g | Freq: Two times a day (BID) | INTRAVENOUS | Status: DC
  Administered 2021-12-22 – 2021-12-25 (×7): 2 g via INTRAVENOUS
  Filled 2021-12-21: qty 2
  Filled 2021-12-21: qty 12.5
  Filled 2021-12-21: qty 2
  Filled 2021-12-21 (×4): qty 12.5
  Filled 2021-12-21: qty 2

## 2021-12-21 MED ORDER — BUSPIRONE HCL 5 MG PO TABS: 10.0000 mg | ORAL_TABLET | Freq: Two times a day (BID) | ORAL | Status: DC

## 2021-12-21 MED ORDER — SODIUM CHLORIDE 0.9 % IV SOLN: 2.0000 g | Freq: Two times a day (BID) | INTRAVENOUS | Status: DC

## 2021-12-21 MED ORDER — VANCOMYCIN HCL 1250 MG/250ML IV SOLN
1250.0000 mg | Freq: Once | INTRAVENOUS | Status: AC
  Administered 2021-12-21: 1250 mg via INTRAVENOUS
  Filled 2021-12-21: qty 250

## 2021-12-21 MED ORDER — HALOPERIDOL 2 MG PO TABS: 2.0000 mg | ORAL_TABLET | Freq: Two times a day (BID) | ORAL | Status: DC

## 2021-12-21 MED ORDER — IOHEXOL 300 MG/ML  SOLN
75.0000 mL | Freq: Once | INTRAMUSCULAR | Status: AC | PRN
  Administered 2021-12-21: 75 mL via INTRAVENOUS

## 2021-12-21 MED ORDER — TAMSULOSIN HCL 0.4 MG PO CAPS
0.4000 mg | ORAL_CAPSULE | Freq: Every day | ORAL | Status: DC
  Administered 2021-12-22: 0.4 mg via ORAL
  Filled 2021-12-21: qty 1

## 2021-12-21 MED ORDER — ONDANSETRON HCL 4 MG PO TABS: 4.0000 mg | ORAL_TABLET | Freq: Four times a day (QID) | ORAL | Status: DC | PRN

## 2021-12-21 MED ORDER — TIZANIDINE HCL 4 MG PO TABS: 4.0000 mg | ORAL_TABLET | Freq: Three times a day (TID) | ORAL | Status: DC | PRN

## 2021-12-21 MED ORDER — TIOTROPIUM BROMIDE MONOHYDRATE 18 MCG IN CAPS
1.0000 | ORAL_CAPSULE | Freq: Every day | RESPIRATORY_TRACT | Status: DC
  Administered 2021-12-25: 18 ug via RESPIRATORY_TRACT
  Filled 2021-12-21: qty 5

## 2021-12-21 MED ORDER — VITAMIN D 25 MCG (1000 UNIT) PO TABS
1000.0000 [IU] | ORAL_TABLET | Freq: Every day | ORAL | Status: DC
  Administered 2021-12-24 – 2021-12-25 (×2): 1000 [IU] via ORAL
  Filled 2021-12-21 (×4): qty 1

## 2021-12-21 MED ORDER — PANTOPRAZOLE SODIUM 40 MG PO TBEC: 40.0000 mg | DELAYED_RELEASE_TABLET | Freq: Every day | ORAL | Status: DC

## 2021-12-21 MED ORDER — VANCOMYCIN HCL IN DEXTROSE 1-5 GM/200ML-% IV SOLN: 1000.0000 mg | Freq: Once | INTRAVENOUS | Status: DC

## 2021-12-21 MED ORDER — OLANZAPINE 5 MG PO TABS: 5.0000 mg | ORAL_TABLET | Freq: Two times a day (BID) | ORAL | Status: DC | PRN

## 2021-12-21 MED ORDER — FINASTERIDE 5 MG PO TABS: 5.0000 mg | ORAL_TABLET | Freq: Every day | ORAL | Status: DC

## 2021-12-21 MED ORDER — GABAPENTIN 300 MG PO CAPS: 400.0000 mg | ORAL_CAPSULE | Freq: Three times a day (TID) | ORAL | Status: DC

## 2021-12-21 MED ORDER — ONDANSETRON HCL 4 MG/2ML IJ SOLN
4.0000 mg | Freq: Once | INTRAMUSCULAR | Status: AC | PRN
Start: 2021-12-21 — End: 2021-12-21
  Administered 2021-12-21: 4 mg via INTRAVENOUS
  Filled 2021-12-21: qty 2

## 2021-12-21 MED ORDER — MELATONIN 5 MG PO TABS
5.0000 mg | ORAL_TABLET | Freq: Every day | ORAL | Status: DC
  Administered 2021-12-22 – 2021-12-24 (×3): 5 mg via ORAL
  Filled 2021-12-21 (×3): qty 1

## 2021-12-21 NOTE — Consult Note (Signed)
CODE SEPSIS - PHARMACY COMMUNICATION  **Broad Spectrum Antibiotics should be administered within 1 hour of Sepsis diagnosis**  Time Code Sepsis Called/Page Received: 2113  Antibiotics Ordered: cefepime, vancomycin, metronidazole  Time of 1st antibiotic administration: 2143      Dorothe Pea ,PharmD, BCPS Clinical Pharmacist  12/21/2021  9:15 PM

## 2021-12-21 NOTE — Progress Notes (Signed)
Anticoagulation monitoring(Lovenox):  88 yo male ordered Lovenox 40 mg Q24h    Filed Weights   12/21/21 2236  Weight: 57.6 kg (127 lb)   BMI 22.5   Lab Results  Component Value Date   CREATININE 1.52 (H) 12/21/2021   CREATININE 1.10 09/04/2021   CREATININE 1.25 (H) 09/03/2021   Estimated Creatinine Clearance: 26.5 mL/min (A) (by C-G formula based on SCr of 1.52 mg/dL (H)). Hemoglobin & Hematocrit     Component Value Date/Time   HGB 13.1 12/21/2021 2026   HCT 41.2 12/21/2021 2026     Per Protocol for Patient with estCrcl < 30 ml/min and BMI < 40, will transition to Lovenox 30 mg Q24h.

## 2021-12-21 NOTE — Assessment & Plan Note (Signed)
-   The patient will be admitted to a medical telemetry bed. - We will continue antibiotic therapy with IV cefepime and Flagyl. - Pain management will be provided. - We will follow stool pathogens.

## 2021-12-21 NOTE — Consult Note (Signed)
PHARMACY -  BRIEF ANTIBIOTIC NOTE   Pharmacy has received consult(s) for cefepime and Vancomycin from an ED provider.  The patient's profile has been reviewed for ht/wt/allergies/indication/available labs.    One time order(s) placed for  Vancomycin 1250 mg  Cefepime 2 gram  Further antibiotics/pharmacy consults should be ordered by admitting physician if indicated.                       Thank you, Dorothe Pea, PharmD, BCPS Clinical Pharmacist   12/21/2021  9:16 PM

## 2021-12-21 NOTE — Assessment & Plan Note (Addendum)
-   We will continue BuSpar and, Zoloft, olanzapine and Haldol.

## 2021-12-21 NOTE — Progress Notes (Signed)
Pharmacy Antibiotic Note  Dolton Shaker is a 88 y.o. male admitted on 12/21/2021 with  acute colitis .  Pharmacy has been consulted for Cefepime dosing.  Plan: Cefepime 2 gm IV X 1 given in ED on 9/26 @ 2144. Cefepime 2 gm IV Q12H ordered to continue on 9/27 @ 1000.   Height: 5\' 3"  (160 cm) Weight: 57.6 kg (127 lb) IBW/kg (Calculated) : 56.9  Temp (24hrs), Avg:100.7 F (38.2 C), Min:100.7 F (38.2 C), Max:100.7 F (38.2 C)  Recent Labs  Lab 12/21/21 2026  WBC 19.9*  CREATININE 1.52*  LATICACIDVEN 2.4*    Estimated Creatinine Clearance: 26.5 mL/min (A) (by C-G formula based on SCr of 1.52 mg/dL (H)).    Allergies  Allergen Reactions   Alendronate Sodium    Terazosin     Other reaction(s): Dizziness   Oxycodone     Other reaction(s): Delirium    Antimicrobials this admission:   >>    >>   Dose adjustments this admission:   Microbiology results:  BCx:   UCx:    Sputum:    MRSA PCR:   Thank you for allowing pharmacy to be a part of this patient's care.  Shaelynn Dragos D 12/21/2021 11:36 PM

## 2021-12-21 NOTE — H&P (Addendum)
[facility]   PATIENT NAME: Timothy Noble    MR#:  [id number]  DATE OF BIRTH:  1930-12-27  DATE OF ADMISSION:  12/21/2021  PRIMARY CARE PHYSICIAN: Olin Hauser, DO   Patient is coming from: SNF memory care unit. REQUESTING/REFERRING PHYSICIAN: Ane Payment, MD  CHIEF COMPLAINT:   Chief Complaint  Patient presents with   Emesis    Pt arrived via EMS from [facility] for staff complaints of n/v/d since 1500 today nonstop. Per EMS T101 upon arrival. Pt demented at baseline resides on memory care unit.    Nausea    HISTORY OF PRESENT ILLNESS:  Timothy Noble is a 88 y.o. male with medical history significant for dementia, GERD along cancer, presented to the ER with acute onset of intractable nausea and vomiting with diarrhea since 3 PM today which has been nonstop.  Per EMS he had a temperature of 101 when they arrived to his memory care.  The patient has been having altered mental status.  Due to his advanced dementia in addition to ultimate status he was unable to provide any history.  He had no reported cough or wheezing or dyspnea.  He had no reported urinary symptoms.  History was obtained by his SNF staff member in the ER.  ED Course: When came to the ER temperature was 100.7 with a heart rate of 85 and later 104 and BP later on was 160/74 with otherwise normal vital signs.  Labs revealed mild hyponatremia and hypochloremia, BUN of 33 with creatinine 1.52 up from 20/1.1 on 09/04/2021.  Lactic acid was 2.4 and CBC showed leukocytosis of 19.9.  Blood cultures were drawn.  UA was unremarkable.  Influenza antigens and COVID-19 PCR came back negative.  GI panel was sent.  EKG as reviewed by me : EKG showed normal sinus rhythm with a rate of 90 with T wave inversion in V2 and V3 and nonspecific intraventricular conduction delay. Imaging: Portable chest x-ray showed pulmonary hypoinflation.  The patient was given IV cefepime, Flagyl and vancomycin and while  0.5 L bolus of IV normal saline.  He will be admitted to a medical telemetry bed for further evaluation and management. PAST MEDICAL HISTORY:   Past Medical History:  Diagnosis Date   Cancer ([facility] [place])    Dementia ([place])    GERD (gastroesophageal reflux disease)    Lung cancer ([place])     PAST SURGICAL HISTORY:   Past Surgical History:  Procedure Laterality Date   CHOLECYSTECTOMY     LUNG SURGERY Left    rotator cuff surgery      SOCIAL HISTORY:   Social History   Tobacco Use   Smoking status: Former    Types: Cigarettes    Quit date: 06/01/1979    Years since quitting: 42.5   Smokeless tobacco: Never  Substance Use Topics   Alcohol use: Never    FAMILY HISTORY:   Family History  Family history unknown: Yes  Unobtainable as the patient is nonverbal.  DRUG ALLERGIES:   Allergies  Allergen Reactions   Alendronate Sodium    Terazosin     Other reaction(s): Dizziness   Oxycodone     Other reaction(s): Delirium    REVIEW OF SYSTEMS:   ROS As per history of present illness otherwise unobtainable as the patient is nonverbal.  MEDICATIONS AT HOME:   Prior to Admission medications   Medication Sig Start Date End Date Taking? Authorizing Provider  acetaminophen (TYLENOL) 325 MG tablet  TAKE THREE TABLETS BY MOUTH EVERY 8 HOURS AS NEEDED FOR PAIN OR FEVER EACH TABLET CONTAINS 325MG  ACETAMINOPHEN (TYLENOL,APAP). MAXIMUM DAILY RECOMMENDED DOSE IS 3000MG  04/16/20   [provider]  albuterol (PROVENTIL) (2.5 MG/3ML) 0.083% nebulizer solution Take 2.5 mg by nebulization every 6 (six) hours as needed for wheezing or shortness of breath.    [provider]  ascorbic acid (VITAMIN C) 500 MG tablet Take 1 tablet by mouth daily. Patient not taking: Reported on 09/04/2021 08/16/05   [provider]  budesonide-formoterol (SYMBICORT) 160-4.5 MCG/ACT inhaler Inhale into the lungs. Patient not taking: Reported on 03/30/2021    [provider]  busPIRone  (BUSPAR) 10 MG tablet Take 10 mg by mouth 2 (two) times daily.    [provider]  busPIRone (BUSPAR) 10 MG tablet Take 1 tablet (10 mg total) by mouth 2 (two) times daily. 09/13/21   Vladimir Crofts, MD  calcium-vitamin D (OSCAL WITH D) 500-200 MG-UNIT TABS tablet Take 1 tablet by mouth daily. Patient not taking: Reported on 09/04/2021    [provider]  cetirizine (ZYRTEC) 10 MG tablet Take 10 mg by mouth daily. Patient not taking: Reported on 09/04/2021    [provider]  clotrimazole-betamethasone (LOTRISONE) cream Apply 1-2 times a day. 03/30/21   Jearld Fenton, NP  diclofenac Sodium (VOLTAREN) 1 % GEL APPLY 2 GRAMS TOPICALLY FOUR TIMES A DAY AS DIRECTED 11/25/19   [provider]  divalproex (DEPAKOTE) 250 MG DR tablet Take 1 tablet (250 mg total) by mouth 2 (two) times daily. 09/13/21 09/13/22  Vladimir Crofts, MD  DULoxetine (CYMBALTA) 30 MG capsule Take 30 mg by mouth daily.    [provider]  DULoxetine (CYMBALTA) 30 MG capsule Take 1 capsule (30 mg total) by mouth daily. 09/13/21 09/13/22  Vladimir Crofts, MD  finasteride (PROSCAR) 5 MG tablet Take 5 mg by mouth daily.    [provider]  finasteride (PROSCAR) 5 MG tablet Take 1 tablet (5 mg total) by mouth daily. 09/13/21 09/13/22  Vladimir Crofts, MD  fluticasone (FLONASE) 50 MCG/ACT nasal spray 2 sprays by Each Nare route two (2) times a day as needed for rhinitis.    [provider]  gabapentin (NEURONTIN) 400 MG capsule Take 400 mg by mouth 3 (three) times daily.    [provider]  gabapentin (NEURONTIN) 400 MG capsule Take 1 capsule (400 mg total) by mouth 3 (three) times daily. 09/13/21 09/13/22  Vladimir Crofts, MD  guaifenesin (HUMIBID E) 400 MG TABS tablet Take 1 tablet by mouth every 6 (six) hours as needed. 04/16/20   [provider]  haloperidol (HALDOL) 2 MG tablet Take 1 tablet (2 mg total) by mouth 2 (two) times daily. 09/13/21   Vladimir Crofts, MD  haloperidol  (HALDOL) 2 MG/ML solution Take 2 mg by mouth 2 (two) times daily.    [provider]  lidocaine (LIDODERM) 5 % APPLY 1 PATCH TO SKIN EVERY DAY APPLY ONCE DAILY FOR UP TO 12 HOURS (12 HOURS ON THEN REMOVE FOR 12 HOURS) 03/07/20   [provider]  melatonin 3 MG TABS tablet Take 3 mg by mouth at bedtime.    [provider]  melatonin 5 MG TABS Take 1 tablet (5 mg total) by mouth at bedtime. 09/13/21   Vladimir Crofts, MD  naproxen (NAPROSYN) 500 MG tablet Take 500 mg by mouth daily as needed for mild pain.    [provider]  OLANZapine (ZYPREXA) 5 MG tablet Take 1  tablet (5 mg total) by mouth 2 (two) times daily as needed (agitation). 09/13/21 09/13/22  Vladimir Crofts, MD  omeprazole (PRILOSEC) 20 MG capsule Take 20 mg by mouth daily before breakfast.    [provider]  pantoprazole (PROTONIX) 40 MG tablet Take 1 tablet (40 mg total) by mouth daily. 09/13/21 09/13/22  Vladimir Crofts, MD  polyethylene glycol (MIRALAX / GLYCOLAX) 17 g packet Take 17 g by mouth daily.    [provider]  senna-docusate (SENOKOT-S) 8.6-50 MG tablet Take 1 tablet by mouth daily as needed for mild constipation.    [provider]  sertraline (ZOLOFT) 20 MG/ML concentrated solution Take 50-100 mLs by mouth daily. Take 2.5 mL (50 mg) by mouth daily for 7 days starting 09/02/21, then increase to 5 mL (100 mg) daily, thereafter 09/02/21   [provider]  sertraline (ZOLOFT) 50 MG tablet Take 1 tablet (50 mg total) by mouth daily. 09/13/21 09/13/22  Vladimir Crofts, MD  tamsulosin (FLOMAX) 0.4 MG CAPS capsule Take 0.4 mg by mouth at bedtime.    [provider]  tamsulosin (FLOMAX) 0.4 MG CAPS capsule Take 1 capsule (0.4 mg total) by mouth daily. 09/13/21   Vladimir Crofts, MD  Tiotropium Bromide Monohydrate (SPIRIVA RESPIMAT) 1.25 MCG/ACT AERS Inhale 2 each into the lungs in the morning.    [provider]  tiZANidine (ZANAFLEX) 4 MG tablet Take 1 tablet (4 mg  total) by mouth every 8 (eight) hours as needed for muscle spasms. 07/22/21   Karamalegos, Devonne Doughty, DO  umeclidinium bromide (INCRUSE ELLIPTA) 62.5 MCG/ACT AEPB Inhale 1 puff into the lungs daily. 09/13/21   Vladimir Crofts, MD  VITAMIN D, CHOLECALCIFEROL, PO Take 1,000 Units by mouth daily.    [provider]      VITAL SIGNS:  Blood pressure 136/70, pulse 96, temperature (!) 100.7 F (38.2 C), temperature source Axillary, resp. rate 18, height 5\' 3"  (1.6 m), weight 57.6 kg, SpO2 99 %.  PHYSICAL EXAMINATION:  Physical Exam  GENERAL:  88 y.o.-year-old frail looking Caucasian male patient lying in the bed with no acute distress.  He was fairly somnolent but arousable.  He was responsive to painful stimuli.   EYES: Pupils equal, round, reactive to light and accommodation. No scleral icterus. Extraocular muscles intact.  HEENT: Head atraumatic, normocephalic. Oropharynx and nasopharynx clear.  NECK:  Supple, no jugular venous distention. No thyroid enlargement, no tenderness.  LUNGS: Normal breath sounds bilaterally, no wheezing, rales,rhonchi or crepitation. No use of accessory muscles of respiration.  CARDIOVASCULAR: Regular rate and rhythm, S1, S2 normal. No murmurs, rubs, or gallops.  ABDOMEN: Soft, nondistended, nontender. Bowel sounds present. No organomegaly or mass.  EXTREMITIES: Trace bilateral pitting leg edema, with no cyanosis, or clubbing.  NEUROLOGIC: Cranial nerves II through XII are intact. Muscle strength 5/5 in all extremities. Sensation intact. Gait not checked.  PSYCHIATRIC: The patient is somnolent but arousable and responds only to painful stimuli. SKIN: No obvious rash, lesion, or ulcer.   LABORATORY PANEL:   CBC Recent Labs  Lab 12/21/21 2026  WBC 19.9*  HGB 13.1  HCT 41.2  PLT 247   ------------------------------------------------------------------------------------------------------------------  Chemistries  Recent Labs  Lab 12/21/21 2026  NA  133*  K 5.0  CL 95*  CO2 26  GLUCOSE 144*  BUN 33*  CREATININE 1.52*  CALCIUM 9.6  AST 27  ALT 13  ALKPHOS 58  BILITOT 1.0   ------------------------------------------------------------------------------------------------------------------  Cardiac Enzymes No results for input(s): "TROPONINI" in the last 168  hours. ------------------------------------------------------------------------------------------------------------------  RADIOLOGY:  CT ABDOMEN PELVIS W CONTRAST  Result Date: 12/21/2021 CLINICAL DATA:  Nausea and vomiting. EXAM: CT ABDOMEN AND PELVIS WITH CONTRAST TECHNIQUE: Multidetector CT imaging of the abdomen and pelvis was performed using the standard protocol following bolus administration of intravenous contrast. RADIATION DOSE REDUCTION: This exam was performed according to the departmental dose-optimization program which includes automated exposure control, adjustment of the mA and/or kV according to patient size and/or use of iterative reconstruction technique. CONTRAST:  49mL OMNIPAQUE IOHEXOL 300 MG/ML  SOLN COMPARISON:  CT abdomen and pelvis 06/16/2020 FINDINGS: Lower chest: There is atelectasis in the right lung base. Hepatobiliary: Gallbladder surgically absent. Mild prominence of the biliary system is similar to prior. No focal liver lesions are seen. Pancreas: No pancreatic ductal dilatation or surrounding inflammatory changes. Rounded density with punctate calcification seen in the tail the pancreas measuring 16 mm image 2/30. This is unchanged. Spleen: Normal in size without focal abnormality. Calcified granuloma is present. Adrenals/Urinary Tract: The kidneys and adrenal glands are within normal limits. There is mild diffuse bladder wall thickening, unchanged. Stomach/Bowel: There is wall thickening and surrounding inflammatory stranding of the descending colon to the level the rectum. No bowel obstruction, pneumatosis or free air. The appendix is not seen. Stomach  and small bowel are within normal limits. Vascular/Lymphatic: Aortic atherosclerosis. No enlarged abdominal or pelvic lymph nodes. Reproductive: Prostate gland is enlarged, unchanged. Other: No abdominal wall hernia or abnormality. No abdominopelvic ascites. Musculoskeletal: Degenerative changes affect the spine and hips. IMPRESSION: 1. Wall thickening and inflammation of the descending colon to the level of the rectum compatible with proctocolitis. 2. Stable prostatomegaly with chronic bladder outlet obstruction. 3. Rounded lesion in the pancreatic tail measures 16 mm, indeterminate. Recommend further evaluation with MRI of the pancreas. 4.  Aortic Atherosclerosis (ICD10-I70.0). Electronically Signed   By: Ronney Asters M.D.   On: 12/21/2021 21:28   DG Chest Port 1 View  Result Date: 12/21/2021 CLINICAL DATA:  Fever EXAM: PORTABLE CHEST 1 VIEW COMPARISON:  07/10/2019 FINDINGS: Lung volumes are small and pulmonary insufflation has diminished since prior examination. Minimal left basilar atelectasis or scarring is present. Probable chronic underlying interstitial changes. No pneumothorax or pleural effusion. Cardiac size within normal limits. Pulmonary vascularity is normal. No acute bone abnormality. Multiple remote left rib fractures are noted. IMPRESSION: Pulmonary hypoinflation. Electronically Signed   By: Fidela Salisbury M.D.   On: 12/21/2021 21:02      IMPRESSION AND PLAN:  Assessment and Plan: * Acute colitis - The patient will be admitted to a medical telemetry bed. - We will continue antibiotic therapy with IV cefepime and Flagyl. - Pain management will be provided. - We will follow stool pathogens.  Sepsis due to undetermined organism [facility]) - This is manifested by fever, tachycardia and  leukocytosis and is clearly secondary to her acute colitis.. - We will follow blood cultures as well as stool pathogens - We will continue antibiotic therapy with IV cefepime and Flagyl. - We will  continue hydration with IV normal saline.  AKI (acute kidney injury) ([place]) - This is associated with hyponatremia. - This is likely hypovolemic due to volume depletion and dehydration from nausea, vomiting and diarrhea. - The patient will be hydrated with IV normal saline and will follow BMP. - We will avoid nephrotoxins.   GERD without esophagitis - We will continue PPI therapy in IV form.  Peripheral neuropathy - We will continue Neurontin and Cymbalta.  Anxiety and depression - We will  continue BuSpar and, Zoloft, olanzapine and Haldol.   DVT prophylaxis: Lovenox.  Advanced Care Planning:  Code Status: full code.  Family Communication:  The plan of care was discussed in details with the patient (and family). I answered all questions. The patient agreed to proceed with the above mentioned plan. Further management will depend upon hospital course. Disposition Plan: Back to previous home environment Consults called: none.  All the records are reviewed and case discussed with ED provider.  Status is: Inpatient   At the time of the admission, it appears that the appropriate admission status for this patient is inpatient.  This is judged to be reasonable and necessary in order to provide the required intensity of service to ensure the patient's safety given the presenting symptoms, physical exam findings and initial radiographic and laboratory data in the context of comorbid conditions.  The patient requires inpatient status due to high intensity of service, high risk of further deterioration and high frequency of surveillance required.  I certify that at the time of admission, it is my clinical judgment that the patient will require inpatient hospital care extending more than 2 midnights.                            Dispo: The patient is from: Home              Anticipated d/c is to: Home              Patient currently is not medically stable to d/c.              Difficult to place  patient: No  Christel Mormon M.D on 12/21/2021 at 11:11 PM  [facility]   From 7 PM-7 AM, contact night-coverage [email or web address]  CC: Primary care physician; Olin Hauser, DO

## 2021-12-21 NOTE — Assessment & Plan Note (Signed)
Secondary to sepsis.  With fluid resuscitation, creatinine improved.

## 2021-12-21 NOTE — ED Provider Notes (Signed)
[facility] Provider Note    Event Date/Time   First MD Initiated Contact with Patient 12/21/21 2018     (approximate)   History   Emesis (Pt arrived via EMS from [place] for staff complaints of n/v/d since 1500 today nonstop. Per EMS T101 upon arrival. Pt demented at baseline resides on memory care unit. ) and Nausea   HPI  Timothy Noble is a 88 y.o. male with a history of dementia, lung cancer, and GERD who presents with nausea, vomiting, and diarrhea.  He was also noted to be febrile today.  Per EMS, he is at his baseline mental status.  The patient himself is unable to give any history.    Physical Exam   Triage Vital Signs: ED Triage Vitals  Enc Vitals Group     BP 12/21/21 2021 112/62     Pulse Rate 12/21/21 2021 85     Resp 12/21/21 2021 16     Temp 12/21/21 2021 (!) 100.7 F (38.2 C)     Temp Source 12/21/21 2021 Axillary     SpO2 12/21/21 2021 92 %     Weight --      Height 12/21/21 2022 5\' 3"  (1.6 m)     Head Circumference --      Peak Flow --      Pain Score --      Pain Loc --      Pain Edu? --      Excl. in [place]? --     Most recent vital signs: Vitals:   12/21/21 2200 12/21/21 2300  BP: (!) 145/75 136/70  Pulse: 98 96  Resp: 14 18  Temp:    SpO2: 91% 99%     General: Awake, minimally responsive except to painful stimuli.  Frail appearing. CV:  Good peripheral perfusion.  Resp:  Normal effort.  Lungs CTAB. Abd:  Soft with no focal tenderness.  No distention.  Other:  EOMI.  PERRLA.  Motor intact in all extremities.  Somewhat dry mucous membranes.   ED Results / Procedures / Treatments   Labs (all labs ordered are listed, but only abnormal results are displayed) Labs Reviewed  COMPREHENSIVE METABOLIC PANEL - Abnormal; Notable for the following components:      Result Value   Sodium 133 (*)    Chloride 95 (*)    Glucose, Bld 144 (*)    BUN 33 (*)    Creatinine, Ser 1.52 (*)    GFR, Estimated 44 (*)     All other components within normal limits  CBC - Abnormal; Notable for the following components:   WBC 19.9 (*)    All other components within normal limits  URINALYSIS, ROUTINE W REFLEX MICROSCOPIC - Abnormal; Notable for the following components:   Color, Urine YELLOW (*)    APPearance CLEAR (*)    All other components within normal limits  LACTIC ACID, PLASMA - Abnormal; Notable for the following components:   Lactic Acid, Venous 2.4 (*)    All other components within normal limits  RESP PANEL BY RT-PCR (FLU A&B, COVID) ARPGX2  C DIFFICILE QUICK SCREEN W PCR REFLEX    CULTURE, BLOOD (ROUTINE X 2)  CULTURE, BLOOD (ROUTINE X 2)  GASTROINTESTINAL PANEL BY PCR, STOOL (REPLACES STOOL CULTURE)  LIPASE, BLOOD  LACTIC ACID, PLASMA  PROTIME-INR  CORTISOL-AM, BLOOD  PROCALCITONIN  BASIC METABOLIC PANEL  CBC  TROPONIN I (HIGH SENSITIVITY)  TROPONIN I (HIGH SENSITIVITY)  EKG  ED ECG REPORT I, Arta Silence, the attending physician, personally viewed and interpreted this ECG.  Date: 12/21/2021 EKG Time: 2048 Rate: 90 Rhythm: normal sinus rhythm QRS Axis: normal Intervals: Nonspecific IVCD ST/T Wave abnormalities: Nonspecific ST abnormalities Narrative Interpretation: Nonspecific abnormalities with no evidence of acute ischemia; machine read indicates acute MI, however morphology is overall similar to most recent prior EKG from 09/03/2021    RADIOLOGY  I independently viewed and interpreted the images for the following studies:  Chest x-ray: No focal consolidation or edema  CT abdomen/pelvis: There are no dilated bowel loops or any free air or free fluid.  There is wall thickening in the descending colon consistent with proctocolitis.  PROCEDURES:  Critical Care performed: No  Procedures   MEDICATIONS ORDERED IN ED: Medications  vancomycin (VANCOREADY) IVPB 1250 mg/250 mL (has no administration in time range)  busPIRone (BUSPAR) tablet 10 mg (has no  administration in time range)  DULoxetine (CYMBALTA) DR capsule 30 mg (has no administration in time range)  haloperidol (HALDOL) tablet 2 mg (has no administration in time range)  OLANZapine (ZYPREXA) tablet 5 mg (has no administration in time range)  sertraline (ZOLOFT) tablet 50 mg (has no administration in time range)  pantoprazole (PROTONIX) EC tablet 40 mg (has no administration in time range)  polyethylene glycol (MIRALAX / GLYCOLAX) packet 17 g (has no administration in time range)  senna-docusate (Senokot-S) tablet 1 tablet (has no administration in time range)  finasteride (PROSCAR) tablet 5 mg (has no administration in time range)  tamsulosin (FLOMAX) capsule 0.4 mg (0.4 mg Oral Not Given 12/21/21 2303)  melatonin tablet 5 mg (5 mg Oral Not Given 12/21/21 2304)  divalproex (DEPAKOTE) DR tablet 250 mg (has no administration in time range)  gabapentin (NEURONTIN) capsule 400 mg (has no administration in time range)  tiZANidine (ZANAFLEX) tablet 4 mg (has no administration in time range)  cholecalciferol (VITAMIN D3) 25 MCG (1000 UNIT) tablet 1,000 Units (has no administration in time range)  albuterol (PROVENTIL) (2.5 MG/3ML) 0.083% nebulizer solution 2.5 mg (has no administration in time range)  fluticasone (FLONASE) 50 MCG/ACT nasal spray 2 spray (has no administration in time range)  guaiFENesin (ROBITUSSIN) 100 MG/5ML liquid 400 mg (has no administration in time range)  Tiotropium Bromide Monohydrate AERS 2 each (has no administration in time range)  lidocaine (LIDODERM) 5 % 1 patch (has no administration in time range)  diclofenac Sodium (VOLTAREN) 1 % topical gel 2 g (has no administration in time range)  enoxaparin (LOVENOX) injection 40 mg (has no administration in time range)  metroNIDAZOLE (FLAGYL) IVPB 500 mg (has no administration in time range)  acetaminophen (TYLENOL) tablet 650 mg (has no administration in time range)    Or  acetaminophen (TYLENOL) suppository 650 mg  (has no administration in time range)  traZODone (DESYREL) tablet 25 mg (has no administration in time range)  ondansetron (ZOFRAN) tablet 4 mg (has no administration in time range)    Or  ondansetron (ZOFRAN) injection 4 mg (has no administration in time range)  ondansetron (ZOFRAN) injection 4 mg (4 mg Intravenous Given 12/21/21 2037)  sodium chloride 0.9 % bolus 500 mL (0 mLs Intravenous Stopped 12/21/21 2140)  iohexol (OMNIPAQUE) 300 MG/ML solution 75 mL (75 mLs Intravenous Contrast Given 12/21/21 2119)  ceFEPIme (MAXIPIME) 2 g in sodium chloride 0.9 % 100 mL IVPB (0 g Intravenous Stopped 12/21/21 2222)  metroNIDAZOLE (FLAGYL) IVPB 500 mg (0 mg Intravenous Stopped 12/21/21 2303)  sodium chloride 0.9 % bolus  1,000 mL (0 mLs Intravenous Stopped 12/21/21 2303)     IMPRESSION / MDM / ASSESSMENT AND PLAN / ED COURSE  I reviewed the triage vital signs and the nursing notes.  88 year old male with PMH as noted above presents with nausea, vomiting, diarrhea, and fever.  The patient is unable to give any history.  He is apparently at his baseline mental status per EMS.  Physical exam is unremarkable for acute findings except for fever.  EKG shows nonspecific but possible ischemic abnormalities although morphology is overall similar to prior EKG.  Differential diagnosis includes, but is not limited to, gastroenteritis, colitis, diverticulitis, UTI, COVID-19, other acute infection, electrolyte abnormality, AKI, other metabolic disturbance, less likely primary cardiac or CNS cause.  We will obtain CT abdomen/pelvis, chest x-ray, lab work-up, and reassess.  Patient's presentation is most consistent with acute presentation with potential threat to life or bodily function.  The patient is on the cardiac monitor to evaluate for evidence of arrhythmia and/or significant heart rate changes.  ----------------------------------------- 10:17 PM on 12/21/2021 -----------------------------------------  CT  shows evidence of colitis.  The patient has leukocytosis and elevated lactate.  He meets sepsis criteria.  I have ordered broad-spectrum antibiotics and fluids per the sepsis protocol.  The patient is hemodynamically stable.  I consulted Dr. Sidney Ace from the hospitalist service; based on our discussion he agrees to admit the patient.   FINAL CLINICAL IMPRESSION(S) / ED DIAGNOSES   Final diagnoses:  Sepsis, due to unspecified organism, unspecified whether acute organ dysfunction present [facility])  Colitis     Rx / DC Orders   ED Discharge Orders     None        Note:  This document was prepared using Dragon voice recognition software and may include unintentional dictation errors.    Arta Silence, MD 12/21/21 [phone]

## 2021-12-21 NOTE — Assessment & Plan Note (Signed)
-   We will continue Neurontin and Cymbalta.

## 2021-12-21 NOTE — Sepsis Progress Note (Signed)
Elink following Code Sepsis. 

## 2021-12-21 NOTE — Assessment & Plan Note (Addendum)
Met criteria on admission given fever, tachycardia, leukocytosis, colitis source and lactic acidosis.  Being treated aggressively with IV fluids and cefepime and Flagyl.  Starting to improve with improvement in lactic acidosis.  Follow procalcitonin.  Noted white blood cell count went up follow-up patient clinically looks better today

## 2021-12-21 NOTE — Assessment & Plan Note (Signed)
-   We will continue PPI therapy in IV form.

## 2021-12-22 DIAGNOSIS — F0390 Unspecified dementia without behavioral disturbance: Secondary | ICD-10-CM | POA: Diagnosis present

## 2021-12-22 DIAGNOSIS — R739 Hyperglycemia, unspecified: Secondary | ICD-10-CM | POA: Diagnosis present

## 2021-12-22 DIAGNOSIS — K529 Noninfective gastroenteritis and colitis, unspecified: Secondary | ICD-10-CM | POA: Diagnosis not present

## 2021-12-22 DIAGNOSIS — L89152 Pressure ulcer of sacral region, stage 2: Secondary | ICD-10-CM | POA: Diagnosis not present

## 2021-12-22 DIAGNOSIS — A419 Sepsis, unspecified organism: Secondary | ICD-10-CM | POA: Diagnosis not present

## 2021-12-22 DIAGNOSIS — R6520 Severe sepsis without septic shock: Secondary | ICD-10-CM

## 2021-12-22 LAB — BASIC METABOLIC PANEL
Anion gap: 7 (ref 5–15)
BUN: 27 mg/dL — ABNORMAL HIGH (ref 8–23)
CO2: 21 mmol/L — ABNORMAL LOW (ref 22–32)
Calcium: 7.4 mg/dL — ABNORMAL LOW (ref 8.9–10.3)
Chloride: 108 mmol/L (ref 98–111)
Creatinine, Ser: 0.96 mg/dL (ref 0.61–1.24)
GFR, Estimated: >60 mL/min (ref >60)
Glucose, Bld: 125 mg/dL — ABNORMAL HIGH (ref 70–99)
Potassium: 4.1 mmol/L (ref 3.5–5.1)
Sodium: 136 mmol/L (ref 135–145)

## 2021-12-22 LAB — CBC
HCT: 37.7 % — ABNORMAL LOW (ref 39.0–52.0)
Hemoglobin: 11.8 g/dL — ABNORMAL LOW (ref 13.0–17.0)
MCH: 30.6 pg (ref 26.0–34.0)
MCHC: 31.3 g/dL (ref 30.0–36.0)
MCV: 97.7 fL (ref 80.0–100.0)
Platelets: 211 10*3/uL (ref 150–400)
RBC: 3.86 MIL/uL — ABNORMAL LOW (ref 4.22–5.81)
RDW: 14.4 % (ref 11.5–15.5)
WBC: 25.9 10*3/uL — ABNORMAL HIGH (ref 4.0–10.5)
nRBC: 0 % (ref 0.0–0.2)

## 2021-12-22 LAB — PROTIME-INR
INR: 1.2 (ref 0.8–1.2)
Prothrombin Time: 14.6 seconds (ref 11.4–15.2)

## 2021-12-22 LAB — URINALYSIS, ROUTINE W REFLEX MICROSCOPIC
Bilirubin Urine: NEGATIVE
Glucose, UA: NEGATIVE mg/dL
Hgb urine dipstick: NEGATIVE
Ketones, ur: NEGATIVE mg/dL
Leukocytes,Ua: NEGATIVE
Nitrite: NEGATIVE
Protein, ur: NEGATIVE mg/dL
Specific Gravity, Urine: 1.031 — ABNORMAL HIGH (ref 1.005–1.030)
pH: 5 (ref 5.0–8.0)

## 2021-12-22 LAB — CORTISOL-AM, BLOOD: Cortisol - AM: 29.9 ug/dL — ABNORMAL HIGH (ref 6.7–22.6)

## 2021-12-22 LAB — LACTIC ACID, PLASMA: Lactic Acid, Venous: 1.7 mmol/L (ref 0.5–1.9)

## 2021-12-22 LAB — PROCALCITONIN: Procalcitonin: 5.71 ng/mL

## 2021-12-22 LAB — TROPONIN I (HIGH SENSITIVITY): Troponin I (High Sensitivity): 17 ng/L (ref <18)

## 2021-12-22 LAB — GLUCOSE, CAPILLARY: Glucose-Capillary: 127 mg/dL — ABNORMAL HIGH (ref 70–99)

## 2021-12-22 LAB — HEMOGLOBIN A1C
Hgb A1c MFr Bld: 4.7 % — ABNORMAL LOW (ref 4.8–5.6)
Mean Plasma Glucose: 88.19 mg/dL

## 2021-12-22 LAB — CBG MONITORING, ED: Glucose-Capillary: 120 mg/dL — ABNORMAL HIGH (ref 70–99)

## 2021-12-22 MED ORDER — ENOXAPARIN SODIUM 40 MG/0.4ML IJ SOSY
40.0000 mg | PREFILLED_SYRINGE | INTRAMUSCULAR | Status: DC
  Administered 2021-12-22 – 2021-12-24 (×3): 40 mg via SUBCUTANEOUS
  Filled 2021-12-22 (×3): qty 0.4

## 2021-12-22 NOTE — Hospital Course (Addendum)
88 year old male with past medical history of dementia presented from nursing facility with nausea, vomiting and diarrhea that started earlier that afternoon plus fever of 101.  Patient admitted for severe sepsis secondary to colitis.   Patient is treated with cefepime and Flagyl.  Condition appears to be improving, but still has a fever. Called the [facility], will transfer to [facility] for further treatment.

## 2021-12-22 NOTE — Evaluation (Signed)
Clinical/Bedside Swallow Evaluation Patient Details  Name: Timothy Noble MRN: [id number] Date of Birth: 10-30-1930  Today's Date: 12/22/2021 Time: SLP Start Time (ACUTE ONLY): 1335 SLP Stop Time (ACUTE ONLY): 1430 SLP Time Calculation (min) (ACUTE ONLY): 55 min  Past Medical History:  Past Medical History:  Diagnosis Date   Cancer ([place])    Dementia ([place])    GERD (gastroesophageal reflux disease)    Lung cancer ([place])    Past Surgical History:  Past Surgical History:  Procedure Laterality Date   CHOLECYSTECTOMY     LUNG SURGERY Left    rotator cuff surgery     HPI:  Pt is a 88 y.o. male with medical history significant for Dementia, MDD, GERD, cancer, presented to the ER with acute onset of intractable nausea and vomiting with diarrhea since 3 PM today which has been nonstop.  Per EMS he had a temperature of 101 when they arrived to his memory care facility.  The patient has been having altered mental status.  Due to his advanced dementia in addition to ultimate status, he was unable to provide any history.  He is Dependent for all ADLs, mech lift for transfers.  Has eaten a mech soft diet but recently a more Pureed diet per Caregiver present.  CXR: Pulmonary hypoinflation.    Assessment / Plan / Recommendation  Clinical Impression   Pt seen for BSE; he has recently been on a Pureed diet w/ Caregiver feeding support at his Facility prior to admit. Currently, a Caregiver is present. He is dependent for all ADLs at his [place].  Pt awake but eyes closed. Nonverbal. Responded to tactile stim of straw/utensil to lips.  On [place] O2 support, 3L.    Pt appears to present w/ grossly adequate oropharyngeal phase swallow function but w/ Oral Prep deficits (oral acceptance of po's) in setting of declined Cognitive status; Baseline Dementia. Caregiver has reported that pt's Dementia may "worsening", recently. ANY Cognitive decline can impact overall awareness/timing of swallow and safety  during po tasks which increases risk for aspiration, choking. Can also impact accepting of oral intake in general.  Pt's risk for aspiration can be reduced when following general aspiration precautions, supporting him at meals w/ encouragement and cues, and using a modified diet consistency of Puree foods for ease of oral intake overall -- this has been recently done at his Facility.        Pt consumed several trials of thin liquids via straw, and purees No overt clinical s/s of aspiration noted: no decline in respiratory status during/post trials, no cough. O2 sats 97-98% when checked during session. Oral phase was adequate for bolus management and oral clearing of the boluses given. Time given during the Oral Prep stage to recognize and accept the food trials, then during the oral phase for A-P transfer and oral clearing. MOD+ verbal/visual/tactile cues given during this session to support accepting of intake of po trials.  OM Exam was cursory d/t pt's Cognitive decline but appeared [facility] w/ No unilateral weakness noted during bolus management and oral clearing.    In setting of baseline Dementia and Cognitive decline, and risk for choking/aspiration, recommend a more dysphagia level 1(moistened for ease of oral phase) w/ thin liquids; MOD Cues to initiate oral awareness and intake, general aspiration precautions. Reduce Distractions during meals and engage pt during meals for self-feeding if able -- holding cup. Pills Crushed in Puree for safer swallowing. Support w/ feeding at meals. Supervision to encourage oral intake safely.  MD/NSG updated.  ST services recommends follow w/ Palliative Care for [place] and education re: impact of Cognitive decline/Dementia on swallowing and overall intake moving forward -- nutrition/hydration needs. Suspect pt is close to/at his baseline. Precautions posted in room. Education completed w/ Caregiver, NSG and Family that arrived w/ DSS. SLP Visit Diagnosis: Dysphagia, oral  phase (R13.11) (in setting of Baseline Dementia)    Aspiration Risk  Mild aspiration risk;Risk for inadequate nutrition/hydration    Diet Recommendation   dysphagia level 1(moistened for ease of oral phase) w/ thin liquids; MOD Cues to initiate oral awareness and intake, general aspiration precautions. Reduce Distractions during meals and engage pt during meals for self-feeding if able -- holding cup. Support w/ feeding at meals. Supervision to encourage oral intake safely.  Medication Administration: Crushed with puree    Other  Recommendations Recommended Consults:  (Dietician f/u; Palliative Care f/u) Oral Care Recommendations: Oral care BID;Oral care before and after PO;Staff/trained caregiver to provide oral care Other Recommendations:  (n/a)    Recommendations for follow up therapy are one component of a multi-disciplinary discharge planning process, led by the attending physician.  Recommendations may be updated based on patient status, additional functional criteria and insurance authorization.  Follow up Recommendations  (TBD)      Assistance Recommended at Discharge Frequent or constant Supervision/Assistance (feeding)  Functional Status Assessment Patient has had a recent decline in their functional status and/or demonstrates limited ability to make significant improvements in function in a reasonable and predictable amount of time  Frequency and Duration min 2x/week  1 week       Prognosis Prognosis for Safe Diet Advancement: Guarded Barriers to Reach Goals: Cognitive deficits;Language deficits;Time post onset;Severity of deficits      Swallow Study   General Date of Onset: 12/21/21 HPI: Pt is a 88 y.o. male with medical history significant for Dementia, MDD, GERD, cancer, presented to the ER with acute onset of intractable nausea and vomiting with diarrhea since 3 PM today which has been nonstop.  Per EMS he had a temperature of 101 when they arrived to his memory care  facility.  The patient has been having altered mental status.  Due to his advanced dementia in addition to ultimate status, he was unable to provide any history.  He is Dependent for all ADLs, mech lift for transfers.  Has eaten a mech soft diet but recently a more Pureed diet per Caregiver present.  CXR: Pulmonary hypoinflation. Type of Study: Bedside Swallow Evaluation Previous Swallow Assessment: none Diet Prior to this Study: Regular;Thin liquids Temperature Spikes Noted: No (wbc elevated) Respiratory Status: Nasal cannula (3L) History of Recent Intubation: No Behavior/Cognition: Alert;Cooperative;Pleasant mood;Confused;Distractible;Requires cueing;Doesn't follow directions (Eyes closed) Oral Cavity Assessment:  (CNT d/t Cognitive decline) Oral Care Completed by SLP: No Oral Cavity - Dentition: Missing dentition Vision:  (n/a) Self-Feeding Abilities: Total assist Patient Positioning: Upright in bed (needed full assistance to sit upright in bed for po trials) Baseline Vocal Quality:  (nonverbal) Volitional Cough: Cognitively unable to elicit Volitional Swallow: Unable to elicit    Oral/Motor/Sensory Function Overall Oral Motor/Sensory Function: Within functional limits (no unlateral weakness w/ oral movements during po trials)   Ice Chips Ice chips: Not tested   Thin Liquid Thin Liquid: Within functional limits Presentation: Straw (fed; 10+ trials) Other Comments: straw placed at lips to give cue for sipping; cues needed intermittently    Nectar Thick Nectar Thick Liquid: Not tested   Honey Thick Honey Thick Liquid: Not tested  Puree Puree: Within functional limits Presentation: Spoon (fed; 10 trials) Other Comments: tactile cues of spoon to lips needed   Solid     Solid: Not tested Other Comments: not orally attentive enough for engagement        Orinda Kenner, [place], [place]; [place] [phone]  (ascom) Damarkus Balis 12/22/2021,4:59 PM

## 2021-12-22 NOTE — ED Notes (Signed)
Pt resting in bed with eyes closed. Pt does not appear to be able to safely swallow. Home health aide states pt only eats pureed foods. Pt rouses to voice then immediately falls asleep. Prtovider made aware. Will bladder scan pt and check CBG.

## 2021-12-22 NOTE — ED Notes (Signed)
Social worker at bedside.

## 2021-12-22 NOTE — ED Notes (Addendum)
DSS sitter at bedside, breakfast was brought, pt still with eyes closed, does not follow commands. Will wait for now to try PO intake.

## 2021-12-22 NOTE — ED Notes (Signed)
CBG 120 

## 2021-12-22 NOTE — Assessment & Plan Note (Signed)
Present on admission.  Wound care to see.

## 2021-12-22 NOTE — Progress Notes (Signed)
[facility] Progress Note  Patient: Timothy Noble    EPP:[id number]  DOA: 12/21/2021    Date of Service: the patient was seen and examined on 12/22/2021  Brief hospital course: 88 year old male with past medical history of dementia presented from nursing facility with nausea, vomiting and diarrhea that started earlier that afternoon plus fever of 101.  Patient admitted for severe sepsis secondary to colitis.    Assessment and Plan: Assessment and Plan: * Severe sepsis ([place]) secondary to colitis Met criteria on admission given fever, tachycardia, leukocytosis, colitis source and lactic acidosis.  Being treated aggressively with IV fluids and cefepime and Flagyl.  Starting to improve with improvement in lactic acidosis.  Follow procalcitonin.  Noted white blood cell count went up follow-up patient clinically looks better today  AKI (acute kidney injury) (HCC)-resolved as of 12/22/2021 Secondary to sepsis.  With fluid resuscitation, creatinine improved.  Dementia without behavioral disturbance ([place]) Appears to be at baseline.  Stable.  Decubitus ulcer of sacral region, stage 2 ([place]) Present on admission.  Wound care to see.  Anxiety and depression - We will continue BuSpar and, Zoloft, olanzapine and Haldol.  Peripheral neuropathy - We will continue Neurontin and Cymbalta.  Monitor for oversedation.  Hyperglycemia No history of diabetes.  Checking A1c.  GERD without esophagitis - We will continue PPI therapy in IV form.       Body mass index is 22.5 kg/m.                    Pressure Injury 12/22/21 Sacrum Mid Stage 2 -  Partial thickness loss of dermis presenting as a shallow open injury with a red, pink wound bed without slough. area in center of stage 1 PI on sacrum is stage 2 (Active)  12/22/21 1103  Location: Sacrum  Location Orientation: Mid  Staging: Stage 2 -  Partial thickness loss of dermis presenting as a shallow open injury with a red, pink wound bed  without slough.  Wound Description (Comments): area in center of stage 1 PI on sacrum is stage 2  Present on Admission: Yes     Consultants: None  Procedures: None  Antimicrobials: IV for pain and Flagyl 9/26-present  Code Status: Full code   Subjective: Patient not really verbal or awake  Objective: Vital signs were reviewed and unremarkable. Vitals:   12/22/21 1410 12/22/21 1500  BP:  (!) 128/110  Pulse:  91  Resp:  17  Temp: 99.5 F (37.5 C)   SpO2:  96%    Intake/Output Summary (Last 24 hours) at 12/22/2021 1635 Last data filed at 12/22/2021 1151 Gross per 24 hour  Intake 100 ml  Output 200 ml  Net -100 ml   Filed Weights   12/21/21 2236  Weight: 57.6 kg   Body mass index is 22.5 kg/m.  Exam:  General: Somnolent HEENT: Normal cephalic, atraumatic, mucous membranes slightly dry Cardiovascular: Regular rate and rhythm, S1-S2, occasional ectopic beat Respiratory: Clear to auscultation bilaterally Abdomen: Soft, nontender, nondistended, hypoactive bowel sounds Musculoskeletal: Clubbing or cyanosis, trace pitting edema Skin: Noted stage II sacral decubitus ulcer Psychiatry: Chronic underlying dementia Neurology: No overt focal deficits  Data Reviewed: Noted procalcitonin level 5.71.  Worsening white blood cell count 25.9 although lactic acid level has since improved.  Disposition:  Status is: Inpatient Remains inpatient appropriate because:  -Continue treatment of infection -As per guardian's request, working on transfer to the [place]    Anticipated discharge date: 9/28 possibly for transfer  Family Communication:  Attempted to call guardian, no voicemail available DVT Prophylaxis: enoxaparin (LOVENOX) injection 40 mg Start: 12/22/21 2200    Author: Annita Brod ,MD 12/22/2021 4:35 PM  To reach On-call, see care teams to locate the attending and reach out via [email or web address]. Between 7PM-7AM, please contact night-coverage If you still have  difficulty reaching the attending provider, please page the [facility] (Director on Call) for [facility] on amion for assistance.

## 2021-12-22 NOTE — ED Notes (Signed)
Daughter Timothy Noble (approved by DSS) at bedside. Other family members approved to visit are Timothy Noble. Lorrie (other daughter) is not allowed to visit unsupervised (by DSS). DSS guardian info in chart and in earlier note.

## 2021-12-22 NOTE — ED Notes (Signed)
DSS guardian called for update. She will be here to see pt in about 1 hour. Contact info is [name], work cell [phone], office number is [phone]. She is asking if pt can be transferred to [place] her will pass along msg to attending provider. Dr Caren Macadam.

## 2021-12-22 NOTE — Plan of Care (Signed)

## 2021-12-22 NOTE — Assessment & Plan Note (Signed)
Appears to be at baseline.  Stable.

## 2021-12-22 NOTE — ED Notes (Signed)
Urine sample sent

## 2021-12-22 NOTE — Progress Notes (Signed)
CSW received call from [place] guardian of patient Timothy Noble at [phone] who reports they are requesting patient be transferred to [facility]. CSW has relayed request to MD.   Pricilla Riffle, LCSW [phone]

## 2021-12-22 NOTE — Assessment & Plan Note (Signed)
No history of diabetes.  Checking A1c.

## 2021-12-23 ENCOUNTER — Inpatient Hospital Stay: Payer: No Typology Code available for payment source

## 2021-12-23 DIAGNOSIS — L89152 Pressure ulcer of sacral region, stage 2: Secondary | ICD-10-CM | POA: Diagnosis not present

## 2021-12-23 DIAGNOSIS — K529 Noninfective gastroenteritis and colitis, unspecified: Secondary | ICD-10-CM | POA: Diagnosis not present

## 2021-12-23 DIAGNOSIS — F0390 Unspecified dementia without behavioral disturbance: Secondary | ICD-10-CM | POA: Diagnosis not present

## 2021-12-23 DIAGNOSIS — A419 Sepsis, unspecified organism: Secondary | ICD-10-CM | POA: Diagnosis not present

## 2021-12-23 DIAGNOSIS — K869 Disease of pancreas, unspecified: Secondary | ICD-10-CM

## 2021-12-23 LAB — CBC
HCT: 31.3 % — ABNORMAL LOW (ref 39.0–52.0)
Hemoglobin: 10.3 g/dL — ABNORMAL LOW (ref 13.0–17.0)
MCH: 31.2 pg (ref 26.0–34.0)
MCHC: 32.9 g/dL (ref 30.0–36.0)
MCV: 94.8 fL (ref 80.0–100.0)
Platelets: 168 10*3/uL (ref 150–400)
RBC: 3.3 MIL/uL — ABNORMAL LOW (ref 4.22–5.81)
RDW: 14.2 % (ref 11.5–15.5)
WBC: 18 10*3/uL — ABNORMAL HIGH (ref 4.0–10.5)
nRBC: 0 % (ref 0.0–0.2)

## 2021-12-23 LAB — BASIC METABOLIC PANEL
Anion gap: 5 (ref 5–15)
BUN: 27 mg/dL — ABNORMAL HIGH (ref 8–23)
CO2: 25 mmol/L (ref 22–32)
Calcium: 8.7 mg/dL — ABNORMAL LOW (ref 8.9–10.3)
Chloride: 105 mmol/L (ref 98–111)
Creatinine, Ser: 0.97 mg/dL (ref 0.61–1.24)
GFR, Estimated: >60 mL/min (ref >60)
Glucose, Bld: 99 mg/dL (ref 70–99)
Potassium: 4 mmol/L (ref 3.5–5.1)
Sodium: 135 mmol/L (ref 135–145)

## 2021-12-23 LAB — PROCALCITONIN: Procalcitonin: 6.62 ng/mL

## 2021-12-23 LAB — LACTIC ACID, PLASMA: Lactic Acid, Venous: 0.6 mmol/L (ref 0.5–1.9)

## 2021-12-23 MED ORDER — SERTRALINE HCL 50 MG PO TABS
100.0000 mg | ORAL_TABLET | Freq: Every day | ORAL | Status: DC
  Administered 2021-12-23 – 2021-12-24 (×2): 100 mg via ORAL
  Filled 2021-12-23 (×2): qty 2

## 2021-12-23 MED ORDER — HALOPERIDOL 2 MG PO TABS
2.0000 mg | ORAL_TABLET | Freq: Two times a day (BID) | ORAL | Status: DC
  Administered 2021-12-23 – 2021-12-25 (×4): 2 mg via ORAL
  Filled 2021-12-23 (×4): qty 1

## 2021-12-23 MED ORDER — DIVALPROEX SODIUM 125 MG PO CSDR
250.0000 mg | DELAYED_RELEASE_CAPSULE | Freq: Two times a day (BID) | ORAL | Status: DC
  Administered 2021-12-23 – 2021-12-25 (×4): 250 mg via ORAL
  Filled 2021-12-23 (×4): qty 2

## 2021-12-23 NOTE — Progress Notes (Signed)
[place] [facility]) Hospital Liaison note:  This patient is currently enrolled in [facility] outpatient-based Palliative Care. Will continue to follow for disposition.  Please call with any outpatient palliative questions or concerns.  Thank you, Lorelee Market, LPN [facility] Liaison [phone]

## 2021-12-23 NOTE — Progress Notes (Signed)
[facility] Progress Note  Patient: Timothy Noble    EGB:[id number]  DOA: 12/21/2021    Date of Service: the patient was seen and examined on 12/23/2021  Brief hospital course: 88 year old male with past medical history of dementia presented from nursing facility with nausea, vomiting and diarrhea that started earlier that afternoon plus fever of 101.  Patient admitted for severe sepsis secondary to colitis.    Assessment and Plan: Assessment and Plan: * Severe sepsis (DISH) secondary to colitis Met criteria on admission given fever, tachycardia, leukocytosis, colitis source and lactic acidosis.  Being treated aggressively with IV fluids and cefepime and Flagyl.  Sepsis itself resolved.  While white blood cell count went down, procalcitonin level slightly went up.  Recheck labs in the morning.  AKI (acute kidney injury) (HCC)-resolved as of 12/22/2021 Secondary to sepsis.  With fluid resuscitation, creatinine improved.  Dementia without behavioral disturbance ([place]) Appears to be at baseline.  Stable.  Seen by speech therapy and placed on a dysphagia 1 diet with thin liquids.  They will continue to follow.  Decubitus ulcer of sacral region, stage 2 ([place]) Present on admission.  Wound care to see.  Anxiety and depression - We will continue BuSpar and, Zoloft, olanzapine and Haldol.  Peripheral neuropathy - We will continue Neurontin and Cymbalta.  Monitor for oversedation.  Hyperglycemia No history of diabetes.  A1c of 4.7.  GERD without esophagitis - We will continue PPI therapy in IV form.  Pancreatic lesion Incidental lesion noted in tail of pancreas.  Unrelated to colitis.  Check pancreatic MRI once colitis better treated.  Although, had conversation with patient's DSS worker.  Checking MRI would not give diagnostic confirmation, but would require a biopsy.  Given patient's multiple medical issues including dementia as well as his advanced age, would there be any benefit?  She is  considering this.       Body mass index is 22.5 kg/m.                    Pressure Injury 12/22/21 Sacrum Mid Stage 2 -  Partial thickness loss of dermis presenting as a shallow open injury with a red, pink wound bed without slough. area in center of stage 1 PI on sacrum is stage 2 (Active)  12/22/21 1103  Location: Sacrum  Location Orientation: Mid  Staging: Stage 2 -  Partial thickness loss of dermis presenting as a shallow open injury with a red, pink wound bed without slough.  Wound Description (Comments): area in center of stage 1 PI on sacrum is stage 2  Present on Admission: Yes     Consultants: None  Procedures: None  Antimicrobials: IV for pain and Flagyl 9/26-present  Code Status: Full code   Subjective: Patient a bit more awake, minimally interactive  Objective: Vital signs were reviewed and unremarkable. Vitals:   12/23/21 1418 12/23/21 1553  BP: (!) 158/82 (!) 173/89  Pulse: 97 100  Resp: 17 16  Temp: 98.5 F (36.9 C) 98.9 F (37.2 C)  SpO2: 96% 95%    Intake/Output Summary (Last 24 hours) at 12/23/2021 1555 Last data filed at 12/23/2021 1300 Gross per 24 hour  Intake 508.2 ml  Output 1100 ml  Net -591.8 ml   Filed Weights   12/21/21 2236  Weight: 57.6 kg   Body mass index is 22.5 kg/m.  Exam:  General: Interactive, oriented x1 HEENT: Normal cephalic, atraumatic, mucous membranes slightly dry Cardiovascular: Regular rate and rhythm, S1-S2, occasional ectopic beat Respiratory:  Clear to auscultation bilaterally Abdomen: Soft, nontender, nondistended, hypoactive bowel sounds Musculoskeletal: Clubbing or cyanosis, trace pitting edema Skin: Noted stage II sacral decubitus ulcer Psychiatry: Chronic underlying dementia Neurology: No overt focal deficits  Data Reviewed: A1c of 4.7.  Normalized lactic acid level.  Slightly increasing procalcitonin and improving white blood cell count.  Disposition:  Status is: Inpatient Remains  inpatient appropriate because:  -Continue treatment of infection -As per guardian's request, working on transfer to the [place].  (called number that DSS worker provided: [phone], and no one picked up)    Anticipated discharge date: 10/2 although sooner if patient can be transferred to [place].  DSS worker has requested he not go back to the skilled nursing facility  Family Communication: Updated guardian from [place] at the bedside DVT Prophylaxis: enoxaparin (LOVENOX) injection 40 mg Start: 12/22/21 2200    Author: Annita Brod ,MD 12/23/2021 3:55 PM  To reach On-call, see care teams to locate the attending and reach out via [email or web address]. Between 7PM-7AM, please contact night-coverage If you still have difficulty reaching the attending provider, please page the [facility] (Director on Call) for [facility] on amion for assistance.

## 2021-12-23 NOTE — TOC Progression Note (Signed)
Transition of Care [facility]) - Progression Note    Patient Details  Name: Timothy Noble MRN: [id number] Date of Birth: 1931-01-03  [facility]) CM/SW Contact  [name], RN Phone Number: 12/23/2021, 3:31 PM  Clinical Narrative:     Spoke with DSS guardian of patient Timothy Noble at [phone]  She confirms she would like transfer to [place] She also states that DSS does not want patient to return to home place They prefer for patient to go to [facility] SN[facility] and they will pursue PT OT eval pending   [facility] Sonia Baller request update from MD, MD notified         Expected Discharge Plan and Services                                                 Social Determinants of Health ([place]) Interventions    Readmission Risk Interventions     No data to display

## 2021-12-23 NOTE — Assessment & Plan Note (Signed)
Incidental lesion noted in tail of pancreas.  Unrelated to colitis.  Check pancreatic MRI once colitis better treated.  Although, had conversation with patient's DSS worker.  Checking MRI would not give diagnostic confirmation, but would require a biopsy.  Given patient's multiple medical issues including dementia as well as his advanced age, would there be any benefit?  She is considering this.

## 2021-12-23 NOTE — Plan of Care (Signed)
GOC consult noted. Patient has [place] guardian. Visitors are at bedside and staff is working with patient at this time. Will follow up tomorrow for further.

## 2021-12-24 DIAGNOSIS — Z7189 Other specified counseling: Secondary | ICD-10-CM

## 2021-12-24 DIAGNOSIS — R6520 Severe sepsis without septic shock: Secondary | ICD-10-CM | POA: Diagnosis not present

## 2021-12-24 DIAGNOSIS — A419 Sepsis, unspecified organism: Secondary | ICD-10-CM | POA: Diagnosis not present

## 2021-12-24 DIAGNOSIS — N179 Acute kidney failure, unspecified: Secondary | ICD-10-CM | POA: Diagnosis not present

## 2021-12-24 DIAGNOSIS — K529 Noninfective gastroenteritis and colitis, unspecified: Secondary | ICD-10-CM | POA: Diagnosis not present

## 2021-12-24 LAB — CBC
HCT: 33.6 % — ABNORMAL LOW (ref 39.0–52.0)
Hemoglobin: 10.9 g/dL — ABNORMAL LOW (ref 13.0–17.0)
MCH: 30.4 pg (ref 26.0–34.0)
MCHC: 32.4 g/dL (ref 30.0–36.0)
MCV: 93.9 fL (ref 80.0–100.0)
Platelets: 186 10*3/uL (ref 150–400)
RBC: 3.58 MIL/uL — ABNORMAL LOW (ref 4.22–5.81)
RDW: 13.9 % (ref 11.5–15.5)
WBC: 19.2 10*3/uL — ABNORMAL HIGH (ref 4.0–10.5)
nRBC: 0 % (ref 0.0–0.2)

## 2021-12-24 LAB — BASIC METABOLIC PANEL
Anion gap: 5 (ref 5–15)
BUN: 28 mg/dL — ABNORMAL HIGH (ref 8–23)
CO2: 24 mmol/L (ref 22–32)
Calcium: 8.7 mg/dL — ABNORMAL LOW (ref 8.9–10.3)
Chloride: 104 mmol/L (ref 98–111)
Creatinine, Ser: 0.93 mg/dL (ref 0.61–1.24)
GFR, Estimated: >60 mL/min (ref >60)
Glucose, Bld: 129 mg/dL — ABNORMAL HIGH (ref 70–99)
Potassium: 3.6 mmol/L (ref 3.5–5.1)
Sodium: 133 mmol/L — ABNORMAL LOW (ref 135–145)

## 2021-12-24 LAB — PROCALCITONIN: Procalcitonin: 3.48 ng/mL

## 2021-12-24 LAB — SARS CORONAVIRUS 2 BY RT PCR: SARS Coronavirus 2 by RT PCR: NEGATIVE

## 2021-12-24 MED ORDER — METRONIDAZOLE 500 MG/100ML IV SOLN: 500.0000 mg | Freq: Two times a day (BID) | INTRAVENOUS | Status: DC

## 2021-12-24 MED ORDER — SODIUM CHLORIDE 0.9 % IV SOLN: 2.0000 g | Freq: Two times a day (BID) | INTRAVENOUS | Status: DC

## 2021-12-24 MED ORDER — SERTRALINE HCL 100 MG PO TABS: 100.0000 mg | ORAL_TABLET | Freq: Every day | ORAL | Status: DC

## 2021-12-24 NOTE — Evaluation (Signed)
Physical Therapy Evaluation Patient Details Name: Timothy Noble MRN: [id number] DOB: [birth year] Today's Date: 12/24/2021  History of Present Illness  Pt is an 88 y/o M admitted on 12/21/21 after presenting with c/o nausea, vomiting, diarrhea & fever. Pt is being treated for sepsis 2/2 colitis. PMH: dementia, lung CA, GERD  Clinical Impression  Pt seen for PT evaluation with co-tx with OT. Pt received asleep in bed with caregiver Helene Kelp) in room. Helene Kelp reports pt has been more lethargic today compared to yesterday. Pt noted to be incontinent of BM & requires total assist +2 for rolling L<>R & total assist for peri hygiene. Pt does not respond to peri hygiene, but starting to arouse by end of session. Pt repositioned in bed with pillow under R hip to off load buttocks. Pt left in care of nurse & caregiver.  Will see pt on a trial basis. At this time recommending LTC upon d/c.     Recommendations for follow up therapy are one component of a multi-disciplinary discharge planning process, led by the attending physician.  Recommendations may be updated based on patient status, additional functional criteria and insurance authorization.  Follow Up Recommendations Long-term institutional care without follow-up therapy Can patient physically be transported by private vehicle: No    Assistance Recommended at Discharge Frequent or constant Supervision/Assistance  Patient can return home with the following  Two people to help with walking and/or transfers;Two people to help with bathing/dressing/bathroom;Help with stairs or ramp for entrance;Direct supervision/assist for medications management;Assistance with feeding;Assist for transportation;Direct supervision/assist for financial management;Assistance with cooking/housework    Equipment Recommendations Hospital bed (hoyer lift & sling)  Recommendations for Other Services       Functional Status Assessment Patient has had a recent decline in their  functional status and demonstrates the ability to make significant improvements in function in a reasonable and predictable amount of time.     Precautions / Restrictions Precautions Precautions: Fall Restrictions Weight Bearing Restrictions: No      Mobility  Bed Mobility Overal bed mobility: Needs Assistance Bed Mobility: Rolling Rolling: Total assist, +2 for physical assistance              Transfers                        Ambulation/Gait                  Stairs            Wheelchair Mobility    Modified Rankin (Stroke Patients Only)       Balance                                             Pertinent Vitals/Pain Pain Assessment Pain Assessment: Faces Faces Pain Scale: Hurts a little bit Pain Location: pt verbalizes "ow" when PT removed telemetry patch off RUE Pain Intervention(s): Repositioned, Monitored during session    Home Living Family/patient expects to be discharged to:: Assisted living                        Prior Function Prior Level of Function : Needs assist             Mobility Comments: Per caregiver in room Helene Kelp), pt required assistance for all mobility tasks (bed mobility, sitting EOB, & used STS lift  to transfer to chair). ADLs Comments: Requires assistance for feeding, bathing, dressing at baseline.     Hand Dominance        Extremity/Trunk Assessment   Upper Extremity Assessment Upper Extremity Assessment: Generalized weakness;Difficult to assess due to impaired cognition    Lower Extremity Assessment Lower Extremity Assessment: Generalized weakness;Difficult to assess due to impaired cognition    Cervical / Trunk Assessment Cervical / Trunk Assessment: Kyphotic  Communication      Cognition Arousal/Alertness: Lethargic   Overall Cognitive Status: History of cognitive impairments - at baseline                                 General Comments: Pt  very lethargic, not responding to peri care. Does report "ow" towards end of session when PT removes RUE telemetry pad, beginning to open eyes towards end of session.        General Comments      Exercises     Assessment/Plan    PT Assessment Patient needs continued PT services  PT Problem List Decreased strength;Decreased range of motion;Decreased activity tolerance;Decreased balance;Decreased mobility;Decreased knowledge of use of DME;Decreased skin integrity       PT Treatment Interventions DME instruction;Gait training;Balance training;Therapeutic exercise;Wheelchair mobility training;Functional mobility training;Cognitive remediation;Neuromuscular re-education;Therapeutic activities;Patient/family education    PT Goals (Current goals can be found in the Care Plan section)  Acute Rehab PT Goals PT Goal Formulation: Patient unable to participate in goal setting Time For Goal Achievement: 01/07/22 Potential to Achieve Goals: Poor    Frequency Min 2X/week     Co-evaluation PT/OT/SLP Co-Evaluation/Treatment: Yes Reason for Co-Treatment: Necessary to address cognition/behavior during functional activity;Complexity of the patient's impairments (multi-system involvement) PT goals addressed during session: Mobility/safety with mobility         AM-PAC PT "6 Clicks" Mobility  Outcome Measure Help needed turning from your back to your side while in a flat bed without using bedrails?: Total Help needed moving from lying on your back to sitting on the side of a flat bed without using bedrails?: Total Help needed moving to and from a bed to a chair (including a wheelchair)?: Total Help needed standing up from a chair using your arms (e.g., wheelchair or bedside chair)?: Total Help needed to walk in hospital room?: Total Help needed climbing 3-5 steps with a railing? : Total 6 Click Score: 6    End of Session   Activity Tolerance: Patient limited by lethargy Patient left: in  bed;with call bell/phone within reach;with nursing/sitter in room Nurse Communication: Mobility status PT Visit Diagnosis: Muscle weakness (generalized) (M62.81);Other abnormalities of gait and mobility (R26.89)    Time: [id number] PT Time Calculation (min) (ACUTE ONLY): 16 min   Charges:   PT Evaluation $PT Eval Moderate Complexity: [place], PT, DPT 12/24/21, 11:05 AM   Waunita Schooner 12/24/2021, 11:03 AM

## 2021-12-24 NOTE — Consult Note (Addendum)
Consultation Note Date: 12/24/2021   Patient Name: Timothy Noble  DOB: 04-Aug-1930  MRN: [id number]  Age / Sex: 88 y.o., male  PCP: Olin Hauser, DO Referring Physician: Sharen Hones, MD  Reason for Consultation: Establishing goals of care  HPI/Patient Profile: 88 year old male with past medical history of dementia presented from nursing facility with nausea, vomiting and diarrhea that started earlier that afternoon plus fever of 101.  Patient admitted for severe sepsis secondary to colitis.   Patient is treated with cefepime and Flagyl.  Condition appears to be improving. [facility], they do not accept patient as patient condition has been improving.  He can be sent to [place] home when ready to discharge.  Clinical Assessment and Goals of Care: Notes and labs reviewed.  Into bedside to see patient.  1 of patient's children is at bedside along with DSS worker Sonia Baller.  Patient is resting with eyes closed.  Daughter discusses that patient is a retired Programmer, multimedia.  She discusses that there are 4 children and considerable family dynamics.  She states that because of false allegations she has to have supervised visits through DSS.  DSS confirms this.  Daughter discusses further the conditions where DSS became involved, and her frustration with this.  Discussed that 4 months ago, patient was walking, talking, and eating.  Daughter showed me pictures on her cell phone of her father and how happy he was and how well he was doing.  She states that pictures were earlier this year.    They state that he developed COVID and was hospitalized at the [place] during the spring.  Following discharge he was placed in [place] with [place] to sit 24 hours a day at [facility].  They discussed that patient initially was still walking, talking, and eating upon admission there.   From a dementia  standpoint, they state patient was still able to somewhat converse.  He asked about daughter spouse and grandson.  He still enjoyed reading. They state a few weeks ago that physically they began having to use a Hoyer lift in order to move him.  They state he developed skin breakdown.  They tell me he has been eating well. DSS states that the goal is to get him to a [place] facility.  DSS tells me that she has been kept updated by the primary team.  She states that he is eating well at this time.  Discussed limits to goals of care.    SUMMARY OF RECOMMENDATIONS    [facility] sends staff into bedside to sit at hospital with patient.  Discussed full code/full scope care at this time.   Discussed further conversations during the admission depending on how he does.   Discussed recommendation for outpatient palliative.     Prognosis:  Unable to determine       Primary Diagnoses: Present on Admission:  Severe sepsis ([place]) secondary to colitis  Anxiety and depression  GERD without esophagitis  (Resolved) AKI (acute kidney  injury) ([place])  Dementia without behavioral disturbance ([place])  Decubitus ulcer of sacral region, stage 2 ([place])  Hyperglycemia  Pancreatic lesion  Hyponatremia   I have reviewed the medical record, interviewed the patient and family, and examined the patient. The following aspects are pertinent.  Past Medical History:  Diagnosis Date   Cancer ([place])    Dementia ([place])    GERD (gastroesophageal reflux disease)    Lung cancer ([place])    Social History   Socioeconomic History   Marital status: Widowed    Spouse name: Not on file   Number of children: Not on file   Years of education: Not on file   Highest education level: Not on file  Occupational History   Not on file  Tobacco Use   Smoking status: Former    Types: Cigarettes    Quit date: 06/01/1979    Years since quitting: 42.5   Smokeless tobacco: Never  Vaping Use   Vaping Use: Never used   Substance and Sexual Activity   Alcohol use: Never   Drug use: Never   Sexual activity: Not on file  Other Topics Concern   Not on file  Social History Narrative   Not on file   Social Determinants of Health   Financial Resource Strain: Not on file  Food Insecurity: No Food Insecurity (12/22/2021)   Hunger Vital Sign    Worried About Running Out of Food in the Last Year: Never true    Ran Out of Food in the Last Year: Never true  Transportation Needs: No Transportation Needs (12/22/2021)   PRAPARE - Hydrologist (Medical): No    Lack of Transportation (Non-Medical): No  Physical Activity: Not on file  Stress: Not on file  Social Connections: Not on file   Family History  Family history unknown: Yes   Scheduled Meds:  cholecalciferol  1,000 Units Oral Daily   diclofenac Sodium  2 g Topical QID   divalproex  250 mg Oral Q12H   enoxaparin (LOVENOX) injection  40 mg Subcutaneous Q24H   haloperidol  2 mg Oral BID   lidocaine  1 patch Transdermal Q24H   melatonin  5 mg Oral QHS   sertraline  100 mg Oral QHS   tiotropium  1 capsule Inhalation QAC breakfast   Continuous Infusions:  ceFEPime (MAXIPIME) IV 2 g (12/24/21 0817)   metronidazole 500 mg (12/24/21 0923)   PRN Meds:.acetaminophen **OR** acetaminophen, albuterol, guaiFENesin, OLANZapine, ondansetron **OR** ondansetron (ZOFRAN) IV, senna-docusate, tiZANidine, traZODone Medications Prior to Admission:  Prior to Admission medications   Medication Sig Start Date End Date Taking? Authorizing Provider  acetaminophen (TYLENOL) 325 MG tablet TAKE THREE TABLETS BY MOUTH EVERY 8 HOURS AS NEEDED FOR PAIN OR FEVER EACH TABLET CONTAINS 325MG  ACETAMINOPHEN (TYLENOL,APAP). MAXIMUM DAILY RECOMMENDED DOSE IS 3000MG  04/16/20  Yes [provider]  diclofenac Sodium (VOLTAREN) 1 % GEL APPLY 2 GRAMS TOPICALLY FOUR TIMES A DAY AS DIRECTED 11/25/19  Yes [provider]  divalproex (DEPAKOTE  SPRINKLE) 125 MG capsule Take 250 mg by mouth 2 (two) times daily.   Yes [provider]  gabapentin (NEURONTIN) 400 MG capsule Take 400 mg by mouth 3 (three) times daily.   Yes [provider]  haloperidol (HALDOL) 2 MG/ML solution Take 2 mg by mouth 2 (two) times daily.   Yes [provider]  lidocaine (LIDODERM) 5 % APPLY 1 PATCH TO SKIN EVERY DAY APPLY ONCE DAILY FOR UP TO 12 HOURS (12  HOURS ON THEN REMOVE FOR 12 HOURS) 03/07/20  Yes [provider]  melatonin 5 MG TABS Take 1 tablet (5 mg total) by mouth at bedtime. 09/13/21  Yes Vladimir Crofts, MD  pantoprazole (PROTONIX) 40 MG tablet Take 1 tablet (40 mg total) by mouth daily. 09/13/21 09/13/22 Yes Vladimir Crofts, MD  psyllium (REGULOID) 0.52 g capsule Take 0.52 g by mouth in the morning and at bedtime.   Yes [provider]  senna-docusate (SENOKOT-S) 8.6-50 MG tablet Take 1 tablet by mouth daily as needed for mild constipation.   Yes [provider]  sertraline (ZOLOFT) 20 MG/ML concentrated solution Take 50-100 mLs by mouth daily. Take 2.5 mL (50 mg) by mouth daily for 7 days starting 09/02/21, then increase to 5 mL (100 mg) daily, thereafter 09/02/21  Yes [provider]  Tiotropium Bromide Monohydrate (SPIRIVA RESPIMAT) 1.25 MCG/ACT AERS Inhale 2 each into the lungs in the morning.   Yes [provider]  traMADol (ULTRAM) 50 MG tablet Take 50 mg by mouth 2 (two) times daily.   Yes [provider]  albuterol (PROVENTIL) (2.5 MG/3ML) 0.083% nebulizer solution Take 2.5 mg by nebulization every 6 (six) hours as needed for wheezing or shortness of breath.    [provider]  tamsulosin (FLOMAX) 0.4 MG CAPS capsule Take 0.4 mg by mouth at bedtime. Patient not taking: Reported on 12/21/2021    [provider]  tiZANidine (ZANAFLEX) 4 MG tablet Take 1 tablet (4 mg total) by mouth every 8 (eight) hours as needed for muscle spasms. Patient not taking: Reported  on 12/21/2021 07/22/21   Olin Hauser, DO  VITAMIN D, CHOLECALCIFEROL, PO Take 1,000 Units by mouth daily. Patient not taking: Reported on 12/21/2021    [provider]   Allergies  Allergen Reactions   Alendronate Sodium    Terazosin     Other reaction(s): Dizziness   Oxycodone     Other reaction(s): Delirium   Review of Systems  Unable to perform ROS   Physical Exam Constitutional:      Comments: Eyes open for few moments and patient went back to sleep.  Pulmonary:     Effort: Pulmonary effort is normal.     Vital Signs: BP (!) 147/75 (BP Location: Right Arm)   Pulse 89   Temp 100.3 F (37.9 C) (Axillary)   Resp 14   Ht 5\' 3"  (1.6 m)   Wt 57.6 kg   SpO2 94%   BMI 22.50 kg/m  Pain Scale: PAINAD   Pain Score: Asleep   SpO2: SpO2: 94 % O2 Device:SpO2: 94 % O2 Flow Rate: .O2 Flow Rate (L/min): 3 L/min  IO: Intake/output summary:  Intake/Output Summary (Last 24 hours) at 12/24/2021 1340 Last data filed at 12/24/2021 1038 Gross per 24 hour  Intake 120 ml  Output 0 ml  Net 120 ml    LBM: Last BM Date : 12/24/21 Baseline Weight: Weight: 57.6 kg Most recent weight: Weight: 57.6 kg       Signed by: Asencion Gowda, NP   Please contact Palliative Medicine Team phone at [phone] for questions and concerns.  For individual provider: See Shea Evans

## 2021-12-24 NOTE — TOC Progression Note (Signed)
Transition of Care [facility]) - Progression Note    Patient Details  Name: Karas Pickerill MRN: [id number] Date of Birth: [birth year]  [facility]) CM/SW Contact  [name], RN Phone Number: 12/24/2021, 2:21 PM  Clinical Narrative:      Received call from [place] at [place].  She states initially she told MD they would not be able to accept the patient for a medical transport.  She states since I have already the clinical packet she will send it to the MD to review    Email sent to [email or web address] to confirm coverage benefits.  Per PT Patient needs LTC placement.   Sonia Baller with DSS updated at bedside.   Per Sonia Baller the following people can visit patient with out supervision - Gery Pray, Rayvon Char and Hosie Spangle - children  Can visit with supervision from [place] - daughter  No other visitors allowed, all patient updates must be given to DSS ONLY  Expected Discharge Plan and Services                                                 Social Determinants of Health (SDOH) Interventions    Readmission Risk Interventions     No data to display

## 2021-12-24 NOTE — Discharge Summary (Signed)
Physician Discharge Summary   Patient: Timothy Noble MRN: [id number] DOB: October 06, 1930  Admit date:     12/21/2021  Discharge date: 12/24/21  Discharge Physician: Sharen Hones   PCP: Olin Hauser, DO   Recommendations at discharge:   Transfer to [facility].  Discharge Diagnoses: Principal Problem:   Severe sepsis ([place]) secondary to colitis Active Problems:   Dementia without behavioral disturbance ([place])   Decubitus ulcer of sacral region, stage 2 (HCC)   Anxiety and depression   Peripheral neuropathy   Hyperglycemia   GERD without esophagitis   Hyponatremia   Pancreatic lesion  Resolved Problems:   AKI (acute kidney injury) [facility])  Hospital Course: 88 year old male with past medical history of dementia presented from nursing facility with nausea, vomiting and diarrhea that started earlier that afternoon plus fever of 101.  Patient admitted for severe sepsis secondary to colitis.   Patient is treated with cefepime and Flagyl.  Condition appears to be improving, but still has a fever. Called the [facility], will transfer to [facility] for further treatment.  Assessment and Plan: * Severe sepsis ([place]) secondary to colitis Proctitis and colitis in the descending colon. Acute kidney injury secondary to sepsis. Hyponatremia.  Met criteria on admission given fever, tachycardia, leukocytosis, colitis source and lactic acidosis.   Patient has received IV fluids, will continue antibiotics with cefepime and Flagyl. Blood cultures are negative.   Dementia without behavioral disturbance ([place]) Dysphagia with aspiration risk. Mental status back to baseline. Patient has been evaluated by speech therapy, currently on dysphagia 1 diet.   Decubitus ulcer of sacral region, stage 2 ([place]) Present on admission.  Continue to follow.   Anxiety and depression Continue home medicines.   Peripheral neuropathy Continue Neurontin and Cymbalta.   Hyperglycemia A1c of 4.7.   GERD  without esophagitis PPI.   Pancreatic lesion Incidental lesion noted in tail of pancreas.  Unrelated to colitis.  Check pancreatic MRI once colitis better treated.  Checking MRI would not give diagnostic confirmation, but would require a biopsy.  Given patient's multiple medical issues including dementia as well as his advanced age, there is no benefit.  Called to legal guardian, could not leave message, phone is full.   Pressure ulcers: POA Pressure Injury 12/22/21 Sacrum Mid Stage 2 -  Partial thickness loss of dermis presenting as a shallow open injury with a red, pink wound bed without slough. area in center of stage 1 PI on sacrum is stage 2 (Active)  12/22/21 1103  Location: Sacrum  Location Orientation: Mid  Staging: Stage 2 -  Partial thickness loss of dermis presenting as a shallow open injury with a red, pink wound bed without slough.  Wound Description (Comments): area in center of stage 1 PI on sacrum is stage 2  Present on Admission: Yes           Consultants: None Procedures performed: None  [facility]  Discharge Diet Orders (From admission, onward)     Start     Ordered   12/24/21 0000  Diet general       Comments: Dys 1 diet   12/24/21 1515           Dysphagia type 1 thin Liquid DISCHARGE MEDICATION: Allergies as of 12/24/2021       Reactions   Alendronate Sodium    Terazosin    Other reaction(s): Dizziness   Oxycodone    Other reaction(s): Delirium  Medication List     STOP taking these medications    sertraline 20 MG/ML concentrated solution Commonly known as: ZOLOFT Replaced by: sertraline 100 MG tablet   tamsulosin 0.4 MG Caps capsule Commonly known as: FLOMAX   tiZANidine 4 MG tablet Commonly known as: Zanaflex   VITAMIN D (CHOLECALCIFEROL) PO       TAKE these medications    acetaminophen 325 MG tablet Commonly known as: TYLENOL TAKE THREE TABLETS BY MOUTH EVERY 8 HOURS AS NEEDED  FOR PAIN OR FEVER EACH TABLET CONTAINS 325MG ACETAMINOPHEN (TYLENOL,APAP). MAXIMUM DAILY RECOMMENDED DOSE IS 3000MG   albuterol (2.5 MG/3ML) 0.083% nebulizer solution Commonly known as: PROVENTIL Take 2.5 mg by nebulization every 6 (six) hours as needed for wheezing or shortness of breath.   ceFEPIme 2 g in sodium chloride 0.9 % 100 mL Inject 2 g into the vein every 12 (twelve) hours.   diclofenac Sodium 1 % Gel Commonly known as: VOLTAREN APPLY 2 GRAMS TOPICALLY FOUR TIMES A DAY AS DIRECTED   divalproex 125 MG capsule Commonly known as: DEPAKOTE SPRINKLE Take 250 mg by mouth 2 (two) times daily.   gabapentin 400 MG capsule Commonly known as: NEURONTIN Take 400 mg by mouth 3 (three) times daily.   haloperidol 2 MG/ML solution Commonly known as: HALDOL Take 2 mg by mouth 2 (two) times daily.   lidocaine 5 % Commonly known as: LIDODERM APPLY 1 PATCH TO SKIN EVERY DAY APPLY ONCE DAILY FOR UP TO 12 HOURS (12 HOURS ON THEN REMOVE FOR 12 HOURS)   melatonin 5 MG Tabs Take 1 tablet (5 mg total) by mouth at bedtime.   metroNIDAZOLE 500 MG/100ML Commonly known as: FLAGYL Inject 100 mLs (500 mg total) into the vein every 12 (twelve) hours.   pantoprazole 40 MG tablet Commonly known as: Protonix Take 1 tablet (40 mg total) by mouth daily.   psyllium 0.52 g capsule Commonly known as: REGULOID Take 0.52 g by mouth in the morning and at bedtime.   senna-docusate 8.6-50 MG tablet Commonly known as: Senokot-S Take 1 tablet by mouth daily as needed for mild constipation.   sertraline 100 MG tablet Commonly known as: ZOLOFT Take 1 tablet (100 mg total) by mouth at bedtime. Replaces: sertraline 20 MG/ML concentrated solution   Spiriva Respimat 1.25 MCG/ACT Aers Generic drug: Tiotropium Bromide Monohydrate Inhale 2 each into the lungs in the morning.   traMADol 50 MG tablet Commonly known as: ULTRAM Take 50 mg by mouth 2 (two) times daily.               Discharge Care  Instructions  (From admission, onward)           Start     Ordered   12/24/21 0000  Discharge wound care:       Comments: Follow with [place] hospital attending   12/24/21 1515            Discharge Exam: Filed Weights   12/21/21 2236  Weight: 57.6 kg   General exam: Appears calm and comfortable  Respiratory system: Clear to auscultation. Respiratory effort normal. Cardiovascular system: S1 & S2 heard, RRR. No JVD, murmurs, rubs, gallops or clicks. No pedal edema. Gastrointestinal system: Abdomen is nondistended, soft and nontender. No organomegaly or masses felt. Normal bowel sounds heard. Central nervous system: Alert and oriented x1. No focal neurological deficits. Extremities: Symmetric 5 x 5 power. Skin: No rashes, lesions or ulcers Psychiatry:  Mood & affect appropriate.    Condition at discharge:  fair  The results of significant diagnostics from this hospitalization (including imaging, microbiology, ancillary and laboratory) are listed below for reference.   Imaging Studies: DG Chest Port 1 View  Result Date: 12/23/2021 CLINICAL DATA:  Fever. EXAM: PORTABLE CHEST 1 VIEW COMPARISON:  Chest x-ray 12/21/2021 FINDINGS: Lung volumes are low likely accentuating central pulmonary vascularity. There is no focal lung infiltrate, pleural effusion or pneumothorax. The cardiomediastinal silhouette is within normal limits. No acute fractures are seen. There surgical clips in the right abdomen. There are multiple healed left-sided rib fractures. IMPRESSION: No active disease. Electronically Signed   By: Ronney Asters M.D.   On: 12/23/2021 23:38   CT ABDOMEN PELVIS W CONTRAST  Result Date: 12/21/2021 CLINICAL DATA:  Nausea and vomiting. EXAM: CT ABDOMEN AND PELVIS WITH CONTRAST TECHNIQUE: Multidetector CT imaging of the abdomen and pelvis was performed using the standard protocol following bolus administration of intravenous contrast. RADIATION DOSE REDUCTION: This exam was performed  according to the departmental dose-optimization program which includes automated exposure control, adjustment of the mA and/or kV according to patient size and/or use of iterative reconstruction technique. CONTRAST:  32m OMNIPAQUE IOHEXOL 300 MG/ML  SOLN COMPARISON:  CT abdomen and pelvis 06/16/2020 FINDINGS: Lower chest: There is atelectasis in the right lung base. Hepatobiliary: Gallbladder surgically absent. Mild prominence of the biliary system is similar to prior. No focal liver lesions are seen. Pancreas: No pancreatic ductal dilatation or surrounding inflammatory changes. Rounded density with punctate calcification seen in the tail the pancreas measuring 16 mm image 2/30. This is unchanged. Spleen: Normal in size without focal abnormality. Calcified granuloma is present. Adrenals/Urinary Tract: The kidneys and adrenal glands are within normal limits. There is mild diffuse bladder wall thickening, unchanged. Stomach/Bowel: There is wall thickening and surrounding inflammatory stranding of the descending colon to the level the rectum. No bowel obstruction, pneumatosis or free air. The appendix is not seen. Stomach and small bowel are within normal limits. Vascular/Lymphatic: Aortic atherosclerosis. No enlarged abdominal or pelvic lymph nodes. Reproductive: Prostate gland is enlarged, unchanged. Other: No abdominal wall hernia or abnormality. No abdominopelvic ascites. Musculoskeletal: Degenerative changes affect the spine and hips. IMPRESSION: 1. Wall thickening and inflammation of the descending colon to the level of the rectum compatible with proctocolitis. 2. Stable prostatomegaly with chronic bladder outlet obstruction. 3. Rounded lesion in the pancreatic tail measures 16 mm, indeterminate. Recommend further evaluation with MRI of the pancreas. 4.  Aortic Atherosclerosis (ICD10-I70.0). Electronically Signed   By: ARonney AstersM.D.   On: 12/21/2021 21:28   DG Chest Port 1 View  Result Date:  12/21/2021 CLINICAL DATA:  Fever EXAM: PORTABLE CHEST 1 VIEW COMPARISON:  07/10/2019 FINDINGS: Lung volumes are small and pulmonary insufflation has diminished since prior examination. Minimal left basilar atelectasis or scarring is present. Probable chronic underlying interstitial changes. No pneumothorax or pleural effusion. Cardiac size within normal limits. Pulmonary vascularity is normal. No acute bone abnormality. Multiple remote left rib fractures are noted. IMPRESSION: Pulmonary hypoinflation. Electronically Signed   By: AFidela SalisburyM.D.   On: 12/21/2021 21:02    Microbiology: Results for orders placed or performed during the hospital encounter of 12/21/21  Culture, blood (routine x 2)     Status: None (Preliminary result)   Collection Time: 12/21/21  8:29 PM   Specimen: BLOOD  Result Value Ref Range Status   Specimen Description BLOOD RIGHT ANTECUBITAL  Final   Special Requests   Final    BOTTLES DRAWN AEROBIC AND ANAEROBIC Blood Culture  results may not be optimal due to an inadequate volume of blood received in culture bottles   Culture   Final    NO GROWTH 3 DAYS Performed at [facility], [address] [place], [place] [address]    Report Status PENDING  Incomplete  Culture, blood (routine x 2)     Status: None (Preliminary result)   Collection Time: 12/21/21  8:34 PM   Specimen: BLOOD  Result Value Ref Range Status   Specimen Description BLOOD LEFT ANTECUBITAL  Final   Special Requests   Final    BOTTLES DRAWN AEROBIC AND ANAEROBIC Blood Culture results may not be optimal due to an inadequate volume of blood received in culture bottles   Culture   Final    NO GROWTH 3 DAYS Performed at [facility], [address], [place], [place] [address]    Report Status PENDING  Incomplete  Resp Panel by RT-PCR (Flu A&B, Covid) Anterior Nasal Swab     Status: None   Collection Time: 12/21/21  8:40 PM   Specimen: Anterior Nasal Swab  Result Value Ref Range  Status   SARS Coronavirus 2 by RT PCR NEGATIVE NEGATIVE Final    Comment: (NOTE) SARS-CoV-2 target nucleic acids are NOT DETECTED.  The SARS-CoV-2 RNA is generally detectable in upper respiratory specimens during the acute phase of infection. The lowest concentration of SARS-CoV-2 viral copies this assay can detect is 138 copies/mL. A negative result does not preclude SARS-Cov-2 infection and should not be used as the sole basis for treatment or other patient management decisions. A negative result may occur with  improper specimen collection/handling, submission of specimen other than nasopharyngeal swab, presence of viral mutation(s) within the areas targeted by this assay, and inadequate number of viral copies(<138 copies/mL). A negative result must be combined with clinical observations, patient history, and epidemiological information. The expected result is Negative.  Fact Sheet for Patients:  EntrepreneurPulse.com.au  Fact Sheet for Healthcare Providers:  IncredibleEmployment.be  This test is no t yet approved or cleared by the [place] FDA and  has been authorized for detection and/or diagnosis of SARS-CoV-2 by FDA under an Emergency Use Authorization (EUA). This EUA will remain  in effect (meaning this test can be used) for the duration of the COVID-19 declaration under Section 564(b)(1) of the Act, 21 U.S.C.section 360bbb-3(b)(1), unless the authorization is terminated  or revoked sooner.       Influenza A by PCR NEGATIVE NEGATIVE Final   Influenza B by PCR NEGATIVE NEGATIVE Final    Comment: (NOTE) The Xpert Xpress SARS-CoV-2/FLU/RSV plus assay is intended as an aid in the diagnosis of influenza from Nasopharyngeal swab specimens and should not be used as a sole basis for treatment. Nasal washings and aspirates are unacceptable for Xpert Xpress SARS-CoV-2/FLU/RSV testing.  Fact Sheet for  Patients: EntrepreneurPulse.com.au  Fact Sheet for Healthcare Providers: IncredibleEmployment.be  This test is not yet approved or cleared by the [place] FDA and has been authorized for detection and/or diagnosis of SARS-CoV-2 by FDA under an Emergency Use Authorization (EUA). This EUA will remain in effect (meaning this test can be used) for the duration of the COVID-19 declaration under Section 564(b)(1) of the Act, 21 U.S.C. section 360bbb-3(b)(1), unless the authorization is terminated or revoked.  Performed at [facility], [address], [place], [place] [address]   Gastrointestinal Panel by PCR , Stool     Status: None   Collection Time: 12/21/21  9:46 PM  Specimen: Stool  Result Value Ref Range Status   Campylobacter species NOT DETECTED NOT DETECTED Final   Plesimonas shigelloides NOT DETECTED NOT DETECTED Final   Salmonella species NOT DETECTED NOT DETECTED Final   Yersinia enterocolitica NOT DETECTED NOT DETECTED Final   Vibrio species NOT DETECTED NOT DETECTED Final   Vibrio cholerae NOT DETECTED NOT DETECTED Final   Enteroaggregative E coli (EAEC) NOT DETECTED NOT DETECTED Final   Enteropathogenic E coli (EPEC) NOT DETECTED NOT DETECTED Final   Enterotoxigenic E coli (ETEC) NOT DETECTED NOT DETECTED Final   Shiga like toxin producing E coli (STEC) NOT DETECTED NOT DETECTED Final   Shigella/Enteroinvasive E coli (EIEC) NOT DETECTED NOT DETECTED Final   Cryptosporidium NOT DETECTED NOT DETECTED Final   Cyclospora cayetanensis NOT DETECTED NOT DETECTED Final   Entamoeba histolytica NOT DETECTED NOT DETECTED Final   Giardia lamblia NOT DETECTED NOT DETECTED Final   Adenovirus F40/41 NOT DETECTED NOT DETECTED Final   Astrovirus NOT DETECTED NOT DETECTED Final   Norovirus GI/GII NOT DETECTED NOT DETECTED Final   Rotavirus A NOT DETECTED NOT DETECTED Final   Sapovirus (I, II, IV, and V) NOT DETECTED NOT DETECTED Final     Comment: Performed at [facility], [address] [place], [place] [address]  C Difficile Quick Screen w PCR reflex     Status: None   Collection Time: 12/21/21  9:46 PM   Specimen: Stool  Result Value Ref Range Status   C Diff antigen NEGATIVE NEGATIVE Final   C Diff toxin NEGATIVE NEGATIVE Final   C Diff interpretation No C. difficile detected.  Final    Comment: Performed at [facility], [address] [place], [place] [address]    Labs: CBC: Recent Labs  Lab 12/21/21 2026 12/22/21 0527 12/23/21 0609 12/24/21 0729  WBC 19.9* 25.9* 18.0* 19.2*  HGB 13.1 11.8* 10.3* 10.9*  HCT 41.2 37.7* 31.3* 33.6*  MCV 96.3 97.7 94.8 93.9  PLT 247 211 168 333   Basic Metabolic Panel: Recent Labs  Lab 12/21/21 2026 12/22/21 0527 12/23/21 0609 12/24/21 0729  NA 133* 136 135 133*  K 5.0 4.1 4.0 3.6  CL 95* 108 105 104  CO2 26 21* 25 24  GLUCOSE 144* 125* 99 129*  BUN 33* 27* 27* 28*  CREATININE 1.52* 0.96 0.97 0.93  CALCIUM 9.6 7.4* 8.7* 8.7*   Liver Function Tests: Recent Labs  Lab 12/21/21 2026  AST 27  ALT 13  ALKPHOS 58  BILITOT 1.0  PROT 7.2  ALBUMIN 3.8   CBG: Recent Labs  Lab 12/22/21 1333 12/22/21 1714  GLUCAP 120* 127*    Discharge time spent: greater than 30 minutes.  Signed: Sharen Hones, MD [facility] 12/24/2021

## 2021-12-24 NOTE — Evaluation (Signed)
Occupational Therapy Evaluation Patient Details Name: Timothy Noble MRN: [id number] DOB: 04-11-1930 Today's Date: 12/24/2021   History of Present Illness Pt is an 88 y/o M admitted on 12/21/21 after presenting with c/o nausea, vomiting, diarrhea & fever. Pt is being treated for sepsis 2/2 colitis. PMH: dementia, lung CA, GERD   Clinical Impression   Timothy Noble was seen for OT/PT co-evaluation this date. Prior to hospital admission, pt was TOTAL assist at [facility] memory care unit - requires lift for transfers however assists with rolling by holding onto bed rail. Pt presents to acute OT demonstrating significant lethargy and impaired strength. Pt currently requires TOTAL A x2 for toileting at bed level. Hand placed on bed rail however does not assist. Attempted to drink, pt currently too lethargic. Pt would benefit from skilled OT on trial basis for 3 sessions. Upon hospital discharge, recommend LTC to maximize pt safety and return to PLOF.    Recommendations for follow up therapy are one component of a multi-disciplinary discharge planning process, led by the attending physician.  Recommendations may be updated based on patient status, additional functional criteria and insurance authorization.   Follow Up Recommendations  Long-term institutional care without follow-up therapy    Assistance Recommended at Discharge Frequent or constant Supervision/Assistance  Patient can return home with the following Two people to help with walking and/or transfers;Two people to help with bathing/dressing/bathroom    Functional Status Assessment  Patient has had a recent decline in their functional status and demonstrates the ability to make significant improvements in function in a reasonable and predictable amount of time.  Equipment Recommendations  Hospital bed    Recommendations for Other Services       Precautions / Restrictions Precautions Precautions: Fall Restrictions Weight Bearing  Restrictions: No      Mobility Bed Mobility Overal bed mobility: Needs Assistance Bed Mobility: Rolling Rolling: Total assist, +2 for physical assistance              Transfers                   General transfer comment: unable to tolerate          ADL either performed or assessed with clinical judgement   ADL Overall ADL's : Needs assistance/impaired                                       General ADL Comments: TOTAL A x2 for toileting at bed level. Attempted to drink however too lethargic      Pertinent Vitals/Pain Pain Assessment Pain Assessment: Faces Faces Pain Scale: Hurts a little bit Pain Location: pt verbalizes "ow" when PT removed telemetry patch off RUE Pain Descriptors / Indicators: Aching, Dull Pain Intervention(s): Limited activity within patient's tolerance     Hand Dominance     Extremity/Trunk Assessment Upper Extremity Assessment Upper Extremity Assessment: Generalized weakness   Lower Extremity Assessment Lower Extremity Assessment: Generalized weakness   Cervical / Trunk Assessment Cervical / Trunk Assessment: Kyphotic   Communication Communication Communication: Other (comment) (verbalizes "ow" one time, otherwise does not speak)   Cognition Arousal/Alertness: Lethargic   Overall Cognitive Status: History of cognitive impairments - at baseline                                 General Comments: Pt very lethargic,  not responding to peri care. Does report "ow" towards end of session when PT removes RUE telemetry pad, beginning to open eyes towards end of session.                Home Living Family/patient expects to be discharged to:: Assisted living                                        Prior Functioning/Environment Prior Level of Function : Needs assist             Mobility Comments: Per caregiver in room Helene Kelp), pt required assistance for all mobility tasks (bed  mobility, sitting EOB, & used STS lift to transfer to chair). ADLs Comments: Requires assistance for feeding, bathing, dressing at baseline.        OT Problem List: Decreased strength;Decreased activity tolerance      OT Treatment/Interventions: Self-care/ADL training;Therapeutic exercise;Energy conservation;DME and/or AE instruction;Therapeutic activities;Patient/family education;Balance training    OT Goals(Current goals can be found in the care plan section) Acute Rehab OT Goals Patient Stated Goal: to improve alertness OT Goal Formulation: With family Time For Goal Achievement: 01/07/22 Potential to Achieve Goals: Poor  OT Frequency: Min 1X/week    Co-evaluation PT/OT/SLP Co-Evaluation/Treatment: Yes Reason for Co-Treatment: Necessary to address cognition/behavior during functional activity;For patient/therapist safety;To address functional/ADL transfers PT goals addressed during session: Mobility/safety with mobility OT goals addressed during session: ADL's and self-care      AM-PAC OT "6 Clicks" Daily Activity     Outcome Measure Help from another person eating meals?: Total Help from another person taking care of personal grooming?: Total Help from another person toileting, which includes using toliet, bedpan, or urinal?: Total Help from another person bathing (including washing, rinsing, drying)?: Total Help from another person to put on and taking off regular upper body clothing?: Total Help from another person to put on and taking off regular lower body clothing?: Total 6 Click Score: 6   End of Session Nurse Communication: Mobility status  Activity Tolerance: Patient limited by lethargy Patient left: in bed;with call bell/phone within reach;with nursing/sitter in room;with family/visitor present  OT Visit Diagnosis: Muscle weakness (generalized) (M62.81)                Time: 5409-8119 OT Time Calculation (min): 15 min Charges:  OT General Charges $OT Visit: 1  Visit OT Evaluation $OT Eval Low Complexity: 1 Low  Dessie Coma, M.S. OTR/L  12/24/21, 12:54 PM  ascom [phone]'

## 2021-12-24 NOTE — Progress Notes (Addendum)
SLP Cancellation Note  Patient Details Name: Timothy Noble MRN: [id number] DOB: 10/05/1930   Cancelled treatment:       Reason Eval/Treat Not Completed: Patient at procedure or test/unavailable (chart reviewed; pt and caregiver/family meeting w/ Palliative Care in the room.) Per NSG, pt is tolerating bites/sips following aspiration precautions. Though, she feels pt is often lethargic. Education provided on aspiration precs and need to be FULLY AWAKE for any po's. Meds are being crushed in puree.  ST services can f/u for further education, assessment as needed while admitted. NSG agreed.   ADDENDUM: Met w/ his personal caregiver for a few minutes while DSS was meeting w/ Palliative Care to discuss his eating/drinking, aspiration precautions. She described using precautions including pinched straw for Cognitive alerting to drinking(baseline Dementia). She denied any consistent overt s/s of aspiration; she endorsed coughing 1x when eating foods this morning. Caregiver stated pt was more drowsy today. Highlighted NOT feeding pt unless FULLY awake/alert for po tasks w/ caregiver and w/ NSG.    Orinda Kenner, MS, CCC-SLP Speech Language Pathologist Rehab Services; [place] [phone] (ascom) Nik Gorrell 12/24/2021, 1:06 PM

## 2021-12-24 NOTE — TOC Progression Note (Signed)
Transition of Care [facility]) - Progression Note    Patient Details  Name: Timothy Noble MRN: [id number] Date of Birth: 1931/02/24  Transition of Care [facility]) CM/SW Contact  [name], RN Phone Number: 12/24/2021, 4:31 PM  Clinical Narrative:     Covid results faxed to [facility]        Expected Discharge Plan and Services           Expected Discharge Date: 12/24/21                                     Social Determinants of Health (SDOH) Interventions    Readmission Risk Interventions     No data to display

## 2021-12-24 NOTE — TOC Progression Note (Signed)
Transition of Care [facility]) - Progression Note    Patient Details  Name: Timothy Noble MRN: [id number] Date of Birth: [birth year]  Transition of Care [facility]) CM/SW Contact  [name], RN Phone Number: 12/24/2021, 3:59 PM  Clinical Narrative:    Per MD patient has been accepted by [facility] for transfer [facility] called transfer center at [phone] extension [phone] and spoke with Mechele Claude She states that covid results will have to be submitted and then they can assign a bed  Jessi bedside RN or Meagan with TOC to fax covid results to [phone]  If bed is assigned tonight [facility] to call [phone] and give the charge nurse bed assignment If Bed is assigned tomorrow [facility] to call Chrystal with Brigitte Pulse with DSS updated        Expected Discharge Plan and Services           Expected Discharge Date: 12/24/21                                     Social Determinants of Health (SDOH) Interventions    Readmission Risk Interventions     No data to display

## 2021-12-24 NOTE — Progress Notes (Signed)
  Progress Note   Patient: Timothy Noble HCC:[id number] DOB: 11/13/1930 DOA: 12/21/2021     3 DOS: the patient was seen and examined on 12/24/2021   Brief hospital course: 88 year old male with past medical history of dementia presented from nursing facility with nausea, vomiting and diarrhea that started earlier that afternoon plus fever of 101.  Patient admitted for severe sepsis secondary to colitis.   Patient is treated with cefepime and Flagyl.  Condition appears to be improving. [facility], they do not accept patient as patient condition has been improving.  He can be sent to [facility] when ready to discharge.  Assessment and Plan: * Severe sepsis ([place]) secondary to colitis Proctitis and colitis in the descending colon. Acute kidney injury secondary to sepsis. Hyponatremia.  Met criteria on admission given fever, tachycardia, leukocytosis, colitis source and lactic acidosis.   Patient has received IV fluids, will continue antibiotics with cefepime and Flagyl. Blood cultures are negative.  Dementia without behavioral disturbance ([place]) Dysphagia with aspiration risk. Mental status back to baseline.  Decubitus ulcer of sacral region, stage 2 ([place]) Present on admission.  Continue to follow.  Anxiety and depression Continue home medicines.  Peripheral neuropathy Continue Neurontin and Cymbalta.  Hyperglycemia A1c of 4.7.  GERD without esophagitis PPI.  Pancreatic lesion Incidental lesion noted in tail of pancreas.  Unrelated to colitis.  Check pancreatic MRI once colitis better treated.  Checking MRI would not give diagnostic confirmation, but would require a biopsy.  Given patient's multiple medical issues including dementia as well as his advanced age, there is no benefit.  Called to legal guardian, could not leave message, phone is full.       Subjective:  Patient is confused as at baseline.  Otherwise no complaints.  Physical Exam: Vitals:   12/23/21  2308 12/24/21 0130 12/24/21 0349 12/24/21 0734  BP:   (!) 144/97 (!) 147/75  Pulse:   88 89  Resp:   20 14  Temp: (!) 101.6 F (38.7 C) 99.3 F (37.4 C) 99 F (37.2 C) 100.3 F (37.9 C)  TempSrc: Axillary Axillary Axillary Axillary  SpO2:   95% 94%  Weight:      Height:       General exam: Appears calm and comfortable  Respiratory system: Clear to auscultation. Respiratory effort normal. Cardiovascular system: S1 & S2 heard, RRR. No JVD, murmurs, rubs, gallops or clicks. No pedal edema. Gastrointestinal system: Abdomen is nondistended, soft and nontender. No organomegaly or masses felt. Normal bowel sounds heard. Central nervous system: Alert and oriented x1. No focal neurological deficits. Extremities: Symmetric 5 x 5 power. Skin: No rashes, lesions or ulcers Psychiatry: Mood & affect appropriate.   Data Reviewed:  Reviewed CT scan, lab results.  Family Communication: Not able to reach legal guardian, not able to leave a message.  Disposition: Status is: Inpatient Remains inpatient appropriate because: Severity of disease, IV treatment.  Planned Discharge Destination: Skilled nursing facility    Time spent: 55 minutes  Author: Sharen Hones, MD 12/24/2021 1:06 PM  For on call review [email or web address].

## 2021-12-25 DIAGNOSIS — F0390 Unspecified dementia without behavioral disturbance: Secondary | ICD-10-CM | POA: Diagnosis not present

## 2021-12-25 DIAGNOSIS — R6520 Severe sepsis without septic shock: Secondary | ICD-10-CM | POA: Diagnosis not present

## 2021-12-25 DIAGNOSIS — N179 Acute kidney failure, unspecified: Secondary | ICD-10-CM | POA: Diagnosis not present

## 2021-12-25 DIAGNOSIS — K529 Noninfective gastroenteritis and colitis, unspecified: Secondary | ICD-10-CM

## 2021-12-25 DIAGNOSIS — A419 Sepsis, unspecified organism: Secondary | ICD-10-CM | POA: Diagnosis not present

## 2021-12-25 NOTE — Progress Notes (Signed)
Report called to [facility]. Report given to Faith RN receiving pt Legal guardian made aware through DSS on call. Successfully notified. Daughter at bedside. [facility] called for transport.

## 2021-12-25 NOTE — Discharge Summary (Signed)
Physician Discharge Summary   Patient: Timothy Noble MRN: [id number] DOB: 1930-04-03  Admit date:     12/21/2021  Discharge date: 12/25/21  Discharge Physician: Sharen Hones   PCP: Olin Hauser, DO   Recommendations at discharge:   Transfer to [facility]. Patient still has fever today, consider add vancomycin to the regimen.  If continue have high fever, may need to rescan patient abdomen/pelvis to make sure patient does not develop abscess.  Discharge Diagnoses: Principal Problem:   Severe sepsis ([place]) secondary to colitis Active Problems:   Dementia without behavioral disturbance (H[facility])   Decubitus ulcer of sacral region, stage 2 (HCC)   Anxiety and depression   Peripheral neuropathy   Hyperglycemia   GERD without esophagitis   Hyponatremia   Pancreatic lesion  Resolved Problems:   AKI (acute kidney injury) [facility])  Hospital Course: 88 year old male with past medical history of dementia presented from nursing facility with nausea, vomiting and diarrhea that started earlier that afternoon plus fever of 101.  Patient admitted for severe sepsis secondary to colitis.   Patient is treated with cefepime and Flagyl.  Condition appears to be improving, but still has a fever. Called the [facility], will transfer to [facility] for further treatment.  Assessment and Plan:  * Severe sepsis ([place]) secondary to colitis Proctitis and colitis in the descending colon. Acute kidney injury secondary to sepsis. Hyponatremia.  Met criteria on admission given fever, tachycardia, leukocytosis, colitis source and lactic acidosis.   Patient has received IV fluids, will continue antibiotics with cefepime and Flagyl. Blood cultures are negative. Patient still has a daily fever.  Consider add vancomycin, may also consider rescan to make sure patient does not develop abscess.  Patient be transferred to [facility] today.   Dementia without behavioral disturbance ([place]) Dysphagia with  aspiration risk. Mental status back to baseline. Patient has been evaluated by speech therapy, currently on dysphagia 1 diet.   Decubitus ulcer of sacral region, stage 2 ([place]) Present on admission.  Continue to follow.   Anxiety and depression Continue home medicines.   Peripheral neuropathy Continue Neurontin and Cymbalta.   Hyperglycemia A1c of 4.7.   GERD without esophagitis PPI.   Pancreatic lesion Incidental lesion noted in tail of pancreas.  Unrelated to colitis.  Check pancreatic MRI once colitis better treated.  Checking MRI would not give diagnostic confirmation, but would require a biopsy.  Given patient's multiple medical issues including dementia as well as his advanced age, there is no benefit.  Called to legal guardian, could not leave message, phone is full.    Pressure ulcers: POA Pressure Injury 12/22/21 Sacrum Mid Stage 2 -  Partial thickness loss of dermis presenting as a shallow open injury with a red, pink wound bed without slough. area in center of stage 1 PI on sacrum is stage 2 (Active)  12/22/21 1103  Location: Sacrum  Location Orientation: Mid  Staging: Stage 2 -  Partial thickness loss of dermis presenting as a shallow open injury with a red, pink wound bed without slough.  Wound Description (Comments): area in center of stage 1 PI on sacrum is stage 2  Present on Admission: Yes        Consultants: None Procedures performed: None  [facility]  Discharge Diet Orders (From admission, onward)     Start     Ordered   12/24/21 0000  Diet general       Comments: Dys 1 diet  12/24/21 1515           Cardiac diet DISCHARGE MEDICATION: Allergies as of 12/25/2021       Reactions   Alendronate Sodium    Terazosin    Other reaction(s): Dizziness   Oxycodone    Other reaction(s): Delirium        Medication List     STOP taking these medications    sertraline 20 MG/ML concentrated solution Commonly  known as: ZOLOFT Replaced by: sertraline 100 MG tablet   tamsulosin 0.4 MG Caps capsule Commonly known as: FLOMAX   tiZANidine 4 MG tablet Commonly known as: Zanaflex   VITAMIN D (CHOLECALCIFEROL) PO       TAKE these medications    acetaminophen 325 MG tablet Commonly known as: TYLENOL TAKE THREE TABLETS BY MOUTH EVERY 8 HOURS AS NEEDED FOR PAIN OR FEVER EACH TABLET CONTAINS 325MG ACETAMINOPHEN (TYLENOL,APAP). MAXIMUM DAILY RECOMMENDED DOSE IS 3000MG   albuterol (2.5 MG/3ML) 0.083% nebulizer solution Commonly known as: PROVENTIL Take 2.5 mg by nebulization every 6 (six) hours as needed for wheezing or shortness of breath.   ceFEPIme 2 g in sodium chloride 0.9 % 100 mL Inject 2 g into the vein every 12 (twelve) hours.   diclofenac Sodium 1 % Gel Commonly known as: VOLTAREN APPLY 2 GRAMS TOPICALLY FOUR TIMES A DAY AS DIRECTED   divalproex 125 MG capsule Commonly known as: DEPAKOTE SPRINKLE Take 250 mg by mouth 2 (two) times daily.   gabapentin 400 MG capsule Commonly known as: NEURONTIN Take 400 mg by mouth 3 (three) times daily.   haloperidol 2 MG/ML solution Commonly known as: HALDOL Take 2 mg by mouth 2 (two) times daily.   lidocaine 5 % Commonly known as: LIDODERM APPLY 1 PATCH TO SKIN EVERY DAY APPLY ONCE DAILY FOR UP TO 12 HOURS (12 HOURS ON THEN REMOVE FOR 12 HOURS)   melatonin 5 MG Tabs Take 1 tablet (5 mg total) by mouth at bedtime.   metroNIDAZOLE 500 MG/100ML Commonly known as: FLAGYL Inject 100 mLs (500 mg total) into the vein every 12 (twelve) hours.   pantoprazole 40 MG tablet Commonly known as: Protonix Take 1 tablet (40 mg total) by mouth daily.   psyllium 0.52 g capsule Commonly known as: REGULOID Take 0.52 g by mouth in the morning and at bedtime.   senna-docusate 8.6-50 MG tablet Commonly known as: Senokot-S Take 1 tablet by mouth daily as needed for mild constipation.   sertraline 100 MG tablet Commonly known as: ZOLOFT Take 1  tablet (100 mg total) by mouth at bedtime. Replaces: sertraline 20 MG/ML concentrated solution   Spiriva Respimat 1.25 MCG/ACT Aers Generic drug: Tiotropium Bromide Monohydrate Inhale 2 each into the lungs in the morning.   traMADol 50 MG tablet Commonly known as: ULTRAM Take 50 mg by mouth 2 (two) times daily.               Discharge Care Instructions  (From admission, onward)           Start     Ordered   12/24/21 0000  Discharge wound care:       Comments: Follow with [facility] hospital attending   12/24/21 1515            Discharge Exam: Filed Weights   12/21/21 2236  Weight: 57.6 kg   General exam: Appears calm and comfortable  Respiratory system: Clear to auscultation. Respiratory effort normal. Cardiovascular system: S1 & S2 heard, RRR. No JVD, murmurs, rubs, gallops  or clicks. No pedal edema. Gastrointestinal system: Abdomen is nondistended, soft and nontender. No organomegaly or masses felt. Normal bowel sounds heard. Central nervous system: Drowsy and oriented x1. No focal neurological deficits. Extremities: Symmetric 5 x 5 power. Skin: No rashes, lesions or ulcers    Condition at discharge: fair  The results of significant diagnostics from this hospitalization (including imaging, microbiology, ancillary and laboratory) are listed below for reference.   Imaging Studies: DG Chest Port 1 View  Result Date: 12/23/2021 CLINICAL DATA:  Fever. EXAM: PORTABLE CHEST 1 VIEW COMPARISON:  Chest x-ray 12/21/2021 FINDINGS: Lung volumes are low likely accentuating central pulmonary vascularity. There is no focal lung infiltrate, pleural effusion or pneumothorax. The cardiomediastinal silhouette is within normal limits. No acute fractures are seen. There surgical clips in the right abdomen. There are multiple healed left-sided rib fractures. IMPRESSION: No active disease. Electronically Signed   By: Ronney Asters M.D.   On: 12/23/2021 23:38   CT ABDOMEN PELVIS W  CONTRAST  Result Date: 12/21/2021 CLINICAL DATA:  Nausea and vomiting. EXAM: CT ABDOMEN AND PELVIS WITH CONTRAST TECHNIQUE: Multidetector CT imaging of the abdomen and pelvis was performed using the standard protocol following bolus administration of intravenous contrast. RADIATION DOSE REDUCTION: This exam was performed according to the departmental dose-optimization program which includes automated exposure control, adjustment of the mA and/or kV according to patient size and/or use of iterative reconstruction technique. CONTRAST:  16m OMNIPAQUE IOHEXOL 300 MG/ML  SOLN COMPARISON:  CT abdomen and pelvis 06/16/2020 FINDINGS: Lower chest: There is atelectasis in the right lung base. Hepatobiliary: Gallbladder surgically absent. Mild prominence of the biliary system is similar to prior. No focal liver lesions are seen. Pancreas: No pancreatic ductal dilatation or surrounding inflammatory changes. Rounded density with punctate calcification seen in the tail the pancreas measuring 16 mm image 2/30. This is unchanged. Spleen: Normal in size without focal abnormality. Calcified granuloma is present. Adrenals/Urinary Tract: The kidneys and adrenal glands are within normal limits. There is mild diffuse bladder wall thickening, unchanged. Stomach/Bowel: There is wall thickening and surrounding inflammatory stranding of the descending colon to the level the rectum. No bowel obstruction, pneumatosis or free air. The appendix is not seen. Stomach and small bowel are within normal limits. Vascular/Lymphatic: Aortic atherosclerosis. No enlarged abdominal or pelvic lymph nodes. Reproductive: Prostate gland is enlarged, unchanged. Other: No abdominal wall hernia or abnormality. No abdominopelvic ascites. Musculoskeletal: Degenerative changes affect the spine and hips. IMPRESSION: 1. Wall thickening and inflammation of the descending colon to the level of the rectum compatible with proctocolitis. 2. Stable prostatomegaly with  chronic bladder outlet obstruction. 3. Rounded lesion in the pancreatic tail measures 16 mm, indeterminate. Recommend further evaluation with MRI of the pancreas. 4.  Aortic Atherosclerosis (ICD10-I70.0). Electronically Signed   By: ARonney AstersM.D.   On: 12/21/2021 21:28   DG Chest Port 1 View  Result Date: 12/21/2021 CLINICAL DATA:  Fever EXAM: PORTABLE CHEST 1 VIEW COMPARISON:  07/10/2019 FINDINGS: Lung volumes are small and pulmonary insufflation has diminished since prior examination. Minimal left basilar atelectasis or scarring is present. Probable chronic underlying interstitial changes. No pneumothorax or pleural effusion. Cardiac size within normal limits. Pulmonary vascularity is normal. No acute bone abnormality. Multiple remote left rib fractures are noted. IMPRESSION: Pulmonary hypoinflation. Electronically Signed   By: AFidela SalisburyM.D.   On: 12/21/2021 21:02    Microbiology: Results for orders placed or performed during the hospital encounter of 12/21/21  Culture, blood (routine x 2)  Status: None (Preliminary result)   Collection Time: 12/21/21  8:29 PM   Specimen: BLOOD  Result Value Ref Range Status   Specimen Description BLOOD RIGHT ANTECUBITAL  Final   Special Requests   Final    BOTTLES DRAWN AEROBIC AND ANAEROBIC Blood Culture results may not be optimal due to an inadequate volume of blood received in culture bottles   Culture   Final    NO GROWTH 4 DAYS Performed at [facility], [address], [place], [place] [address]    Report Status PENDING  Incomplete  Culture, blood (routine x 2)     Status: None (Preliminary result)   Collection Time: 12/21/21  8:34 PM   Specimen: BLOOD  Result Value Ref Range Status   Specimen Description BLOOD LEFT ANTECUBITAL  Final   Special Requests   Final    BOTTLES DRAWN AEROBIC AND ANAEROBIC Blood Culture results may not be optimal due to an inadequate volume of blood received in culture bottles   Culture   Final     NO GROWTH 4 DAYS Performed at [facility], [address], [place], [place] [address]    Report Status PENDING  Incomplete  Resp Panel by RT-PCR (Flu A&B, Covid) Anterior Nasal Swab     Status: None   Collection Time: 12/21/21  8:40 PM   Specimen: Anterior Nasal Swab  Result Value Ref Range Status   SARS Coronavirus 2 by RT PCR NEGATIVE NEGATIVE Final    Comment: (NOTE) SARS-CoV-2 target nucleic acids are NOT DETECTED.  The SARS-CoV-2 RNA is generally detectable in upper respiratory specimens during the acute phase of infection. The lowest concentration of SARS-CoV-2 viral copies this assay can detect is 138 copies/mL. A negative result does not preclude SARS-Cov-2 infection and should not be used as the sole basis for treatment or other patient management decisions. A negative result may occur with  improper specimen collection/handling, submission of specimen other than nasopharyngeal swab, presence of viral mutation(s) within the areas targeted by this assay, and inadequate number of viral copies(<138 copies/mL). A negative result must be combined with clinical observations, patient history, and epidemiological information. The expected result is Negative.  Fact Sheet for Patients:  EntrepreneurPulse.com.au  Fact Sheet for Healthcare Providers:  IncredibleEmployment.be  This test is no t yet approved or cleared by the [place] FDA and  has been authorized for detection and/or diagnosis of SARS-CoV-2 by FDA under an Emergency Use Authorization (EUA). This EUA will remain  in effect (meaning this test can be used) for the duration of the COVID-19 declaration under Section 564(b)(1) of the Act, 21 U.S.C.section 360bbb-3(b)(1), unless the authorization is terminated  or revoked sooner.       Influenza A by PCR NEGATIVE NEGATIVE Final   Influenza B by PCR NEGATIVE NEGATIVE Final    Comment: (NOTE) The Xpert Xpress  SARS-CoV-2/FLU/RSV plus assay is intended as an aid in the diagnosis of influenza from Nasopharyngeal swab specimens and should not be used as a sole basis for treatment. Nasal washings and aspirates are unacceptable for Xpert Xpress SARS-CoV-2/FLU/RSV testing.  Fact Sheet for Patients: EntrepreneurPulse.com.au  Fact Sheet for Healthcare Providers: IncredibleEmployment.be  This test is not yet approved or cleared by the [place] FDA and has been authorized for detection and/or diagnosis of SARS-CoV-2 by FDA under an Emergency Use Authorization (EUA). This EUA will remain in effect (meaning this test can be used) for the duration of the COVID-19 declaration under Section 564(b)(1) of the  Act, 21 U.S.C. section 360bbb-3(b)(1), unless the authorization is terminated or revoked.  Performed at [facility], [address] [place], [place] [address]   Gastrointestinal Panel by PCR , Stool     Status: None   Collection Time: 12/21/21  9:46 PM   Specimen: Stool  Result Value Ref Range Status   Campylobacter species NOT DETECTED NOT DETECTED Final   Plesimonas shigelloides NOT DETECTED NOT DETECTED Final   Salmonella species NOT DETECTED NOT DETECTED Final   Yersinia enterocolitica NOT DETECTED NOT DETECTED Final   Vibrio species NOT DETECTED NOT DETECTED Final   Vibrio cholerae NOT DETECTED NOT DETECTED Final   Enteroaggregative E coli (EAEC) NOT DETECTED NOT DETECTED Final   Enteropathogenic E coli (EPEC) NOT DETECTED NOT DETECTED Final   Enterotoxigenic E coli (ETEC) NOT DETECTED NOT DETECTED Final   Shiga like toxin producing E coli (STEC) NOT DETECTED NOT DETECTED Final   Shigella/Enteroinvasive E coli (EIEC) NOT DETECTED NOT DETECTED Final   Cryptosporidium NOT DETECTED NOT DETECTED Final   Cyclospora cayetanensis NOT DETECTED NOT DETECTED Final   Entamoeba histolytica NOT DETECTED NOT DETECTED Final   Giardia lamblia NOT  DETECTED NOT DETECTED Final   Adenovirus F40/41 NOT DETECTED NOT DETECTED Final   Astrovirus NOT DETECTED NOT DETECTED Final   Norovirus GI/GII NOT DETECTED NOT DETECTED Final   Rotavirus A NOT DETECTED NOT DETECTED Final   Sapovirus (I, II, IV, and V) NOT DETECTED NOT DETECTED Final    Comment: Performed at [facility], [address] [place], [place] [address]  C Difficile Quick Screen w PCR reflex     Status: None   Collection Time: 12/21/21  9:46 PM   Specimen: Stool  Result Value Ref Range Status   C Diff antigen NEGATIVE NEGATIVE Final   C Diff toxin NEGATIVE NEGATIVE Final   C Diff interpretation No C. difficile detected.  Final    Comment: Performed at [facility], [address] [place], [place] [address]  SARS Coronavirus 2 by RT PCR (hospital order, performed in [facility] hospital lab) *cepheid single result test* Anterior Nasal Swab     Status: None   Collection Time: 12/24/21  3:29 PM   Specimen: Anterior Nasal Swab  Result Value Ref Range Status   SARS Coronavirus 2 by RT PCR NEGATIVE NEGATIVE Final    Comment: (NOTE) SARS-CoV-2 target nucleic acids are NOT DETECTED.  The SARS-CoV-2 RNA is generally detectable in upper and lower respiratory specimens during the acute phase of infection. The lowest concentration of SARS-CoV-2 viral copies this assay can detect is 250 copies / mL. A negative result does not preclude SARS-CoV-2 infection and should not be used as the sole basis for treatment or other patient management decisions.  A negative result may occur with improper specimen collection / handling, submission of specimen other than nasopharyngeal swab, presence of viral mutation(s) within the areas targeted by this assay, and inadequate number of viral copies (<250 copies / mL). A negative result must be combined with clinical observations, patient history, and epidemiological information.  Fact Sheet for Patients:    https://www.patel.info/  Fact Sheet for Healthcare Providers: https://hall.com/  This test is not yet approved or  cleared by the [place] FDA and has been authorized for detection and/or diagnosis of SARS-CoV-2 by FDA under an Emergency Use Authorization (EUA).  This EUA will remain in effect (meaning this test can be used) for the duration of the COVID-19 declaration under Section 564(b)(1)  of the Act, 21 U.S.C. section 360bbb-3(b)(1), unless the authorization is terminated or revoked sooner.  Performed at [facility], [address] [place], [place] [address]     Labs: CBC: Recent Labs  Lab 12/21/21 2026 12/22/21 0527 12/23/21 0609 12/24/21 0729  WBC 19.9* 25.9* 18.0* 19.2*  HGB 13.1 11.8* 10.3* 10.9*  HCT 41.2 37.7* 31.3* 33.6*  MCV 96.3 97.7 94.8 93.9  PLT 247 211 168 568   Basic Metabolic Panel: Recent Labs  Lab 12/21/21 2026 12/22/21 0527 12/23/21 0609 12/24/21 0729  NA 133* 136 135 133*  K 5.0 4.1 4.0 3.6  CL 95* 108 105 104  CO2 26 21* 25 24  GLUCOSE 144* 125* 99 129*  BUN 33* 27* 27* 28*  CREATININE 1.52* 0.96 0.97 0.93  CALCIUM 9.6 7.4* 8.7* 8.7*   Liver Function Tests: Recent Labs  Lab 12/21/21 2026  AST 27  ALT 13  ALKPHOS 58  BILITOT 1.0  PROT 7.2  ALBUMIN 3.8   CBG: Recent Labs  Lab 12/22/21 1333 12/22/21 1714  GLUCAP 120* 127*    Discharge time spent: less than 30 minutes.  Signed: Sharen Hones, MD [facility] 12/25/2021

## 2021-12-25 NOTE — Progress Notes (Signed)
Patient is calm and cooperative. Does not speak but does respond to pain and stimulation. Crushed meds in pudding and patient took without issues. Sitter is at bedside. Patient had a bowel movement-pericare and pad changed. Axillary temp was elevated-tylenol suppository was given. Temp came down to 100.6 axillary-order is for temp over 101. Turned temp down in room to allow for cooling by convection. Telemetry called stating that patient had a tele order in but is not on tele. Informed tele that patient has been here for 3 days without tele. Contacted MD Damita Dunnings to get the order from the ED discontinued-was informed by provider to let the attending primary know. I will pass on to day shift in the morning. Pain indicators are not present. Will continue to follow care plan.

## 2021-12-26 LAB — CULTURE, BLOOD (ROUTINE X 2)
Culture: NO GROWTH
Culture: NO GROWTH

## 2022-01-17 ENCOUNTER — Emergency Department: Payer: No Typology Code available for payment source

## 2022-01-17 ENCOUNTER — Inpatient Hospital Stay
Admission: EM | Admit: 2022-01-17 | Discharge: 2022-01-19 | DRG: 853 | Disposition: A | Discharge status: Other Acute Inpatient Hospital | Payer: No Typology Code available for payment source | Attending: Internal Medicine | Admitting: Internal Medicine

## 2022-01-17 DIAGNOSIS — A419 Sepsis, unspecified organism: Secondary | ICD-10-CM

## 2022-01-17 DIAGNOSIS — Z85118 Personal history of other malignant neoplasm of bronchus and lung: Secondary | ICD-10-CM | POA: Diagnosis not present

## 2022-01-17 DIAGNOSIS — M810 Age-related osteoporosis without current pathological fracture: Secondary | ICD-10-CM | POA: Diagnosis present

## 2022-01-17 DIAGNOSIS — H9190 Unspecified hearing loss, unspecified ear: Secondary | ICD-10-CM | POA: Diagnosis present

## 2022-01-17 DIAGNOSIS — G309 Alzheimer's disease, unspecified: Secondary | ICD-10-CM | POA: Diagnosis present

## 2022-01-17 DIAGNOSIS — L89154 Pressure ulcer of sacral region, stage 4: Secondary | ICD-10-CM

## 2022-01-17 DIAGNOSIS — Z9049 Acquired absence of other specified parts of digestive tract: Secondary | ICD-10-CM

## 2022-01-17 DIAGNOSIS — Z1152 Encounter for screening for COVID-19: Secondary | ICD-10-CM

## 2022-01-17 DIAGNOSIS — R1312 Dysphagia, oropharyngeal phase: Secondary | ICD-10-CM | POA: Diagnosis present

## 2022-01-17 DIAGNOSIS — G629 Polyneuropathy, unspecified: Secondary | ICD-10-CM | POA: Diagnosis present

## 2022-01-17 DIAGNOSIS — K219 Gastro-esophageal reflux disease without esophagitis: Secondary | ICD-10-CM | POA: Diagnosis present

## 2022-01-17 DIAGNOSIS — F419 Anxiety disorder, unspecified: Secondary | ICD-10-CM | POA: Diagnosis present

## 2022-01-17 DIAGNOSIS — Z888 Allergy status to other drugs, medicaments and biological substances status: Secondary | ICD-10-CM

## 2022-01-17 DIAGNOSIS — L89153 Pressure ulcer of sacral region, stage 3: Secondary | ICD-10-CM | POA: Diagnosis present

## 2022-01-17 DIAGNOSIS — L89611 Pressure ulcer of right heel, stage 1: Secondary | ICD-10-CM | POA: Diagnosis present

## 2022-01-17 DIAGNOSIS — L89152 Pressure ulcer of sacral region, stage 2: Secondary | ICD-10-CM | POA: Diagnosis present

## 2022-01-17 DIAGNOSIS — Y95 Nosocomial condition: Secondary | ICD-10-CM | POA: Diagnosis present

## 2022-01-17 DIAGNOSIS — E43 Unspecified severe protein-calorie malnutrition: Secondary | ICD-10-CM | POA: Diagnosis present

## 2022-01-17 DIAGNOSIS — Z885 Allergy status to narcotic agent status: Secondary | ICD-10-CM | POA: Diagnosis not present

## 2022-01-17 DIAGNOSIS — N400 Enlarged prostate without lower urinary tract symptoms: Secondary | ICD-10-CM | POA: Diagnosis present

## 2022-01-17 DIAGNOSIS — Z79899 Other long term (current) drug therapy: Secondary | ICD-10-CM

## 2022-01-17 DIAGNOSIS — A411 Sepsis due to other specified staphylococcus: Secondary | ICD-10-CM | POA: Diagnosis present

## 2022-01-17 DIAGNOSIS — F32A Depression, unspecified: Secondary | ICD-10-CM | POA: Diagnosis present

## 2022-01-17 DIAGNOSIS — Z6822 Body mass index (BMI) 22.0-22.9, adult: Secondary | ICD-10-CM

## 2022-01-17 DIAGNOSIS — R1310 Dysphagia, unspecified: Secondary | ICD-10-CM | POA: Diagnosis present

## 2022-01-17 DIAGNOSIS — J189 Pneumonia, unspecified organism: Secondary | ICD-10-CM | POA: Diagnosis present

## 2022-01-17 DIAGNOSIS — F0390 Unspecified dementia without behavioral disturbance: Secondary | ICD-10-CM | POA: Diagnosis present

## 2022-01-17 DIAGNOSIS — L03319 Cellulitis of trunk, unspecified: Secondary | ICD-10-CM

## 2022-01-17 DIAGNOSIS — L89626 Pressure-induced deep tissue damage of left heel: Secondary | ICD-10-CM | POA: Diagnosis present

## 2022-01-17 DIAGNOSIS — R6520 Severe sepsis without septic shock: Secondary | ICD-10-CM

## 2022-01-17 DIAGNOSIS — Z87891 Personal history of nicotine dependence: Secondary | ICD-10-CM | POA: Diagnosis not present

## 2022-01-17 DIAGNOSIS — F0283 Dementia in other diseases classified elsewhere, unspecified severity, with mood disturbance: Secondary | ICD-10-CM | POA: Diagnosis present

## 2022-01-17 DIAGNOSIS — S31000A Unspecified open wound of lower back and pelvis without penetration into retroperitoneum, initial encounter: Secondary | ICD-10-CM

## 2022-01-17 DIAGNOSIS — L03312 Cellulitis of back [any part except buttock]: Secondary | ICD-10-CM | POA: Diagnosis present

## 2022-01-17 DIAGNOSIS — L89150 Pressure ulcer of sacral region, unstageable: Secondary | ICD-10-CM

## 2022-01-17 LAB — COMPREHENSIVE METABOLIC PANEL
ALT: 16 U/L (ref 0–44)
AST: 24 U/L (ref 15–41)
Albumin: 2.5 g/dL — ABNORMAL LOW (ref 3.5–5.0)
Alkaline Phosphatase: 41 U/L (ref 38–126)
Anion gap: 4 — ABNORMAL LOW (ref 5–15)
BUN: 31 mg/dL — ABNORMAL HIGH (ref 8–23)
CO2: 25 mmol/L (ref 22–32)
Calcium: 8.4 mg/dL — ABNORMAL LOW (ref 8.9–10.3)
Chloride: 110 mmol/L (ref 98–111)
Creatinine, Ser: 0.99 mg/dL (ref 0.61–1.24)
GFR, Estimated: >60 mL/min (ref >60)
Glucose, Bld: 126 mg/dL — ABNORMAL HIGH (ref 70–99)
Potassium: 3.9 mmol/L (ref 3.5–5.1)
Sodium: 139 mmol/L (ref 135–145)
Total Bilirubin: 0.4 mg/dL (ref 0.3–1.2)
Total Protein: 5.7 g/dL — ABNORMAL LOW (ref 6.5–8.1)

## 2022-01-17 LAB — PROTIME-INR
INR: 1.3 — ABNORMAL HIGH (ref 0.8–1.2)
Prothrombin Time: 15.7 seconds — ABNORMAL HIGH (ref 11.4–15.2)

## 2022-01-17 LAB — CBC WITH DIFFERENTIAL/PLATELET
Abs Immature Granulocytes: 0.11 K/uL — ABNORMAL HIGH (ref 0.00–0.07)
Basophils Absolute: 0.1 K/uL (ref 0.0–0.1)
Basophils Relative: 0 %
Eosinophils Absolute: 0 K/uL (ref 0.0–0.5)
Eosinophils Relative: 0 %
HCT: 33.3 % — ABNORMAL LOW (ref 39.0–52.0)
Hemoglobin: 10.5 g/dL — ABNORMAL LOW (ref 13.0–17.0)
Immature Granulocytes: 1 %
Lymphocytes Relative: 6 %
Lymphs Abs: 1.2 K/uL (ref 0.7–4.0)
MCH: 31 pg (ref 26.0–34.0)
MCHC: 31.5 g/dL (ref 30.0–36.0)
MCV: 98.2 fL (ref 80.0–100.0)
Monocytes Absolute: 1.8 K/uL — ABNORMAL HIGH (ref 0.1–1.0)
Monocytes Relative: 9 %
Neutro Abs: 16.9 K/uL — ABNORMAL HIGH (ref 1.7–7.7)
Neutrophils Relative %: 84 %
Platelets: 164 K/uL (ref 150–400)
RBC: 3.39 MIL/uL — ABNORMAL LOW (ref 4.22–5.81)
RDW: 13.9 % (ref 11.5–15.5)
WBC: 20.1 K/uL — ABNORMAL HIGH (ref 4.0–10.5)
nRBC: 0 % (ref 0.0–0.2)

## 2022-01-17 LAB — LACTIC ACID, PLASMA: Lactic Acid, Venous: 1.3 mmol/L (ref 0.5–1.9)

## 2022-01-17 LAB — RESP PANEL BY RT-PCR (FLU A&B, COVID) ARPGX2
Influenza A by PCR: NEGATIVE
Influenza B by PCR: NEGATIVE
SARS Coronavirus 2 by RT PCR: NEGATIVE

## 2022-01-17 LAB — APTT: aPTT: 33 s (ref 24–36)

## 2022-01-17 MED ORDER — SODIUM CHLORIDE 0.9 % IV SOLN
2.0000 g | Freq: Once | INTRAVENOUS | Status: AC
  Administered 2022-01-17: 2 g via INTRAVENOUS
  Filled 2022-01-17: qty 12.5

## 2022-01-17 MED ORDER — LACTATED RINGERS IV SOLN: INTRAVENOUS | Status: DC

## 2022-01-17 MED ORDER — SODIUM CHLORIDE 0.9 % IV SOLN
2.0000 g | Freq: Two times a day (BID) | INTRAVENOUS | Status: DC
  Administered 2022-01-18 – 2022-01-19 (×3): 2 g via INTRAVENOUS
  Filled 2022-01-17: qty 12.5
  Filled 2022-01-17: qty 2
  Filled 2022-01-17: qty 12.5
  Filled 2022-01-17: qty 2

## 2022-01-17 MED ORDER — VANCOMYCIN HCL IN DEXTROSE 1-5 GM/200ML-% IV SOLN: 1000.0000 mg | Freq: Once | INTRAVENOUS | Status: DC

## 2022-01-17 MED ORDER — IOHEXOL 300 MG/ML  SOLN
100.0000 mL | Freq: Once | INTRAMUSCULAR | Status: AC | PRN
  Administered 2022-01-17: 100 mL via INTRAVENOUS

## 2022-01-17 MED ORDER — METRONIDAZOLE 500 MG/100ML IV SOLN
500.0000 mg | Freq: Once | INTRAVENOUS | Status: AC
  Administered 2022-01-17: 500 mg via INTRAVENOUS
  Filled 2022-01-17: qty 100

## 2022-01-17 MED ORDER — VANCOMYCIN HCL 750 MG/150ML IV SOLN
750.0000 mg | INTRAVENOUS | Status: DC
  Administered 2022-01-18: 750 mg via INTRAVENOUS
  Filled 2022-01-17: qty 150

## 2022-01-17 MED ORDER — SODIUM CHLORIDE 0.9 % IV SOLN
500.0000 mg | Freq: Once | INTRAVENOUS | Status: AC
  Administered 2022-01-18: 500 mg via INTRAVENOUS
  Filled 2022-01-17: qty 5

## 2022-01-17 MED ORDER — VANCOMYCIN HCL 1250 MG/250ML IV SOLN
1250.0000 mg | Freq: Once | INTRAVENOUS | Status: AC
  Administered 2022-01-17: 1250 mg via INTRAVENOUS
  Filled 2022-01-17: qty 250

## 2022-01-17 MED ORDER — LACTATED RINGERS IV BOLUS (SEPSIS)
1000.0000 mL | Freq: Once | INTRAVENOUS | Status: AC
  Administered 2022-01-17: 1000 mL via INTRAVENOUS

## 2022-01-17 NOTE — H&P (Signed)
History and Physical    Patient: Timothy Noble FFM:[id number] DOB: 1930/04/27 DOA: 01/17/2022 DOS: the patient was seen and examined on 01/17/2022 PCP: Olin Hauser, DO  Patient coming from: SNF  Chief Complaint:  Chief Complaint  Patient presents with   Fever   HPI: Timothy Noble is a 88 y.o. male with medical history significant of dementia, prior history of lung cancer, GERD, stage III decubitus ulcer, peripheral neuropathy, BPH, hard of hearing who is here with fever chills and worsening mental status from the skilled nursing facility.  Patient is unable to give any history.  Reportedly had a temperature of 101 in route.  He rectal temperature was 100.5, has a white count of 20.1 and admits sepsis criteria with normal lactic acid but clinically severely septic most likely secondary to infected decubitus ulcer with chest x-ray and CT abdomen pelvis showing right basilar pneumonia.  Patient being admitted for treatment of sepsis as a result of these decubitus ulcer and pneumonia.  Review of Systems: As mentioned in the history of present illness. All other systems reviewed and are negative. Past Medical History:  Diagnosis Date   Cancer ([place])    Dementia ([place])    GERD (gastroesophageal reflux disease)    Lung cancer ([place])    Past Surgical History:  Procedure Laterality Date   CHOLECYSTECTOMY     LUNG SURGERY Left    rotator cuff surgery     Social History:  reports that he quit smoking about 42 years ago. His smoking use included cigarettes. He has never used smokeless tobacco. He reports that he does not drink alcohol and does not use drugs.  Allergies  Allergen Reactions   Alendronate Sodium    Terazosin     Other reaction(s): Dizziness   Oxycodone     Other reaction(s): Delirium    Family History  Family history unknown: Yes    Prior to Admission medications   Medication Sig Start Date End Date Taking? Authorizing Provider  acetaminophen (TYLENOL) 325 MG  tablet Take 650 mg by mouth 3 (three) times daily. 04/16/20  Yes [provider]  ascorbic acid (VITAMIN C) 500 MG tablet Take 500 mg by mouth daily.   Yes [provider]  cholecalciferol (VITAMIN D3) 25 MCG (1000 UNIT) tablet Take 1,000 Units by mouth daily.   Yes [provider]  diclofenac Sodium (VOLTAREN) 1 % GEL APPLY 2 GRAMS TOPICALLY FOUR TIMES A DAY AS DIRECTED 11/25/19  Yes [provider]  divalproex (DEPAKOTE SPRINKLE) 125 MG capsule Take 250 mg by mouth 2 (two) times daily.   Yes [provider]  finasteride (PROSCAR) 5 MG tablet Take 5 mg by mouth daily.   Yes [provider]  gabapentin (NEURONTIN) 100 MG capsule Take 200 mg by mouth 3 (three) times daily.   Yes [provider]  lidocaine (LIDODERM) 5 % APPLY 1 PATCH TO SKIN EVERY DAY APPLY ONCE DAILY FOR UP TO 12 HOURS (12 HOURS ON THEN REMOVE FOR 12 HOURS) 03/07/20  Yes [provider]  melatonin 5 MG TABS Take 1 tablet (5 mg total) by mouth at bedtime. 09/13/21  Yes Vladimir Crofts, MD  pantoprazole (PROTONIX) 40 MG tablet Take 1 tablet (40 mg total) by mouth daily. 09/13/21 09/13/22 Yes Vladimir Crofts, MD  polyethylene glycol (MIRALAX / GLYCOLAX) 17 g packet Take 17 g by mouth 2 (two) times daily.   Yes [provider]  senna-docusate (SENOKOT-S) 8.6-50 MG tablet Take 1 tablet by mouth 2 (two)  times daily.   Yes [provider]  sertraline (ZOLOFT) 25 MG tablet Take 100 mg by mouth daily.   Yes [provider]  tamsulosin (FLOMAX) 0.4 MG CAPS capsule Take 0.4 mg by mouth at bedtime.   Yes [provider]  Tiotropium Bromide Monohydrate (SPIRIVA RESPIMAT) 1.25 MCG/ACT AERS Inhale 2 each into the lungs in the morning.   Yes [provider]  zinc oxide 20 % ointment Apply 1 Application topically 2 (two) times daily.   Yes [provider]  albuterol (PROVENTIL) (2.5 MG/3ML) 0.083% nebulizer solution Take 2.5 mg by  nebulization every 6 (six) hours as needed for wheezing or shortness of breath.    [provider]  ceFEPIme 2 g in sodium chloride 0.9 % 100 mL Inject 2 g into the vein every 12 (twelve) hours. Patient not taking: Reported on 01/17/2022 12/24/21   Sharen Hones, MD  gabapentin (NEURONTIN) 400 MG capsule Take 400 mg by mouth 3 (three) times daily. Patient not taking: Reported on 01/17/2022    [provider]  haloperidol (HALDOL) 2 MG/ML solution Take 2 mg by mouth 2 (two) times daily. Patient not taking: Reported on 01/17/2022    [provider]  metroNIDAZOLE (FLAGYL) 500 MG/100ML Inject 100 mLs (500 mg total) into the vein every 12 (twelve) hours. Patient not taking: Reported on 01/17/2022 12/24/21   Sharen Hones, MD  psyllium (REGULOID) 0.52 g capsule Take 0.52 g by mouth in the morning and at bedtime. Patient not taking: Reported on 01/17/2022    [provider]  traMADol (ULTRAM) 50 MG tablet Take 50 mg by mouth 2 (two) times daily. Patient not taking: Reported on 01/17/2022    [provider]    Physical Exam: Vitals:   01/17/22 2030 01/17/22 2100 01/17/22 2130 01/17/22 2230  BP: (!) 141/71 139/74 (!) 153/80 (!) 150/71  Pulse: 89 90 88 73  Resp: (!) 21 19 15 13   Temp:      TempSrc:      SpO2: 96% 100% 98% 99%   Constitutional: Acutely ill looking, altered mental status, NAD, calm, comfortable Eyes: PERRL, lids and conjunctivae normal ENMT: Mucous membranes are dry.  Posterior pharynx clear of any exudate or lesions.Normal dentition.  Neck: normal, supple, no masses, no thyromegaly Respiratory: clear to auscultation bilaterally, no wheezing, no crackles. Normal respiratory effort. No accessory muscle use.  Cardiovascular: Regular rate and rhythm, no murmurs / rubs / gallops. No extremity edema. 2+ pedal pulses. No carotid bruits.  Abdomen: no tenderness, no masses palpated. No hepatosplenomegaly. Bowel sounds positive.  Musculoskeletal:  Good range of motion, no joint swelling or tenderness, Skin: no rashes, lesions, ulcers. No induration Neurologic: CN 2-12 grossly intact. Sensation intact, DTR normal. Strength 5/5 in all 4.  Psychiatric: Confused, slightly agitated, responsive  Data Reviewed:  Temperature100.5, white count 20.1, hemoglobin 10.5, platelets 164.  Lactic acid 1.3.  Total protein 5.7.  Glucose 126 and BUN 31.  Creatinine 0.90 COVID-19 and influenza negative.  Chest x-ray shows hazy interstitial accentuation bilaterally head CT without contrast is negative, mild right basilar atelectasis on CT abdomen and pelvis.  Assessment and Plan:  #1 sepsis: Secondary to infected decubitus ulcer most likely pneumonia.  Patient will be admitted.  Initiate IV vancomycin, cefepime and azithromycin.  Patient came from the skilled facility so suspected healthcare associated pneumonia.  Blood cultures obtained.  We will wound cultures also.  Continue monitoring.  #2 Healthcare associated pneumonia: Continue with antibiotics as above with cultures  #  3 infected decubitus ulcer: Wound care consult and antibiotics.  Cultures.  #4 dementia: With patient's agitation noted.  Monitor on continue home Zoloft  #5 GERD: Continue with PPIs.  #6 hard of hearing: At baseline     Advance Care Planning:   Code Status: Prior full code  Consults: None  Family Communication: No family at bedside  Severity of Illness: The appropriate patient status for this patient is INPATIENT. Inpatient status is judged to be reasonable and necessary in order to provide the required intensity of service to ensure the patient's safety. The patient's presenting symptoms, physical exam findings, and initial radiographic and laboratory data in the context of their chronic comorbidities is felt to place them at high risk for further clinical deterioration. Furthermore, it is not anticipated that the patient will be medically stable for discharge from the  hospital within 2 midnights of admission.   * I certify that at the point of admission it is my clinical judgment that the patient will require inpatient hospital care spanning beyond 2 midnights from the point of admission due to high intensity of service, high risk for further deterioration and high frequency of surveillance required.*  AuthorBarbette Merino, MD 01/17/2022 11:02 PM  For on call review [email or web address].

## 2022-01-17 NOTE — ED Notes (Signed)
Report received. This RN now assuming care.

## 2022-01-17 NOTE — Consult Note (Signed)
PHARMACY -  BRIEF ANTIBIOTIC NOTE   Pharmacy has received consult(s) for vancomycin and cefepime from an ED provider.  The patient's profile has been reviewed for ht/wt/allergies/indication/available labs.    One time order(s) placed for  Cefepime 2 gram Vancomycin 1250 mg   Further antibiotics/pharmacy consults should be ordered by admitting physician if indicated.                       Thank you, Dorothe Pea, PharmD, BCPS Clinical Pharmacist   01/17/2022  8:53 PM

## 2022-01-17 NOTE — Consult Note (Addendum)
Pharmacy Antibiotic Note  Timothy Noble is a 88 y.o. male admitted on 01/17/2022 with  fevers and chills along with worsening mental status .  Had rectal temperature of 100.5, and white count of 20.1, meeting sepsis criteria. Patient also has what appears to be an infected decubitus ulcer with chest x-ray showing right atypical pneumonia. Pharmacy has been consulted for Vancomycin and Cefepime dosing.  Plan: Vancomycin 1250mg  IV x 1 ordered as loading dose, followed by Vancomycin 750 mg IV Q 24 hrs. Goal AUC 400-550. Expected AUC: 473.5 Expected Cmin: 12.5 SCr used: 0.99, Vd used: 0/72  Cefepime 2g IV Q12 hours   Weight: 57.6 kg (126 lb 15.8 oz)  Temp (24hrs), Avg:99.5 F (37.5 C), Min:99.5 F (37.5 C), Max:99.5 F (37.5 C)  Recent Labs  Lab 01/17/22 2103 01/17/22 2116  WBC  --  20.1*  CREATININE  --  0.99  LATICACIDVEN 1.3  --     Estimated Creatinine Clearance: 40.7 mL/min (by C-G formula based on SCr of 0.99 mg/dL).    Allergies  Allergen Reactions   Alendronate Sodium    Terazosin     Other reaction(s): Dizziness   Oxycodone     Other reaction(s): Delirium    Antimicrobials this admission: Vancomycin 10/23 >>  Cefepime 10/23 >> Flagyl 10/23 >>  Dose adjustments this admission: N/A  Microbiology results: 10/23 BCx: collected 10/23 UCx: pending    Thank you for allowing pharmacy to be a part of this patient's care.  Diannah Rindfleisch A Eltha Tingley 01/17/2022 11:20 PM

## 2022-01-17 NOTE — Sepsis Progress Note (Signed)
Following per sepsis protocol   

## 2022-01-17 NOTE — Consult Note (Signed)
CODE SEPSIS - PHARMACY COMMUNICATION  **Broad Spectrum Antibiotics should be administered within 1 hour of Sepsis diagnosis**  Time Code Sepsis Called/Page Received: 2038  Antibiotics Ordered: vancomycin, cefepime, metronidazole  Time of 1st antibiotic administration: 2100  Additional action taken by pharmacy: N/A  If necessary, Name of Provider/Nurse Contacted: [place], PharmD, [place] Pharmacist   01/17/2022  8:52 PM

## 2022-01-17 NOTE — ED Provider Notes (Signed)
[facility] Provider Note    Event Date/Time   First MD Initiated Contact with Patient 01/17/22 2009     (approximate)   History   Fever   HPI  Yaviel Kloster is a 88 y.o. male with past medical history of dementia GERD, peripheral neuropathy, decubitus ulcer in the sacral region who presents with fever.  History is limited and I was not in the room when EMS arrived but apparently patient had a fever at his facility.  Unclear how high the fever was but he did receive Tylenol at his facility.  Patient is unable to provide any history.   Spoke with patient's family who notes that he was recently admitted for severe sepsis colitis.  Was at [facility] and then transferred to the [place].  He was transferred from the [place] then to skilled nursing facility just 3 days ago.  Apparently they were considering debriding his sacral wound but then ultimately decided not to.    Past Medical History:  Diagnosis Date   Cancer ([place])    Dementia ([place])    GERD (gastroesophageal reflux disease)    Lung cancer [facility])     Patient Active Problem List   Diagnosis Date Noted   HCAP (healthcare-associated pneumonia) 01/17/2022   Colitis    Pancreatic lesion 12/23/2021   Dementia without behavioral disturbance ([place]) 12/22/2021   Decubitus ulcer of sacral region, stage 2 ([place]) 12/22/2021   Hyperglycemia 12/22/2021   Severe sepsis ([place]) secondary to colitis 12/21/2021   Anxiety and depression 12/21/2021   Peripheral neuropathy 12/21/2021   GERD without esophagitis 12/21/2021   Hyponatremia    Pressure injury of skin 09/14/2021   Osteoporosis 03/03/2021   Dementia with behavioral disturbance ([place]) 03/03/2021   BPH (benign prostatic hyperplasia) 02/17/2020   HOH (hard of hearing) 02/17/2020   Oropharyngeal dysphagia 02/05/2016     Physical Exam  Triage Vital Signs: ED Triage Vitals  Enc Vitals Group     BP      Pulse      Resp      Temp      Temp src      SpO2      Weight       Height      Head Circumference      Peak Flow      Pain Score      Pain Loc      Pain Edu?      Excl. in [place]?     Most recent vital signs: Vitals:   01/17/22 2230 01/17/22 2300  BP: (!) 150/71 128/72  Pulse: 73 78  Resp: 13 15  Temp:    SpO2: 99% 100%     General: Patient is sleeping, chronically ill-appearing CV:  Good peripheral perfusion.  No lower extremity swelling Resp:  Mildly tachypneic with rhonchorous cough Abd:  No distention.  Abdomen soft nontender throughout Neuro:             Patient opens his eyes during the exam and withdraws in all 4 extremities but does not localize Other:  Foul-smelling wound on the sacrum there is a necrotic center with erythema surrounding   ED Results / Procedures / Treatments  Labs (all labs ordered are listed, but only abnormal results are displayed) Labs Reviewed  COMPREHENSIVE METABOLIC PANEL - Abnormal; Notable for the following components:      Result Value   Glucose, Bld 126 (*)    BUN 31 (*)    Calcium  8.4 (*)    Total Protein 5.7 (*)    Albumin 2.5 (*)    Anion gap 4 (*)    All other components within normal limits  CBC WITH DIFFERENTIAL/PLATELET - Abnormal; Notable for the following components:   WBC 20.1 (*)    RBC 3.39 (*)    Hemoglobin 10.5 (*)    HCT 33.3 (*)    Neutro Abs 16.9 (*)    Monocytes Absolute 1.8 (*)    Abs Immature Granulocytes 0.11 (*)    All other components within normal limits  PROTIME-INR - Abnormal; Notable for the following components:   Prothrombin Time 15.7 (*)    INR 1.3 (*)    All other components within normal limits  RESP PANEL BY RT-PCR (FLU A&B, COVID) ARPGX2  CULTURE, BLOOD (ROUTINE X 2)  CULTURE, BLOOD (ROUTINE X 2)  URINE CULTURE  LACTIC ACID, PLASMA  APTT  LACTIC ACID, PLASMA  URINALYSIS, COMPLETE (UACMP) WITH MICROSCOPIC     EKG  EKG shows sinus rhythm with a left bundle branch block no Sgarbossa criteria to suggest ischemia   RADIOLOGY  Chest x-ray reviewed  interpreted myself shows some hazy opacities bilaterally  PROCEDURES:  Critical Care performed: Yes, see critical care procedure note(s)  .Critical Care  Performed by: Rada Hay, MD Authorized by: Rada Hay, MD   Critical care provider statement:    Critical care time (minutes):  30   Critical care was time spent personally by me on the following activities:  Development of treatment plan with patient or surrogate, discussions with consultants, evaluation of patient's response to treatment, examination of patient, ordering and review of laboratory studies, ordering and review of radiographic studies, ordering and performing treatments and interventions, pulse oximetry, re-evaluation of patient's condition and review of old charts   The patient is on the cardiac monitor to evaluate for evidence of arrhythmia and/or significant heart rate changes.   MEDICATIONS ORDERED IN ED: Medications  lactated ringers infusion ( Intravenous New Bag/Given 01/17/22 2123)  vancomycin (VANCOREADY) IVPB 1250 mg/250 mL (1,250 mg Intravenous New Bag/Given 01/17/22 2230)  azithromycin (ZITHROMAX) 500 mg in sodium chloride 0.9 % 250 mL IVPB (has no administration in time range)  ceFEPIme (MAXIPIME) 2 g in sodium chloride 0.9 % 100 mL IVPB (has no administration in time range)  vancomycin (VANCOREADY) IVPB 750 mg/150 mL (has no administration in time range)  lactated ringers bolus 1,000 mL (0 mLs Intravenous Stopped 01/17/22 2208)  ceFEPIme (MAXIPIME) 2 g in sodium chloride 0.9 % 100 mL IVPB (0 g Intravenous Stopped 01/17/22 2130)  metroNIDAZOLE (FLAGYL) IVPB 500 mg (0 mg Intravenous Stopped 01/17/22 2208)  iohexol (OMNIPAQUE) 300 MG/ML solution 100 mL (100 mLs Intravenous Contrast Given 01/17/22 2200)     IMPRESSION / MDM / ASSESSMENT AND PLAN / ED COURSE  I reviewed the triage vital signs and the nursing notes.                              Patient's presentation is most consistent  with acute presentation with potential threat to life or bodily function.  Differential diagnosis includes, but is not limited to, sepsis secondary to sacral wound, UTI, pneumonia, bacteremia, meningitis/encephalitis  Patient is an 88 year old male with history of dementia presenting with fever.  Patient does not provide any history is essentially nonverbal at the time my evaluation.  Rectal temp is 100.1 rest of his vitals are reassuring he looks  chronically ill.  He does open his eyes during exam and withdraws to pain in all 4 extremities.  He has a foul-smelling sacral wound with a necrotic center and surrounding erythema.  His abdomen is benign he does have some rhonchorous cough.  He was recently admitted for severe sepsis secondary to colitis and was ultimately transferred to the [place].  He resides in a nursing facility.  Given fever and altered mental status sepsis alert was called.  Will cover broadly with cefepime Flagyl vancomycin.  I will obtain a CT of the abdomen pelvis given the sacral wound.    CT of the abdomen pelvis does not have any acute findings.  UA still pending.  On CT there is likely an infiltrate.  He has received broad-spectrum antibiotics including azithromycin.  I discussed with the hospitalist and he will be admitted.     FINAL CLINICAL IMPRESSION(S) / ED DIAGNOSES   Final diagnoses:  Sepsis, due to unspecified organism, unspecified whether acute organ dysfunction present [facility])     Rx / DC Orders   ED Discharge Orders     None        Note:  This document was prepared using Dragon voice recognition software and may include unintentional dictation errors.   Rada Hay, MD 01/17/22 [id number]

## 2022-01-17 NOTE — ED Notes (Signed)
ED Provider at bedside. 

## 2022-01-17 NOTE — ED Triage Notes (Signed)
Pt presents to the ED via [facility] due to fever. Pt is from [facility]

## 2022-01-18 ENCOUNTER — Other Ambulatory Visit: Payer: Self-pay

## 2022-01-18 DIAGNOSIS — S31000A Unspecified open wound of lower back and pelvis without penetration into retroperitoneum, initial encounter: Secondary | ICD-10-CM

## 2022-01-18 DIAGNOSIS — A419 Sepsis, unspecified organism: Secondary | ICD-10-CM

## 2022-01-18 DIAGNOSIS — L89152 Pressure ulcer of sacral region, stage 2: Secondary | ICD-10-CM

## 2022-01-18 DIAGNOSIS — L03319 Cellulitis of trunk, unspecified: Secondary | ICD-10-CM

## 2022-01-18 DIAGNOSIS — L89154 Pressure ulcer of sacral region, stage 4: Secondary | ICD-10-CM

## 2022-01-18 DIAGNOSIS — E43 Unspecified severe protein-calorie malnutrition: Secondary | ICD-10-CM | POA: Insufficient documentation

## 2022-01-18 LAB — BLOOD CULTURE ID PANEL (REFLEXED) - BCID2

## 2022-01-18 LAB — PROCALCITONIN: Procalcitonin: <0.1 ng/mL

## 2022-01-18 LAB — CBC
HCT: 33.2 % — ABNORMAL LOW (ref 39.0–52.0)
Hemoglobin: 10.2 g/dL — ABNORMAL LOW (ref 13.0–17.0)
MCH: 29.7 pg (ref 26.0–34.0)
MCHC: 30.7 g/dL (ref 30.0–36.0)
MCV: 96.8 fL (ref 80.0–100.0)
Platelets: 168 10*3/uL (ref 150–400)
RBC: 3.43 MIL/uL — ABNORMAL LOW (ref 4.22–5.81)
RDW: 13.9 % (ref 11.5–15.5)
WBC: 19.4 10*3/uL — ABNORMAL HIGH (ref 4.0–10.5)
nRBC: 0 % (ref 0.0–0.2)

## 2022-01-18 LAB — COMPREHENSIVE METABOLIC PANEL
ALT: 15 U/L (ref 0–44)
AST: 22 U/L (ref 15–41)
Albumin: 2.4 g/dL — ABNORMAL LOW (ref 3.5–5.0)
Alkaline Phosphatase: 40 U/L (ref 38–126)
Anion gap: 6 (ref 5–15)
BUN: 25 mg/dL — ABNORMAL HIGH (ref 8–23)
CO2: 26 mmol/L (ref 22–32)
Calcium: 8.6 mg/dL — ABNORMAL LOW (ref 8.9–10.3)
Chloride: 108 mmol/L (ref 98–111)
Creatinine, Ser: 0.84 mg/dL (ref 0.61–1.24)
GFR, Estimated: >60 mL/min (ref >60)
Glucose, Bld: 116 mg/dL — ABNORMAL HIGH (ref 70–99)
Potassium: 3.9 mmol/L (ref 3.5–5.1)
Sodium: 140 mmol/L (ref 135–145)
Total Bilirubin: 0.9 mg/dL (ref 0.3–1.2)
Total Protein: 5.6 g/dL — ABNORMAL LOW (ref 6.5–8.1)

## 2022-01-18 LAB — URINALYSIS, COMPLETE (UACMP) WITH MICROSCOPIC
Bacteria, UA: NONE SEEN
Bilirubin Urine: NEGATIVE
Glucose, UA: NEGATIVE mg/dL
Hgb urine dipstick: NEGATIVE
Ketones, ur: NEGATIVE mg/dL
Leukocytes,Ua: NEGATIVE
Nitrite: NEGATIVE
Protein, ur: NEGATIVE mg/dL
Specific Gravity, Urine: >1.046 — ABNORMAL HIGH (ref 1.005–1.030)
pH: 5 (ref 5.0–8.0)

## 2022-01-18 LAB — MRSA NEXT GEN BY PCR, NASAL: MRSA by PCR Next Gen: DETECTED — AB

## 2022-01-18 LAB — CORTISOL-AM, BLOOD: Cortisol - AM: 19.1 ug/dL (ref 6.7–22.6)

## 2022-01-18 LAB — PROTIME-INR
INR: 1.2 (ref 0.8–1.2)
Prothrombin Time: 15 seconds (ref 11.4–15.2)

## 2022-01-18 MED ORDER — ACETAMINOPHEN 325 MG PO TABS
ORAL_TABLET | ORAL | Status: AC
  Filled 2022-01-18: qty 2

## 2022-01-18 MED ORDER — MELATONIN 5 MG PO TABS
5.0000 mg | ORAL_TABLET | Freq: Every day | ORAL | Status: DC
  Administered 2022-01-18 (×2): 5 mg via ORAL
  Filled 2022-01-18 (×2): qty 1

## 2022-01-18 MED ORDER — ACETAMINOPHEN 325 MG PO TABS
650.0000 mg | ORAL_TABLET | Freq: Once | ORAL | Status: AC
  Administered 2022-01-18: 650 mg via ORAL
  Filled 2022-01-18: qty 2

## 2022-01-18 MED ORDER — ONDANSETRON HCL 4 MG/2ML IJ SOLN
4.0000 mg | Freq: Four times a day (QID) | INTRAMUSCULAR | Status: DC | PRN
  Administered 2022-01-19: 4 mg via INTRAVENOUS

## 2022-01-18 MED ORDER — PANTOPRAZOLE SODIUM 40 MG PO TBEC
40.0000 mg | DELAYED_RELEASE_TABLET | Freq: Every day | ORAL | Status: DC
  Administered 2022-01-18: 40 mg via ORAL
  Filled 2022-01-18: qty 1

## 2022-01-18 MED ORDER — ONDANSETRON HCL 4 MG PO TABS: 4.0000 mg | ORAL_TABLET | Freq: Four times a day (QID) | ORAL | Status: DC | PRN

## 2022-01-18 MED ORDER — DIVALPROEX SODIUM 125 MG PO CSDR
250.0000 mg | DELAYED_RELEASE_CAPSULE | Freq: Two times a day (BID) | ORAL | Status: DC
  Administered 2022-01-18 (×2): 250 mg via ORAL
  Filled 2022-01-18 (×3): qty 2

## 2022-01-18 MED ORDER — VITAMIN C 500 MG PO TABS
500.0000 mg | ORAL_TABLET | Freq: Two times a day (BID) | ORAL | Status: DC
  Administered 2022-01-18: 500 mg via ORAL
  Filled 2022-01-18: qty 1

## 2022-01-18 MED ORDER — FINASTERIDE 5 MG PO TABS
5.0000 mg | ORAL_TABLET | Freq: Every day | ORAL | Status: DC
  Administered 2022-01-18: 5 mg via ORAL
  Filled 2022-01-18: qty 1

## 2022-01-18 MED ORDER — SERTRALINE HCL 50 MG PO TABS
100.0000 mg | ORAL_TABLET | Freq: Every day | ORAL | Status: DC
  Administered 2022-01-18: 100 mg via ORAL
  Filled 2022-01-18: qty 2

## 2022-01-18 MED ORDER — ADULT MULTIVITAMIN W/MINERALS CH
1.0000 | ORAL_TABLET | Freq: Every day | ORAL | Status: DC
  Administered 2022-01-18: 1 via ORAL
  Filled 2022-01-18: qty 1

## 2022-01-18 MED ORDER — VANCOMYCIN HCL 750 MG/150ML IV SOLN: 750.0000 mg | INTRAVENOUS | Status: DC

## 2022-01-18 MED ORDER — ZINC SULFATE 220 (50 ZN) MG PO CAPS
220.0000 mg | ORAL_CAPSULE | Freq: Every day | ORAL | Status: DC
  Administered 2022-01-18: 220 mg via ORAL
  Filled 2022-01-18: qty 1

## 2022-01-18 MED ORDER — SODIUM CHLORIDE 0.9 % IV SOLN: 2.0000 g | Freq: Two times a day (BID) | INTRAVENOUS | Status: DC

## 2022-01-18 MED ORDER — ENOXAPARIN SODIUM 40 MG/0.4ML IJ SOSY
40.0000 mg | PREFILLED_SYRINGE | INTRAMUSCULAR | Status: DC
  Administered 2022-01-18: 40 mg via SUBCUTANEOUS
  Filled 2022-01-18: qty 0.4

## 2022-01-18 MED ORDER — ACETAMINOPHEN 325 MG PO TABS
650.0000 mg | ORAL_TABLET | Freq: Once | ORAL | Status: AC
  Administered 2022-01-18: 650 mg via ORAL

## 2022-01-18 MED ORDER — TAMSULOSIN HCL 0.4 MG PO CAPS
0.4000 mg | ORAL_CAPSULE | Freq: Every day | ORAL | Status: DC
  Administered 2022-01-18 (×2): 0.4 mg via ORAL
  Filled 2022-01-18 (×2): qty 1

## 2022-01-18 MED ORDER — ENSURE ENLIVE PO LIQD: 237.0000 mL | Freq: Two times a day (BID) | ORAL | Status: DC

## 2022-01-18 MED ORDER — ALBUTEROL SULFATE (2.5 MG/3ML) 0.083% IN NEBU: 2.5000 mg | INHALATION_SOLUTION | Freq: Four times a day (QID) | RESPIRATORY_TRACT | Status: DC | PRN

## 2022-01-18 MED ORDER — TIOTROPIUM BROMIDE MONOHYDRATE 18 MCG IN CAPS
1.0000 | ORAL_CAPSULE | Freq: Every day | RESPIRATORY_TRACT | Status: DC
  Administered 2022-01-18: 18 ug via RESPIRATORY_TRACT
  Filled 2022-01-18: qty 5

## 2022-01-18 MED ORDER — VANCOMYCIN HCL IN DEXTROSE 1-5 GM/200ML-% IV SOLN: 1000.0000 mg | Freq: Once | INTRAVENOUS | Status: DC

## 2022-01-18 MED ORDER — DEXTROSE IN LACTATED RINGERS 5 % IV SOLN: INTRAVENOUS | Status: DC

## 2022-01-18 MED ORDER — SENNOSIDES-DOCUSATE SODIUM 8.6-50 MG PO TABS
1.0000 | ORAL_TABLET | Freq: Two times a day (BID) | ORAL | Status: DC
  Administered 2022-01-18 (×2): 1 via ORAL
  Filled 2022-01-18 (×3): qty 1

## 2022-01-18 MED ORDER — GABAPENTIN 100 MG PO CAPS
200.0000 mg | ORAL_CAPSULE | Freq: Three times a day (TID) | ORAL | Status: DC
  Administered 2022-01-18 (×3): 200 mg via ORAL
  Filled 2022-01-18 (×3): qty 2

## 2022-01-18 NOTE — Plan of Care (Signed)
Patient arrived to unit from ED accompanied by son and daughter. Patient resting. Dressing to sacrum changed and bed pad and gown changed. Patients heels have pressure injuries so heels are elevated off bed with pillows. 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 Vitals stable. Problem: Fluid Volume: Goal: Hemodynamic stability will improve Outcome: Progressing   Problem: Clinical Measurements: Goal: Diagnostic test results will improve Outcome: Progressing Goal: Signs and symptoms of infection will decrease Outcome: Progressing   Problem: Respiratory: Goal: Ability to maintain adequate ventilation will improve Outcome: Progressing

## 2022-01-18 NOTE — Consult Note (Signed)
Date of Consultation:  01/18/2022  Requesting Physician:  Sharen Hones, MD  Reason for Consultation:  Sacral decubitus wound  History of Present Illness: Timothy Noble is a 88 y.o. male admitted yesterday with fevers and septic picture due to infected decubitus ulcer and right basilar pneumonia.  Patient was admitted to the hospitalist team and has been started on cefepime and vancomycin.  Blood cultures are showing GPC's.  He has a recent history of being hospitalized for colitis and per his daughter, he was in the hospital for about a month.  He was discharged to nursing home recently came from there with the fevers and malaise.  He was also more confused.  In the emergency room, his work-up showed a white blood cell count of 20.1 with lactic acid of 1.3.  Normal creatinine of 0.99.  He had a CT scan of the abdomen and pelvis which showed a right basilar atelectasis/infiltrate as well as skin thickening over the distal aspect of the sacrum.  CT scan of the head did not show any acute pathology.  Today, the patient's white blood cell count is a little bit improved to 19.4.  Vital signs are more stable and normal without requiring any pressors.  General surgery has been consulted for evaluation of his sacral decubitus wound for possible debridement.  Of note, the patient is under the care of the department of social services and I have also spoken with Ms. [name] about the patient's condition.  I have also spoken with the patient's daughter Ms. Hosie Spangle.  He is also a patient of the [place] and currently has been accepted in transfer but there are no beds available tonight.  They have recommended that we continue with debridement as currently planned.  Past Medical History: Past Medical History:  Diagnosis Date   Cancer ([place])    Dementia ([place])    GERD (gastroesophageal reflux disease)    Lung cancer ([place])      Past Surgical History: Past Surgical History:  Procedure Laterality Date    CHOLECYSTECTOMY     LUNG SURGERY Left    rotator cuff surgery      Home Medications: Prior to Admission medications   Medication Sig Start Date End Date Taking? Authorizing Provider  acetaminophen (TYLENOL) 325 MG tablet Take 650 mg by mouth 3 (three) times daily. 04/16/20  Yes [provider]  ascorbic acid (VITAMIN C) 500 MG tablet Take 500 mg by mouth daily.   Yes [provider]  cholecalciferol (VITAMIN D3) 25 MCG (1000 UNIT) tablet Take 1,000 Units by mouth daily.   Yes [provider]  diclofenac Sodium (VOLTAREN) 1 % GEL APPLY 2 GRAMS TOPICALLY FOUR TIMES A DAY AS DIRECTED 11/25/19  Yes [provider]  divalproex (DEPAKOTE SPRINKLE) 125 MG capsule Take 250 mg by mouth 2 (two) times daily.   Yes [provider]  finasteride (PROSCAR) 5 MG tablet Take 5 mg by mouth daily.   Yes [provider]  gabapentin (NEURONTIN) 100 MG capsule Take 200 mg by mouth 3 (three) times daily.   Yes [provider]  lidocaine (LIDODERM) 5 % APPLY 1 PATCH TO SKIN EVERY DAY APPLY ONCE DAILY FOR UP TO 12 HOURS (12 HOURS ON THEN REMOVE FOR 12 HOURS) 03/07/20  Yes [provider]  melatonin 5 MG TABS Take 1 tablet (5 mg total) by mouth at bedtime. 09/13/21  Yes Vladimir Crofts, MD  pantoprazole (PROTONIX) 40 MG tablet Take 1 tablet (40 mg total) by  mouth daily. 09/13/21 09/13/22 Yes Vladimir Crofts, MD  polyethylene glycol (MIRALAX / GLYCOLAX) 17 g packet Take 17 g by mouth 2 (two) times daily.   Yes [provider]  senna-docusate (SENOKOT-S) 8.6-50 MG tablet Take 1 tablet by mouth 2 (two) times daily.   Yes [provider]  sertraline (ZOLOFT) 25 MG tablet Take 100 mg by mouth daily.   Yes [provider]  tamsulosin (FLOMAX) 0.4 MG CAPS capsule Take 0.4 mg by mouth at bedtime.   Yes [provider]  Tiotropium Bromide Monohydrate (SPIRIVA RESPIMAT) 1.25 MCG/ACT AERS Inhale 2 each into the lungs in the morning.    Yes [provider]  zinc oxide 20 % ointment Apply 1 Application topically 2 (two) times daily.   Yes [provider]  albuterol (PROVENTIL) (2.5 MG/3ML) 0.083% nebulizer solution Take 2.5 mg by nebulization every 6 (six) hours as needed for wheezing or shortness of breath.    [provider]  ceFEPIme 2 g in sodium chloride 0.9 % 100 mL Inject 2 g into the vein every 12 (twelve) hours. Patient not taking: Reported on 01/17/2022 12/24/21   Sharen Hones, MD  ceFEPIme 2 g in sodium chloride 0.9 % 100 mL Inject 2 g into the vein every 12 (twelve) hours. 01/18/22   Sharen Hones, MD  gabapentin (NEURONTIN) 400 MG capsule Take 400 mg by mouth 3 (three) times daily. Patient not taking: Reported on 01/17/2022    [provider]  haloperidol (HALDOL) 2 MG/ML solution Take 2 mg by mouth 2 (two) times daily. Patient not taking: Reported on 01/17/2022    [provider]  metroNIDAZOLE (FLAGYL) 500 MG/100ML Inject 100 mLs (500 mg total) into the vein every 12 (twelve) hours. Patient not taking: Reported on 01/17/2022 12/24/21   Sharen Hones, MD  psyllium (REGULOID) 0.52 g capsule Take 0.52 g by mouth in the morning and at bedtime. Patient not taking: Reported on 01/17/2022    [provider]  traMADol (ULTRAM) 50 MG tablet Take 50 mg by mouth 2 (two) times daily. Patient not taking: Reported on 01/17/2022    [provider]  vancomycin (VANCOREADY) 750 MG/150ML SOLN Inject 150 mLs (750 mg total) into the vein daily. 01/18/22   Sharen Hones, MD    Allergies: Allergies  Allergen Reactions   Alendronate Sodium    Terazosin     Other reaction(s): Dizziness   Oxycodone     Other reaction(s): Delirium    Social History:  reports that he quit smoking about 42 years ago. His smoking use included cigarettes. He has never used smokeless tobacco. He reports that he does not drink alcohol and does not use drugs.   Family History: Family History   Family history unknown: Yes    Review of Systems: Review of Systems  Unable to perform ROS: Dementia    Physical Exam BP 128/61 (BP Location: Left Arm)   Pulse 85   Temp 98.8 F (37.1 C)   Resp 19   Wt 57.6 kg   SpO2 98%   BMI 22.49 kg/m  CONSTITUTIONAL: No acute distress HEENT:  Normocephalic, atraumatic, extraocular motion intact.  Wearing headphones with a microphone as he is very hard of hearing NECK: Trachea is midline, and there is no jugular venous distension. RESPIRATORY:  Normal respiratory effort without pathologic use of accessory muscles. CARDIOVASCULAR: Regular rhythm and rate. MUSCULOSKELETAL:  Normal muscle strength and tone in all four extremities.  No peripheral edema or cyanosis. SKIN: The patient  has a sacral decubitus wound which is unstageable at this point.  It measures approximately 7 x 6 cm with black skin/eschar in the center surrounded by skin erythema.  There is no fluctuance or fluid but there is some foul smell coming from the wound.  There is moisture changes as well in the surrounding. NEUROLOGIC: Unable to assess PSYCH: Unable to assess  Laboratory Analysis: Results for orders placed or performed during the hospital encounter of 01/17/22 (from the past 24 hour(s))  Resp Panel by RT-PCR (Flu A&B, Covid) Anterior Nasal Swab     Status: None   Collection Time: 01/17/22  9:03 PM   Specimen: Anterior Nasal Swab  Result Value Ref Range   SARS Coronavirus 2 by RT PCR NEGATIVE NEGATIVE   Influenza A by PCR NEGATIVE NEGATIVE   Influenza B by PCR NEGATIVE NEGATIVE  Lactic acid, plasma     Status: None   Collection Time: 01/17/22  9:03 PM  Result Value Ref Range   Lactic Acid, Venous 1.3 0.5 - 1.9 mmol/L  Blood Culture (routine x 2)     Status: None (Preliminary result)   Collection Time: 01/17/22  9:03 PM   Specimen: BLOOD  Result Value Ref Range   Specimen Description BLOOD RIGHT ANTECUBITAL    Special Requests      BOTTLES DRAWN AEROBIC AND  ANAEROBIC Blood Culture adequate volume   Culture  Setup Time      GRAM POSITIVE COCCI IN BOTH AEROBIC AND ANAEROBIC BOTTLES CRITICAL VALUE NOTED.  VALUE IS CONSISTENT WITH PREVIOUSLY REPORTED AND CALLED VALUE. Performed at [facility], [address] [place], [place] [address]    Culture GRAM POSITIVE COCCI    Report Status PENDING   Blood Culture (routine x 2)     Status: None (Preliminary result)   Collection Time: 01/17/22  9:03 PM   Specimen: BLOOD  Result Value Ref Range   Specimen Description BLOOD LEFT ANTECUBITAL    Special Requests      BOTTLES DRAWN AEROBIC AND ANAEROBIC Blood Culture adequate volume   Culture  Setup Time      GRAM POSITIVE COCCI ANAEROBIC BOTTLE ONLY Organism ID to follow CRITICAL RESULT CALLED TO, READ BACK BY AND VERIFIED WITH: RAQUEL RODRIQUEZ 01/18/22 1403 KLW Performed at [facility], [address] [place],  [address]    Culture GRAM POSITIVE COCCI    Report Status PENDING   Protime-INR     Status: Abnormal   Collection Time: 01/17/22  9:03 PM  Result Value Ref Range   Prothrombin Time 15.7 (H) 11.4 - 15.2 seconds   INR 1.3 (H) 0.8 - 1.2  APTT     Status: None   Collection Time: 01/17/22  9:03 PM  Result Value Ref Range   aPTT 33 24 - 36 seconds  Blood Culture ID Panel (Reflexed)     Status: Abnormal   Collection Time: 01/17/22  9:03 PM  Result Value Ref Range   Enterococcus faecalis NOT DETECTED NOT DETECTED   Enterococcus Faecium NOT DETECTED NOT DETECTED   Listeria monocytogenes NOT DETECTED NOT DETECTED   Staphylococcus species DETECTED (A) NOT DETECTED   Staphylococcus aureus (BCID) NOT DETECTED NOT DETECTED   Staphylococcus epidermidis DETECTED (A) NOT DETECTED   Staphylococcus lugdunensis NOT DETECTED NOT DETECTED   Streptococcus species NOT DETECTED NOT DETECTED   Streptococcus agalactiae NOT DETECTED NOT DETECTED   Streptococcus pneumoniae NOT DETECTED NOT DETECTED   Streptococcus pyogenes NOT  DETECTED NOT DETECTED  A.calcoaceticus-baumannii NOT DETECTED NOT DETECTED   Bacteroides fragilis NOT DETECTED NOT DETECTED   Enterobacterales NOT DETECTED NOT DETECTED   Enterobacter cloacae complex NOT DETECTED NOT DETECTED   Escherichia coli NOT DETECTED NOT DETECTED   Klebsiella aerogenes NOT DETECTED NOT DETECTED   Klebsiella oxytoca NOT DETECTED NOT DETECTED   Klebsiella pneumoniae NOT DETECTED NOT DETECTED   Proteus species NOT DETECTED NOT DETECTED   Salmonella species NOT DETECTED NOT DETECTED   Serratia marcescens NOT DETECTED NOT DETECTED   Haemophilus influenzae NOT DETECTED NOT DETECTED   Neisseria meningitidis NOT DETECTED NOT DETECTED   Pseudomonas aeruginosa NOT DETECTED NOT DETECTED   Stenotrophomonas maltophilia NOT DETECTED NOT DETECTED   Candida albicans NOT DETECTED NOT DETECTED   Candida auris NOT DETECTED NOT DETECTED   Candida glabrata NOT DETECTED NOT DETECTED   Candida krusei NOT DETECTED NOT DETECTED   Candida parapsilosis NOT DETECTED NOT DETECTED   Candida tropicalis NOT DETECTED NOT DETECTED   Cryptococcus neoformans/gattii NOT DETECTED NOT DETECTED   Methicillin resistance mecA/C DETECTED (A) NOT DETECTED  Comprehensive metabolic panel     Status: Abnormal   Collection Time: 01/17/22  9:16 PM  Result Value Ref Range   Sodium 139 135 - 145 mmol/L   Potassium 3.9 3.5 - 5.1 mmol/L   Chloride 110 98 - 111 mmol/L   CO2 25 22 - 32 mmol/L   Glucose, Bld 126 (H) 70 - 99 mg/dL   BUN 31 (H) 8 - 23 mg/dL   Creatinine, Ser 0.99 0.61 - 1.24 mg/dL   Calcium 8.4 (L) 8.9 - 10.3 mg/dL   Total Protein 5.7 (L) 6.5 - 8.1 g/dL   Albumin 2.5 (L) 3.5 - 5.0 g/dL   AST 24 15 - 41 U/L   ALT 16 0 - 44 U/L   Alkaline Phosphatase 41 38 - 126 U/L   Total Bilirubin 0.4 0.3 - 1.2 mg/dL   GFR, Estimated >60 >60 mL/min   Anion gap 4 (L) 5 - 15  CBC with Differential     Status: Abnormal   Collection Time: 01/17/22  9:16 PM  Result Value Ref Range   WBC 20.1 (H) 4.0 - 10.5  K/uL   RBC 3.39 (L) 4.22 - 5.81 MIL/uL   Hemoglobin 10.5 (L) 13.0 - 17.0 g/dL   HCT 33.3 (L) 39.0 - 52.0 %   MCV 98.2 80.0 - 100.0 fL   MCH 31.0 26.0 - 34.0 pg   MCHC 31.5 30.0 - 36.0 g/dL   RDW 13.9 11.5 - 15.5 %   Platelets 164 150 - 400 K/uL   nRBC 0.0 0.0 - 0.2 %   Neutrophils Relative % 84 %   Neutro Abs 16.9 (H) 1.7 - 7.7 K/uL   Lymphocytes Relative 6 %   Lymphs Abs 1.2 0.7 - 4.0 K/uL   Monocytes Relative 9 %   Monocytes Absolute 1.8 (H) 0.1 - 1.0 K/uL   Eosinophils Relative 0 %   Eosinophils Absolute 0.0 0.0 - 0.5 K/uL   Basophils Relative 0 %   Basophils Absolute 0.1 0.0 - 0.1 K/uL   Immature Granulocytes 1 %   Abs Immature Granulocytes 0.11 (H) 0.00 - 0.07 K/uL  Urinalysis, Complete w Microscopic Urine, Clean Catch     Status: Abnormal   Collection Time: 01/18/22  4:16 AM  Result Value Ref Range   Color, Urine YELLOW (A) YELLOW   APPearance CLEAR (A) CLEAR   Specific Gravity, Urine >1.046 (H) 1.005 - 1.030   pH 5.0  5.0 - 8.0   Glucose, UA NEGATIVE NEGATIVE mg/dL   Hgb urine dipstick NEGATIVE NEGATIVE   Bilirubin Urine NEGATIVE NEGATIVE   Ketones, ur NEGATIVE NEGATIVE mg/dL   Protein, ur NEGATIVE NEGATIVE mg/dL   Nitrite NEGATIVE NEGATIVE   Leukocytes,Ua NEGATIVE NEGATIVE   RBC / HPF 0-5 0 - 5 RBC/hpf   WBC, UA 0-5 0 - 5 WBC/hpf   Bacteria, UA NONE SEEN NONE SEEN   Squamous Epithelial / LPF 0-5 0 - 5   Mucus PRESENT   Protime-INR     Status: None   Collection Time: 01/18/22  4:45 AM  Result Value Ref Range   Prothrombin Time 15.0 11.4 - 15.2 seconds   INR 1.2 0.8 - 1.2  Cortisol-am, blood     Status: None   Collection Time: 01/18/22  4:45 AM  Result Value Ref Range   Cortisol - AM 19.1 6.7 - 22.6 ug/dL  Procalcitonin     Status: None   Collection Time: 01/18/22  4:45 AM  Result Value Ref Range   Procalcitonin <0.10 ng/mL  CBC     Status: Abnormal   Collection Time: 01/18/22  4:45 AM  Result Value Ref Range   WBC 19.4 (H) 4.0 - 10.5 K/uL   RBC 3.43 (L)  4.22 - 5.81 MIL/uL   Hemoglobin 10.2 (L) 13.0 - 17.0 g/dL   HCT 33.2 (L) 39.0 - 52.0 %   MCV 96.8 80.0 - 100.0 fL   MCH 29.7 26.0 - 34.0 pg   MCHC 30.7 30.0 - 36.0 g/dL   RDW 13.9 11.5 - 15.5 %   Platelets 168 150 - 400 K/uL   nRBC 0.0 0.0 - 0.2 %  Comprehensive metabolic panel     Status: Abnormal   Collection Time: 01/18/22  4:45 AM  Result Value Ref Range   Sodium 140 135 - 145 mmol/L   Potassium 3.9 3.5 - 5.1 mmol/L   Chloride 108 98 - 111 mmol/L   CO2 26 22 - 32 mmol/L   Glucose, Bld 116 (H) 70 - 99 mg/dL   BUN 25 (H) 8 - 23 mg/dL   Creatinine, Ser 0.84 0.61 - 1.24 mg/dL   Calcium 8.6 (L) 8.9 - 10.3 mg/dL   Total Protein 5.6 (L) 6.5 - 8.1 g/dL   Albumin 2.4 (L) 3.5 - 5.0 g/dL   AST 22 15 - 41 U/L   ALT 15 0 - 44 U/L   Alkaline Phosphatase 40 38 - 126 U/L   Total Bilirubin 0.9 0.3 - 1.2 mg/dL   GFR, Estimated >60 >60 mL/min   Anion gap 6 5 - 15  MRSA Next Gen by PCR, Nasal     Status: Abnormal   Collection Time: 01/18/22 10:51 AM   Specimen: Nasal Mucosa; Nasal Swab  Result Value Ref Range   MRSA by PCR Next Gen DETECTED (A) NOT DETECTED    Imaging: CT HEAD WO CONTRAST (5MM)  Result Date: 01/17/2022 CLINICAL DATA:  Altered mental status. EXAM: CT HEAD WITHOUT CONTRAST TECHNIQUE: Contiguous axial images were obtained from the base of the skull through the vertex without intravenous contrast. RADIATION DOSE REDUCTION: This exam was performed according to the departmental dose-optimization program which includes automated exposure control, adjustment of the mA and/or kV according to patient size and/or use of iterative reconstruction technique. COMPARISON:  Head CT dated 09/03/2021. FINDINGS: Evaluation is limited due to motion artifact and streak artifact caused by radiation dose. Brain: There is moderate age-related atrophy and chronic microvascular ischemic  changes. There is no acute intracranial hemorrhage. No mass effect or midline shift. No extra-axial fluid collection.  Vascular: No hyperdense vessel or unexpected calcification. Skull: Normal. Negative for fracture or focal lesion. Sinuses/Orbits: Mild mucoperiosteal thickening of paranasal sinuses. The mastoid air cells are clear. No air-fluid level. Other: None IMPRESSION: 1. No acute intracranial pathology. 2. Moderate age-related atrophy and chronic microvascular ischemic changes. Electronically Signed   By: Anner Crete M.D.   On: 01/17/2022 22:38   CT ABDOMEN PELVIS W CONTRAST  Result Date: 01/17/2022 CLINICAL DATA:  Sacral decubitus ulcer and fevers, initial encounter EXAM: CT ABDOMEN AND PELVIS WITH CONTRAST TECHNIQUE: Multidetector CT imaging of the abdomen and pelvis was performed using the standard protocol following bolus administration of intravenous contrast. RADIATION DOSE REDUCTION: This exam was performed according to the departmental dose-optimization program which includes automated exposure control, adjustment of the mA and/or kV according to patient size and/or use of iterative reconstruction technique. CONTRAST:  136mL OMNIPAQUE IOHEXOL 300 MG/ML  SOLN COMPARISON:  None Available. FINDINGS: Lower chest: Lung bases are well aerated with mild right basilar atelectasis/infiltrate. No sizable parenchymal nodule is seen. Calcified hilar lymph nodes are noted consistent with prior granulomatous disease. Hepatobiliary: Gallbladder has been surgically removed. Mild prominence of the biliary tree is noted as result. No obstructive changes are seen. Pancreas: Unremarkable. No pancreatic ductal dilatation or surrounding inflammatory changes. Spleen: Normal in size without focal abnormality. Adrenals/Urinary Tract: Adrenal glands are within normal limits. Kidneys demonstrate a normal enhancement pattern bilaterally. No renal calculi or obstructive changes are seen. Normal excretion is noted on delayed images. Bladder is well distended. Stomach/Bowel: The appendix is not well visualized although no inflammatory  changes are seen. The stomach and small bowel are within normal limits. Vascular/Lymphatic: Aortic atherosclerosis. No enlarged abdominal or pelvic lymph nodes. Reproductive: Prostate is unremarkable. Other: No abdominal wall hernia or abnormality. No abdominopelvic ascites. Musculoskeletal: No acute or significant osseous findings. Some skin thickening is noted over the distal sacrum although no discrete ulcer is noted. IMPRESSION: Mild right basilar atelectasis/infiltrate. Changes of prior granulomatous disease. Skin thickening over the distal aspect of the sacrum although no discrete ulceration is seen. Electronically Signed   By: Inez Catalina M.D.   On: 01/17/2022 22:32   DG Chest Port 1 View  Result Date: 01/17/2022 CLINICAL DATA:  Fever EXAM: PORTABLE CHEST 1 VIEW COMPARISON:  12/23/2021 FINDINGS: Right lung apex obscured by the patient's facial tissues. 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 Old distal left clavicular fracture. Bony demineralization. Atherosclerotic calcification of the aortic arch. Suspected wedge resection clips in the left upper lung medially. Airway thickening is present, suggesting bronchitis or reactive airways disease. Hazy interstitial accentuation bilaterally, mildly worsened from 07/23/2021, cannot exclude mild atypical pneumonia. No cardiomegaly to further suggest acute pulmonary edema. No blunting of the costophrenic angles. IMPRESSION: 1. Hazy interstitial accentuation bilaterally, mildly worsened from 07/23/2021, cannot exclude mild atypical pneumonia. 2. Airway thickening is present, suggesting bronchitis or reactive airways disease. 3. Bony demineralization. 4. 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 Electronically Signed   By: Van Clines M.D.   On: 01/17/2022 21:28    Assessment and Plan: This is a 88 y.o. male with an unstageable sacral decubitus ulcer.  - Discussed with the patient's daughter, Ms. Hosie Spangle, as well as  part of his legal guardian team, Ms. [name], regarding the patient's wound condition and the need for debridement of the wound in the operating room.  Discussed with them that  although there may be some risks related to particularly general anesthetic in his condition, at this point if nothing is done, the wound may only get worse and potentially spread more of the infection.  Currently she already has GPC's growing in his blood cultures which could be likely from this wound.  As such, I think the risk of not debriding is worse than the risk of the breathing.  They both are in agreement.  Ms. Karlton Lemon has mentioned that her supervisor is the one that provides consent for procedures and she will give me a call so she can do a verbal consent over the phone tomorrow morning. - For now, we will make the patient n.p.o. after midnight preparation for surgery.  Continue IV antibiotics.  Patient is currently on vancomycin and cefepime.  Although the patient is accepted at the [place], there are no beds currently for tonight in the are in agreement to continue with debridement. - All of their questions have been answered  I spent 60 minutes dedicated to the care of this patient on the date of this encounter to include pre-visit review of records, face-to-face time with the patient discussing diagnosis and management, and any post-visit coordination of care.   Melvyn Neth, MD [facility]:  [phone]

## 2022-01-18 NOTE — Progress Notes (Signed)
  Progress Note   Patient: Timothy Noble NMM:[id number] DOB: [birth year] DOA: 01/17/2022     1 DOS: the patient was seen and examined on 01/18/2022   Brief hospital course: Timothy Noble is a 88 y.o. male with medical history significant of dementia, prior history of lung cancer, GERD, stage III decubitus ulcer, peripheral neuropathy, BPH, hard of hearing who is here with fever chills and worsening mental status from the skilled nursing facility.  He also had a fever of 101.5 at the facility. Upon arriving the hospital, temperature was 99.5, heart rate 90. White cells 20.1, lactic acid 1.3.  Procalcitonin level less than 0.1. Started on cefepime, and vancomycin.  Assessment and Plan: Sepsis secondary to sacral wound infection. Sacral wound infection with cellulitis. Patient met septic criteria at time of admission, significant cytosis and tachycardia.  Patient also has foul-smelling secretion from sacral wound, skin red.  Consistent with infection and cellulitis.  Patient has been followed by wound care, will obtain general surgery consult for debridement. Blood culture sent out, pending results. Continue cefepime and vancomycin.   Right lower lobe pneumonia, likely aspiration pneumonia. Dysphagia. Patient has been seen by speech therapy, continue to follow. Procalcitonin level less than 0.1, condition more likely is due to aspiration.  Alzheimer dementia  Continue some home medicines.  [facility] called, they would like to transfer patient over there, but no bed.     Subjective:  Patient is very hard of hearing, has some confusion.  Physical Exam: Vitals:   01/18/22 0013 01/18/22 0254 01/18/22 0517 01/18/22 0822  BP:  138/77  (!) 119/57  Pulse:  76 78 85  Resp:  $Remo'17 16 18  'Hmudj$ Temp: 98.9 F (37.2 C)   98.7 F (37.1 C)  TempSrc: Oral     SpO2:  99% 100% 98%  Weight:       General exam: Appears calm and comfortable  Respiratory system: Decreased breathing sounds without crackles.  Respiratory effort normal. Cardiovascular system: S1 & S2 heard, RRR. No JVD, murmurs, rubs, gallops or clicks. No pedal edema. Gastrointestinal system: Abdomen is nondistended, soft and nontender. No organomegaly or masses felt. Normal bowel sounds heard. Central nervous system: Alert and confused. Extremities: Symmetric 5 x 5 power. Skin: Sacral wound as above.   Data Reviewed:  Lab results reviewed.  Family Communication: Son updated at bedside.  Disposition: Status is: Inpatient Remains inpatient appropriate because: Severity of disease, IV antibiotics.  Planned Discharge Destination:  [place] hospital transfer pending    Time spent: 50 minutes  Author: Sharen Hones, MD 01/18/2022 12:26 PM  For on call review [email or web address].

## 2022-01-18 NOTE — Discharge Summary (Addendum)
Physician Discharge Summary   Patient: Timothy Noble MRN: [id number] DOB: Jul 15, 1930  Admit date:     01/17/2022  Discharge date: 01/18/22  Discharge Physician: Sharen Hones   PCP: Marijo File, MD   Recommendations at discharge:   Transfer to [facility] when bed available.  Discharge Diagnoses: Active Problems:   Dementia without behavioral disturbance (HCC)   Decubitus ulcer of sacral region, stage 2 (HCC)   Anxiety and depression   GERD without esophagitis   BPH (benign prostatic hyperplasia)   HOH (hard of hearing)   HCAP (healthcare-associated pneumonia)   Sepsis ([place])   Sacral wound   Cellulitis of sacral region Severe protein and calorie malnutrition Resolved Problems:   * No resolved hospital problems. [facility] Course: Timothy Noble is a 88 y.o. male with medical history significant of dementia, prior history of lung cancer, GERD, stage III decubitus ulcer, peripheral neuropathy, BPH, hard of hearing who is here with fever chills and worsening mental status from the skilled nursing facility.  He also had a fever of 101.5 at the facility. Upon arriving the hospital, temperature was 99.5, heart rate 90. White cells 20.1, lactic acid 1.3.  Procalcitonin level less than 0.1. Started on cefepime, and vancomycin.  Assessment and Plan:  Sepsis secondary to sacral wound infection. Sacral wound infection with cellulitis. Positive blood culture with staph epidermis. Patient met septic criteria at time of admission, significant cytosis and tachycardia.  Patient also has foul-smelling secretion from sacral wound, skin red.  Consistent with infection and cellulitis.  Patient has been followed by wound care, will obtain general surgery consult for debridement. Blood culture sent out, pending results. Continue cefepime and vancomycin. Patient may transfer to [place] if a bed is available.    Right lower lobe pneumonia, likely aspiration  pneumonia. Dysphagia. Patient has been seen by speech therapy, continue to follow. Procalcitonin level less than 0.1, condition more likely is due to aspiration.   Alzheimer dementia  Continue some home medicines.      Consultants: ID, General surgery Procedures performed: None  [facility] Diet recommendation:  Cardiac diet DISCHARGE MEDICATION: Allergies as of 01/18/2022       Reactions   Alendronate Sodium    Terazosin    Other reaction(s): Dizziness   Oxycodone    Other reaction(s): Delirium        Medication List     STOP taking these medications    ascorbic acid 500 MG tablet Commonly known as: VITAMIN C   haloperidol 2 MG/ML solution Commonly known as: HALDOL   metroNIDAZOLE 500 MG/100ML Commonly known as: FLAGYL   psyllium 0.52 g capsule Commonly known as: REGULOID   traMADol 50 MG tablet Commonly known as: ULTRAM       TAKE these medications    acetaminophen 325 MG tablet Commonly known as: TYLENOL Take 650 mg by mouth 3 (three) times daily.   albuterol (2.5 MG/3ML) 0.083% nebulizer solution Commonly known as: PROVENTIL Take 2.5 mg by nebulization every 6 (six) hours as needed for wheezing or shortness of breath.   ceFEPIme 2 g in sodium chloride 0.9 % 100 mL Inject 2 g into the vein every 12 (twelve) hours. What changed: when to take this   cholecalciferol 25 MCG (1000 UNIT) tablet Commonly known as: VITAMIN D3 Take 1,000 Units by mouth daily.   diclofenac Sodium 1 % Gel Commonly known as: VOLTAREN APPLY 2 GRAMS TOPICALLY FOUR TIMES A DAY AS DIRECTED   divalproex  125 MG capsule Commonly known as: DEPAKOTE SPRINKLE Take 250 mg by mouth 2 (two) times daily.   finasteride 5 MG tablet Commonly known as: PROSCAR Take 5 mg by mouth daily.   gabapentin 100 MG capsule Commonly known as: NEURONTIN Take 200 mg by mouth 3 (three) times daily. What changed: Another medication with the same name was removed.  Continue taking this medication, and follow the directions you see here.   lidocaine 5 % Commonly known as: LIDODERM APPLY 1 PATCH TO SKIN EVERY DAY APPLY ONCE DAILY FOR UP TO 12 HOURS (12 HOURS ON THEN REMOVE FOR 12 HOURS)   melatonin 5 MG Tabs Take 1 tablet (5 mg total) by mouth at bedtime.   pantoprazole 40 MG tablet Commonly known as: Protonix Take 1 tablet (40 mg total) by mouth daily.   polyethylene glycol 17 g packet Commonly known as: MIRALAX / GLYCOLAX Take 17 g by mouth 2 (two) times daily.   senna-docusate 8.6-50 MG tablet Commonly known as: Senokot-S Take 1 tablet by mouth 2 (two) times daily.   sertraline 25 MG tablet Commonly known as: ZOLOFT Take 100 mg by mouth daily.   Spiriva Respimat 1.25 MCG/ACT Aers Generic drug: Tiotropium Bromide Monohydrate Inhale 2 each into the lungs in the morning.   tamsulosin 0.4 MG Caps capsule Commonly known as: FLOMAX Take 0.4 mg by mouth at bedtime.   vancomycin 750 MG/150ML Soln Commonly known as: VANCOREADY Inject 150 mLs (750 mg total) into the vein daily.   zinc oxide 20 % ointment Apply 1 Application topically 2 (two) times daily.        Discharge Exam: Filed Weights   01/17/22 2300  Weight: 57.6 kg   General exam: Appears calm and comfortable  Respiratory system: Clear to auscultation. Respiratory effort normal. Cardiovascular system: S1 & S2 heard, RRR. No JVD, murmurs, rubs, gallops or clicks. No pedal edema. Gastrointestinal system: Abdomen is nondistended, soft and nontender. No organomegaly or masses felt. Normal bowel sounds heard. Central nervous system: Alert and oriented x1.  Extremities: Symmetric 5 x 5 power. Skin: No rashes, lesions or ulcers    Condition at discharge: fair  The results of significant diagnostics from this hospitalization (including imaging, microbiology, ancillary and laboratory) are listed below for reference.   Imaging Studies: CT HEAD WO CONTRAST (5MM)  Result  Date: 01/17/2022 CLINICAL DATA:  Altered mental status. EXAM: CT HEAD WITHOUT CONTRAST TECHNIQUE: Contiguous axial images were obtained from the base of the skull through the vertex without intravenous contrast. RADIATION DOSE REDUCTION: This exam was performed according to the departmental dose-optimization program which includes automated exposure control, adjustment of the mA and/or kV according to patient size and/or use of iterative reconstruction technique. COMPARISON:  Head CT dated 09/03/2021. FINDINGS: Evaluation is limited due to motion artifact and streak artifact caused by radiation dose. Brain: There is moderate age-related atrophy and chronic microvascular ischemic changes. There is no acute intracranial hemorrhage. No mass effect or midline shift. No extra-axial fluid collection. Vascular: No hyperdense vessel or unexpected calcification. Skull: Normal. Negative for fracture or focal lesion. Sinuses/Orbits: Mild mucoperiosteal thickening of paranasal sinuses. The mastoid air cells are clear. No air-fluid level. Other: None IMPRESSION: 1. No acute intracranial pathology. 2. Moderate age-related atrophy and chronic microvascular ischemic changes. Electronically Signed   By: Anner Crete M.D.   On: 01/17/2022 22:38   CT ABDOMEN PELVIS W CONTRAST  Result Date: 01/17/2022 CLINICAL DATA:  Sacral decubitus ulcer and fevers, initial encounter EXAM: CT ABDOMEN  AND PELVIS WITH CONTRAST TECHNIQUE: Multidetector CT imaging of the abdomen and pelvis was performed using the standard protocol following bolus administration of intravenous contrast. RADIATION DOSE REDUCTION: This exam was performed according to the departmental dose-optimization program which includes automated exposure control, adjustment of the mA and/or kV according to patient size and/or use of iterative reconstruction technique. CONTRAST:  162m OMNIPAQUE IOHEXOL 300 MG/ML  SOLN COMPARISON:  None Available. FINDINGS: Lower chest: Lung  bases are well aerated with mild right basilar atelectasis/infiltrate. No sizable parenchymal nodule is seen. Calcified hilar lymph nodes are noted consistent with prior granulomatous disease. Hepatobiliary: Gallbladder has been surgically removed. Mild prominence of the biliary tree is noted as result. No obstructive changes are seen. Pancreas: Unremarkable. No pancreatic ductal dilatation or surrounding inflammatory changes. Spleen: Normal in size without focal abnormality. Adrenals/Urinary Tract: Adrenal glands are within normal limits. Kidneys demonstrate a normal enhancement pattern bilaterally. No renal calculi or obstructive changes are seen. Normal excretion is noted on delayed images. Bladder is well distended. Stomach/Bowel: The appendix is not well visualized although no inflammatory changes are seen. The stomach and small bowel are within normal limits. Vascular/Lymphatic: Aortic atherosclerosis. No enlarged abdominal or pelvic lymph nodes. Reproductive: Prostate is unremarkable. Other: No abdominal wall hernia or abnormality. No abdominopelvic ascites. Musculoskeletal: No acute or significant osseous findings. Some skin thickening is noted over the distal sacrum although no discrete ulcer is noted. IMPRESSION: Mild right basilar atelectasis/infiltrate. Changes of prior granulomatous disease. Skin thickening over the distal aspect of the sacrum although no discrete ulceration is seen. Electronically Signed   By: MInez CatalinaM.D.   On: 01/17/2022 22:32   DG Chest Port 1 View  Result Date: 01/17/2022 CLINICAL DATA:  Fever EXAM: PORTABLE CHEST 1 VIEW COMPARISON:  12/23/2021 FINDINGS: Right lung apex obscured by the patient's facial tissues. 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 Old distal left clavicular fracture. Bony demineralization. Atherosclerotic calcification of the aortic arch. Suspected wedge resection clips in the left upper lung medially. Airway thickening is present,  suggesting bronchitis or reactive airways disease. Hazy interstitial accentuation bilaterally, mildly worsened from 07/23/2021, cannot exclude mild atypical pneumonia. No cardiomegaly to further suggest acute pulmonary edema. No blunting of the costophrenic angles. IMPRESSION: 1. Hazy interstitial accentuation bilaterally, mildly worsened from 07/23/2021, cannot exclude mild atypical pneumonia. 2. Airway thickening is present, suggesting bronchitis or reactive airways disease. 3. Bony demineralization. 4. 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 Electronically Signed   By: WVan ClinesM.D.   On: 01/17/2022 21:28   DG Chest Port 1 View  Result Date: 12/23/2021 CLINICAL DATA:  Fever. EXAM: PORTABLE CHEST 1 VIEW COMPARISON:  Chest x-ray 12/21/2021 FINDINGS: Lung volumes are low likely accentuating central pulmonary vascularity. There is no focal lung infiltrate, pleural effusion or pneumothorax. The cardiomediastinal silhouette is within normal limits. No acute fractures are seen. There surgical clips in the right abdomen. There are multiple healed left-sided rib fractures. IMPRESSION: No active disease. Electronically Signed   By: ARonney AstersM.D.   On: 12/23/2021 23:38   CT ABDOMEN PELVIS W CONTRAST  Result Date: 12/21/2021 CLINICAL DATA:  Nausea and vomiting. EXAM: CT ABDOMEN AND PELVIS WITH CONTRAST TECHNIQUE: Multidetector CT imaging of the abdomen and pelvis was performed using the standard protocol following bolus administration of intravenous contrast. RADIATION DOSE REDUCTION: This exam was performed according to the departmental dose-optimization program which includes automated exposure control, adjustment of the mA and/or kV according to patient size and/or use of iterative  reconstruction technique. CONTRAST:  68m OMNIPAQUE IOHEXOL 300 MG/ML  SOLN COMPARISON:  CT abdomen and pelvis 06/16/2020 FINDINGS: Lower chest: There is atelectasis in the right lung base. Hepatobiliary:  Gallbladder surgically absent. Mild prominence of the biliary system is similar to prior. No focal liver lesions are seen. Pancreas: No pancreatic ductal dilatation or surrounding inflammatory changes. Rounded density with punctate calcification seen in the tail the pancreas measuring 16 mm image 2/30. This is unchanged. Spleen: Normal in size without focal abnormality. Calcified granuloma is present. Adrenals/Urinary Tract: The kidneys and adrenal glands are within normal limits. There is mild diffuse bladder wall thickening, unchanged. Stomach/Bowel: There is wall thickening and surrounding inflammatory stranding of the descending colon to the level the rectum. No bowel obstruction, pneumatosis or free air. The appendix is not seen. Stomach and small bowel are within normal limits. Vascular/Lymphatic: Aortic atherosclerosis. No enlarged abdominal or pelvic lymph nodes. Reproductive: Prostate gland is enlarged, unchanged. Other: No abdominal wall hernia or abnormality. No abdominopelvic ascites. Musculoskeletal: Degenerative changes affect the spine and hips. IMPRESSION: 1. Wall thickening and inflammation of the descending colon to the level of the rectum compatible with proctocolitis. 2. Stable prostatomegaly with chronic bladder outlet obstruction. 3. Rounded lesion in the pancreatic tail measures 16 mm, indeterminate. Recommend further evaluation with MRI of the pancreas. 4.  Aortic Atherosclerosis (ICD10-I70.0). Electronically Signed   By: ARonney AstersM.D.   On: 12/21/2021 21:28   DG Chest Port 1 View  Result Date: 12/21/2021 CLINICAL DATA:  Fever EXAM: PORTABLE CHEST 1 VIEW COMPARISON:  07/10/2019 FINDINGS: Lung volumes are small and pulmonary insufflation has diminished since prior examination. Minimal left basilar atelectasis or scarring is present. Probable chronic underlying interstitial changes. No pneumothorax or pleural effusion. Cardiac size within normal limits. Pulmonary vascularity is normal.  No acute bone abnormality. Multiple remote left rib fractures are noted. IMPRESSION: Pulmonary hypoinflation. Electronically Signed   By: AFidela SalisburyM.D.   On: 12/21/2021 21:02    Microbiology: Results for orders placed or performed during the hospital encounter of 01/17/22  Resp Panel by RT-PCR (Flu A&B, Covid) Anterior Nasal Swab     Status: None   Collection Time: 01/17/22  9:03 PM   Specimen: Anterior Nasal Swab  Result Value Ref Range Status   SARS Coronavirus 2 by RT PCR NEGATIVE NEGATIVE Final    Comment: (NOTE) SARS-CoV-2 target nucleic acids are NOT DETECTED.  The SARS-CoV-2 RNA is generally detectable in upper respiratory specimens during the acute phase of infection. The lowest concentration of SARS-CoV-2 viral copies this assay can detect is 138 copies/mL. A negative result does not preclude SARS-Cov-2 infection and should not be used as the sole basis for treatment or other patient management decisions. A negative result may occur with  improper specimen collection/handling, submission of specimen other than nasopharyngeal swab, presence of viral mutation(s) within the areas targeted by this assay, and inadequate number of viral copies(<138 copies/mL). A negative result must be combined with clinical observations, patient history, and epidemiological information. The expected result is Negative.  Fact Sheet for Patients:  hEntrepreneurPulse.com.au Fact Sheet for Healthcare Providers:  hIncredibleEmployment.be This test is no t yet approved or cleared by the [place] and  has been authorized for detection and/or diagnosis of SARS-CoV-2 by FDA under an Emergency Use Authorization (EUA). This EUA will remain  in effect (meaning this test can be used) for the duration of the COVID-19 declaration under Section 564(b)(1) of the Act, 21 U.S.C.section 360bbb-3(b)(1),  unless the authorization is terminated  or revoked sooner.        Influenza A by PCR NEGATIVE NEGATIVE Final   Influenza B by PCR NEGATIVE NEGATIVE Final    Comment: (NOTE) The Xpert Xpress SARS-CoV-2/FLU/RSV plus assay is intended as an aid in the diagnosis of influenza from Nasopharyngeal swab specimens and should not be used as a sole basis for treatment. Nasal washings and aspirates are unacceptable for Xpert Xpress SARS-CoV-2/FLU/RSV testing.  Fact Sheet for Patients: EntrepreneurPulse.com.au  Fact Sheet for Healthcare Providers: IncredibleEmployment.be  This test is not yet approved or cleared by the [place] FDA and has been authorized for detection and/or diagnosis of SARS-CoV-2 by FDA under an Emergency Use Authorization (EUA). This EUA will remain in effect (meaning this test can be used) for the duration of the COVID-19 declaration under Section 564(b)(1) of the Act, 21 U.S.C. section 360bbb-3(b)(1), unless the authorization is terminated or revoked.  Performed at [facility], [address] [place], [place] [address]   Blood Culture (routine x 2)     Status: None (Preliminary result)   Collection Time: 01/17/22  9:03 PM   Specimen: BLOOD  Result Value Ref Range Status   Specimen Description BLOOD RIGHT ANTECUBITAL  Final   Special Requests   Final    BOTTLES DRAWN AEROBIC AND ANAEROBIC Blood Culture adequate volume   Culture  Setup Time   Final    GRAM POSITIVE COCCI IN BOTH AEROBIC AND ANAEROBIC BOTTLES CRITICAL VALUE NOTED.  VALUE IS CONSISTENT WITH PREVIOUSLY REPORTED AND CALLED VALUE. Performed at [facility], [address] [place], [place] [address]    Culture [facility] POSITIVE COCCI  Final   Report Status PENDING  Incomplete  Blood Culture (routine x 2)     Status: None (Preliminary result)   Collection Time: 01/17/22  9:03 PM   Specimen: BLOOD  Result Value Ref Range Status   Specimen Description BLOOD LEFT ANTECUBITAL  Final   Special Requests    Final    BOTTLES DRAWN AEROBIC AND ANAEROBIC Blood Culture adequate volume   Culture  Setup Time   Final    GRAM POSITIVE COCCI ANAEROBIC BOTTLE ONLY Organism ID to follow CRITICAL RESULT CALLED TO, READ BACK BY AND VERIFIED WITH: RAQUEL RODRIQUEZ 01/18/22 1403 KLW Performed at [facility], [address] [place],  [address]    Culture GRAM POSITIVE COCCI  Final   Report Status PENDING  Incomplete  Blood Culture ID Panel (Reflexed)     Status: Abnormal   Collection Time: 01/17/22  9:03 PM  Result Value Ref Range Status   Enterococcus faecalis NOT DETECTED NOT DETECTED Final   Enterococcus Faecium NOT DETECTED NOT DETECTED Final   Listeria monocytogenes NOT DETECTED NOT DETECTED Final   Staphylococcus species DETECTED (A) NOT DETECTED Final    Comment: CRITICAL RESULT CALLED TO, READ BACK BY AND VERIFIED WITH: RAQUEL RODRIGUEZ 01/18/22 1403 KLW    Staphylococcus aureus (BCID) NOT DETECTED NOT DETECTED Final   Staphylococcus epidermidis DETECTED (A) NOT DETECTED Final    Comment: Methicillin (oxacillin) resistant coagulase negative staphylococcus. Possible blood culture contaminant (unless isolated from more than one blood culture draw or clinical case suggests pathogenicity). No antibiotic treatment is indicated for blood  culture contaminants. CRITICAL RESULT CALLED TO, READ BACK BY AND VERIFIED WITH: RAQUEL RODRIGUEZ 01/18/22 1403 KLW    Staphylococcus lugdunensis NOT DETECTED NOT DETECTED Final   Streptococcus species NOT DETECTED NOT DETECTED Final   Streptococcus agalactiae NOT  DETECTED NOT DETECTED Final   Streptococcus pneumoniae NOT DETECTED NOT DETECTED Final   Streptococcus pyogenes NOT DETECTED NOT DETECTED Final   A.calcoaceticus-baumannii NOT DETECTED NOT DETECTED Final   Bacteroides fragilis NOT DETECTED NOT DETECTED Final   Enterobacterales NOT DETECTED NOT DETECTED Final   Enterobacter cloacae complex NOT DETECTED NOT DETECTED Final   Escherichia  coli NOT DETECTED NOT DETECTED Final   Klebsiella aerogenes NOT DETECTED NOT DETECTED Final   Klebsiella oxytoca NOT DETECTED NOT DETECTED Final   Klebsiella pneumoniae NOT DETECTED NOT DETECTED Final   Proteus species NOT DETECTED NOT DETECTED Final   Salmonella species NOT DETECTED NOT DETECTED Final   Serratia marcescens NOT DETECTED NOT DETECTED Final   Haemophilus influenzae NOT DETECTED NOT DETECTED Final   Neisseria meningitidis NOT DETECTED NOT DETECTED Final   Pseudomonas aeruginosa NOT DETECTED NOT DETECTED Final   Stenotrophomonas maltophilia NOT DETECTED NOT DETECTED Final   Candida albicans NOT DETECTED NOT DETECTED Final   Candida auris NOT DETECTED NOT DETECTED Final   Candida glabrata NOT DETECTED NOT DETECTED Final   Candida krusei NOT DETECTED NOT DETECTED Final   Candida parapsilosis NOT DETECTED NOT DETECTED Final   Candida tropicalis NOT DETECTED NOT DETECTED Final   Cryptococcus neoformans/gattii NOT DETECTED NOT DETECTED Final   Methicillin resistance mecA/C DETECTED (A) NOT DETECTED Final    Comment: CRITICAL RESULT CALLED TO, READ BACK BY AND VERIFIED WITH: RAQUEL RODRIGUEZ 01/18/22 1403 KLW Performed at [facility], [address] [place], [place] [address]   MRSA Next Gen by PCR, Nasal     Status: Abnormal   Collection Time: 01/18/22 10:51 AM   Specimen: Nasal Mucosa; Nasal Swab  Result Value Ref Range Status   MRSA by PCR Next Gen DETECTED (A) NOT DETECTED Final    Comment: RESULT CALLED TO, READ BACK BY AND VERIFIED WITH: NAKIA HILL AT [id number] ON 01/18/22 BY SS (NOTE) The GeneXpert MRSA Assay (FDA approved for NASAL specimens only), is one component of a comprehensive MRSA colonization surveillance program. It is not intended to diagnose MRSA infection nor to guide or monitor treatment for MRSA infections. Test performance is not FDA approved in patients less than 60 years old. Performed at [facility], [address]  [place], [place] [address]     Labs: CBC: Recent Labs  Lab 01/17/22 2116 01/18/22 0445  WBC 20.1* 19.4*  NEUTROABS 16.9*  --   HGB 10.5* 10.2*  HCT 33.3* 33.2*  MCV 98.2 96.8  PLT 164 471   Basic Metabolic Panel: Recent Labs  Lab 01/17/22 2116 01/18/22 0445  NA 139 140  K 3.9 3.9  CL 110 108  CO2 25 26  GLUCOSE 126* 116*  BUN 31* 25*  CREATININE 0.99 0.84  CALCIUM 8.4* 8.6*   Liver Function Tests: Recent Labs  Lab 01/17/22 2116 01/18/22 0445  AST 24 22  ALT 16 15  ALKPHOS 41 40  BILITOT 0.4 0.9  PROT 5.7* 5.6*  ALBUMIN 2.5* 2.4*   CBG: No results for input(s): "GLUCAP" in the last 168 hours.  Discharge time spent: greater than 30 minutes.  Signed: Sharen Hones, MD [facility] 01/18/2022

## 2022-01-18 NOTE — Progress Notes (Signed)
Initial Nutrition Assessment  DOCUMENTATION CODES:   Severe malnutrition in context of chronic illness  INTERVENTION:   -Ensure Enlive po BID, each supplement provides 350 kcal and 20 grams of protein -Magic cup TID with meals, each supplement provides 290 kcal and 9 grams of protein  -MVI with minerals daily -30 ml Prosource TID, each supplement provides 100 kcals and 15 grams protein -Feeding assistance with meals -500 mg vitamin C BID -220 mg zinc sulfate daily x 14 days  NUTRITION DIAGNOSIS:   Severe Malnutrition related to chronic illness (dementia) as evidenced by percent weight loss, severe fat depletion, severe muscle depletion.  GOAL:   Patient will meet greater than or equal to 90% of their needs  MONITOR:   PO intake, Supplement acceptance  REASON FOR ASSESSMENT:   Malnutrition Screening Tool    ASSESSMENT:   Pt with medical history significant of dementia, prior history of lung cancer, GERD, stage III decubitus ulcer, peripheral neuropathy, BPH, hard of hearing who is here with fever chills and worsening mental status from the skilled nursing facility.  Pt admitted with sepsis secondary to sacral wound infection.    Reviewed I/O's: +1.3 L x 24 hours  Plan general surgery consult for possible debridement.   Pt sitting up in bed at time of visit. Pt acknowledged RD presence, but spoke unintelligibly. No supports present at bedside. Noted pt was residing at [facility].   Pt currently on a dysphagia 1 diet with thin liquids. No meal completion data available to assess at this time. Per nursing staff, pt requires feeding assistance.   Reviewed wt hx; pt has experienced a 20.9% wt loss over the past 6 months, which is significant for time frame.   Pt with increased nutritional needs to support wound healing. Pt would greatly benefit from addition of oral nutrition supplements.   Medications reviewed and include senokot, spiriva, and dextrose 5% in lactated ringer  infusion @ 100 ml/hr.   Lab Results  Component Value Date   HGBA1C 4.7 (L) 12/22/2021   PTA DM medications are none.   Labs reviewed: CBGS: 127 (inpatient orders for glycemic control are none).    NUTRITION - FOCUSED PHYSICAL EXAM:  Flowsheet Row Most Recent Value  Orbital Region Severe depletion  Upper Arm Region Severe depletion  Thoracic and Lumbar Region Moderate depletion  Buccal Region Severe depletion  Temple Region Severe depletion  Clavicle Bone Region Severe depletion  Clavicle and Acromion Bone Region Severe depletion  Scapular Bone Region Severe depletion  Dorsal Hand Severe depletion  Patellar Region Severe depletion  Anterior Thigh Region Severe depletion  Posterior Calf Region Severe depletion  Edema (RD Assessment) None  Hair Reviewed  Eyes Reviewed  Mouth Reviewed  Skin Reviewed  Nails Reviewed       Diet Order:   Diet Order             DIET - DYS 1 Room service appropriate? Yes with Assist; Fluid consistency: Thin  Diet effective now                   EDUCATION NEEDS:   Not appropriate for education at this time  Skin:  Skin Assessment: Skin Integrity Issues: Skin Integrity Issues:: Stage III, Stage I Stage I: bilateral heels Stage III: coccyx  Last BM:  01/17/22  Height:   Ht Readings from Last 1 Encounters:  12/21/21 5\' 3"  (1.6 m)    Weight:   Wt Readings from Last 1 Encounters:  01/17/22 57.6  kg    Ideal Body Weight:  56.4 kg  BMI:  Body mass index is 22.49 kg/m.  Estimated Nutritional Needs:   Kcal:  1700-1900  Protein:  90-105 grams  Fluid:  > 1.7 L    Loistine Chance, RD, LDN, [place] Registered Dietitian II Certified Diabetes Care and Education Specialist Please refer to [facility] for RD and/or RD on-call/weekend/after hours pager

## 2022-01-18 NOTE — Hospital Course (Signed)
Timothy Noble is a 88 y.o. male with medical history significant of dementia, prior history of lung cancer, GERD, stage III decubitus ulcer, peripheral neuropathy, BPH, hard of hearing who is here with fever chills and worsening mental status from the skilled nursing facility.  He also had a fever of 101.5 at the facility. Upon arriving the hospital, temperature was 99.5, heart rate 90. White cells 20.1, lactic acid 1.3.  Procalcitonin level less than 0.1. Started on cefepime, and vancomycin.  Patient seen in consultation by Dr. Hampton Abbot general surgery on 01/18/2022.  He was brought to the operating room on the morning of 12/30/2021 for debridement of the unstageable sacral decubitus ulcer.  Final debridement size was 8 x 8 cm.  Surgeon believe that the wound did not go down to the bone.  They were okay with darkened solution and wet-to-dry packing for right now and cover with dry gauze and ABD pad and secure with porous tape.  Patient will be transferred to [facility] for further care.  Recommend palliative care consultation.

## 2022-01-18 NOTE — Consult Note (Addendum)
Pillager Nurse Consult Note: Reason for Consult:Pressure injuries. Photo of sacral wound is provided by Provider on Admission. Consultation is performed remotely after review of the EMR including photographs, Wound type:Pressure vs pressure plus infection Pressure Injury POA: Yes Measurement:Bilateral heels and sacral wounds to be measured today by Bedside RN and documented on Nursing Flow Sheet with next dressing change. Wound bed: Right heel Stage 1 pressure injury Left heel deep tissue pressure injury Sacral wound: Unstageable pressure injury +/- infection Drainage (amount, consistency, odor) None from heels, small to moderate serosanguinous from sacral wound Periwound: Sacral wound with periwound erythema Dressing procedure/placement/frequency: I have provided the patient with a mattress replacement with low air loss feature and bilateral pressure redistribution heel boots.Turning and repositioning is in place but I have provided guidance for Nursing to minimize time in the supine position. Topical care to the bilateral heels and sacral wounds will be with NS cleanse, pat dry followed by covering of the affected areas with antimicrobial nonadherent (xeroform) gauze topped with silicone foam for atraumatic dressing changes. Changes are to be daily and PRN soiling, rolling of dressing edges or dressing dislodgement.  Recommend surgical consultation for evaluation of sacral wound to determine need for debridement or I&D if fluctuant or an infectious process is determined. If you agree, please order/arrange.  I have communicated my recommendation above to Dr. Jonelle Sidle via [place].  [facility] nursing team will not follow, but will remain available to this patient, the nursing and medical teams.  Please re-consult if needed.  Thank you for inviting [place] to participate in this patient's Plan of Care.  Maudie Flakes, MSN, RN, CNS, [place], Serita Grammes, [employer], [employer] phone:  [phone]

## 2022-01-18 NOTE — TOC Initial Note (Signed)
Transition of Care [facility]) - Initial/Assessment Note    Patient Details  Name: Timothy Noble MRN: [id number] Date of Birth: 1931-01-25  Transition of Care [facility]) CM/SW Contact:    [name], RN Phone Number: 01/18/2022, 3:19 PM  Clinical Narrative:                 Admitted for: Sepsis Admitted from: [facility] under [place] contract    Received call from [name] at [place] she is his assigned case workers [phone] S[facility] Baller [facility]  I spoke with Mechele Claude at [place] transfer center.  She request I submit the [facility] transfer paperwork, but states they are on diversion and do not have beds available. Packet faxed and received confirmation. Sonia Baller with DSS and MD updated  Per Sonia Baller family can visit unsupervised, but are not to receive medical updates.   Per Francine Graven will not accept patient back unless his wound is debrided. MD updated  Hilda Blades at [facility] to discuss with her supervisor   Expected Discharge Plan: [place] Barriers to Discharge: Continued Medical Work up   Patient Goals and CMS Choice        Expected Discharge Plan and Services Expected Discharge Plan: [place] arrangements for the past 2 months: Single Family Home                                      Prior Living Arrangements/Services Living arrangements for the past 2 months: Single Family Home Lives with:: Self                   Activities of Daily Living Home Assistive Devices/Equipment: Other (Comment) (pocket talker) ADL Screening (condition at time of admission) Patient's cognitive ability adequate to safely complete daily activities?: No Is the patient deaf or have difficulty hearing?: Yes Does the patient have difficulty seeing, even when wearing glasses/contacts?: No Does the patient have difficulty concentrating, remembering, or making decisions?: Yes Patient able to express need for assistance with ADLs?: No Does the  patient have difficulty dressing or bathing?: Yes Independently performs ADLs?: No Communication: Dependent Is this a change from baseline?: Pre-admission baseline Dressing (OT): Dependent Is this a change from baseline?: Pre-admission baseline Grooming: Dependent Is this a change from baseline?: Pre-admission baseline Feeding: Dependent Is this a change from baseline?: Pre-admission baseline Bathing: Dependent Is this a change from baseline?: Pre-admission baseline Toileting: Dependent Is this a change from baseline?: Pre-admission baseline In/Out Bed: Dependent Is this a change from baseline?: Pre-admission baseline Walks in Home: Dependent Is this a change from baseline?: Pre-admission baseline Does the patient have difficulty walking or climbing stairs?: Yes Weakness of Legs: Both Weakness of Arms/Hands: Both  Permission Sought/Granted                  Emotional Assessment              Admission diagnosis:  Severe sepsis ([name]) [A41.9, R65.20] Sepsis, due to unspecified organism, unspecified whether acute organ dysfunction present [name]) [A41.9] Patient Active Problem List   Diagnosis Date Noted   Sepsis ([name]) 01/18/2022   Sacral wound 01/18/2022   Cellulitis of sacral region 01/18/2022   HCAP (healthcare-associated pneumonia) 01/17/2022   Colitis    Pancreatic lesion 12/23/2021   Dementia without behavioral disturbance ([place]) 12/22/2021   Decubitus ulcer of sacral region, stage  2 ([place]) 12/22/2021   Hyperglycemia 12/22/2021   Anxiety and depression 12/21/2021   Peripheral neuropathy 12/21/2021   GERD without esophagitis 12/21/2021   Hyponatremia    Pressure injury of skin 09/14/2021   Osteoporosis 03/03/2021   Dementia with behavioral disturbance ([place]) 03/03/2021   BPH (benign prostatic hyperplasia) 02/17/2020   HOH (hard of hearing) 02/17/2020   Oropharyngeal dysphagia 02/05/2016   PCP:  Marijo File, MD Pharmacy:   [facility] [phone] -  [address] [place] [address] Phone: [phone] Fax: [phone]  [place], [address] [address] Phone: [phone] Fax: [phone]     Social Determinants of Health (SDOH) Interventions    Readmission Risk Interventions    01/18/2022    3:05 PM  Readmission Risk Prevention Plan  Transportation Screening Complete  Social Work Consult for [place] Planning/Counseling Complete  Medication Review Press photographer) Complete

## 2022-01-18 NOTE — Progress Notes (Signed)
PHARMACY - PHYSICIAN COMMUNICATION CRITICAL VALUE ALERT - BLOOD CULTURE IDENTIFICATION (BCID)  Timothy Noble is an 88 y.o. male who presented to [facility] on 01/17/2022 with a chief complaint of fever and worsening AMS.    Assessment:  Blood culture from 10/23 with GPC in 3/4 bottles, BCID detects MRSE.  Patient with sacral decubitus.    Name of physician (or Provider) Contacted: Dr Carole Civil  Current antibiotics: Vancomycin, Cefepime, Azithromycin   Changes to prescribed antibiotics recommended:  Patient is on recommended antibiotics - No changes needed  Results for orders placed or performed during the hospital encounter of 01/17/22  Blood Culture ID Panel (Reflexed) (Collected: 01/17/2022  9:03 PM)  Result Value Ref Range   Enterococcus faecalis NOT DETECTED NOT DETECTED   Enterococcus Faecium NOT DETECTED NOT DETECTED   Listeria monocytogenes NOT DETECTED NOT DETECTED   Staphylococcus species DETECTED (A) NOT DETECTED   Staphylococcus aureus (BCID) NOT DETECTED NOT DETECTED   Staphylococcus epidermidis DETECTED (A) NOT DETECTED   Staphylococcus lugdunensis NOT DETECTED NOT DETECTED   Streptococcus species NOT DETECTED NOT DETECTED   Streptococcus agalactiae NOT DETECTED NOT DETECTED   Streptococcus pneumoniae NOT DETECTED NOT DETECTED   Streptococcus pyogenes NOT DETECTED NOT DETECTED   A.calcoaceticus-baumannii NOT DETECTED NOT DETECTED   Bacteroides fragilis NOT DETECTED NOT DETECTED   Enterobacterales NOT DETECTED NOT DETECTED   Enterobacter cloacae complex NOT DETECTED NOT DETECTED   Escherichia coli NOT DETECTED NOT DETECTED   Klebsiella aerogenes NOT DETECTED NOT DETECTED   Klebsiella oxytoca NOT DETECTED NOT DETECTED   Klebsiella pneumoniae NOT DETECTED NOT DETECTED   Proteus species NOT DETECTED NOT DETECTED   Salmonella species NOT DETECTED NOT DETECTED   Serratia marcescens NOT DETECTED NOT DETECTED   Haemophilus influenzae NOT DETECTED NOT DETECTED   Neisseria  meningitidis NOT DETECTED NOT DETECTED   Pseudomonas aeruginosa NOT DETECTED NOT DETECTED   Stenotrophomonas maltophilia NOT DETECTED NOT DETECTED   Candida albicans NOT DETECTED NOT DETECTED   Candida auris NOT DETECTED NOT DETECTED   Candida glabrata NOT DETECTED NOT DETECTED   Candida krusei NOT DETECTED NOT DETECTED   Candida parapsilosis NOT DETECTED NOT DETECTED   Candida tropicalis NOT DETECTED NOT DETECTED   Cryptococcus neoformans/gattii NOT DETECTED NOT DETECTED   Methicillin resistance mecA/C DETECTED (A) NOT DETECTED    Doreene Eland, PharmD, BCPS, BCIDP Work Cell: [phone] 01/18/2022 2:11 PM

## 2022-01-19 ENCOUNTER — Encounter
Admission: EM | Disposition: A | Discharge status: Other Acute Inpatient Hospital | Payer: Self-pay | Source: Home / Self Care | Attending: Internal Medicine

## 2022-01-19 ENCOUNTER — Inpatient Hospital Stay: Payer: No Typology Code available for payment source | Admitting: General Practice

## 2022-01-19 ENCOUNTER — Encounter: Payer: Self-pay | Admitting: Internal Medicine

## 2022-01-19 ENCOUNTER — Other Ambulatory Visit: Payer: Self-pay

## 2022-01-19 DIAGNOSIS — L89153 Pressure ulcer of sacral region, stage 3: Secondary | ICD-10-CM

## 2022-01-19 DIAGNOSIS — F0390 Unspecified dementia without behavioral disturbance: Secondary | ICD-10-CM

## 2022-01-19 DIAGNOSIS — L89150 Pressure ulcer of sacral region, unstageable: Secondary | ICD-10-CM

## 2022-01-19 DIAGNOSIS — L89154 Pressure ulcer of sacral region, stage 4: Secondary | ICD-10-CM

## 2022-01-19 DIAGNOSIS — F32A Depression, unspecified: Secondary | ICD-10-CM

## 2022-01-19 DIAGNOSIS — N400 Enlarged prostate without lower urinary tract symptoms: Secondary | ICD-10-CM

## 2022-01-19 DIAGNOSIS — F419 Anxiety disorder, unspecified: Secondary | ICD-10-CM

## 2022-01-19 DIAGNOSIS — E43 Unspecified severe protein-calorie malnutrition: Secondary | ICD-10-CM

## 2022-01-19 HISTORY — PX: DEBRIDMENT OF DECUBITUS ULCER: SHX6276

## 2022-01-19 LAB — BASIC METABOLIC PANEL
Anion gap: 5 (ref 5–15)
BUN: 20 mg/dL (ref 8–23)
CO2: 25 mmol/L (ref 22–32)
Calcium: 8.6 mg/dL — ABNORMAL LOW (ref 8.9–10.3)
Chloride: 109 mmol/L (ref 98–111)
Creatinine, Ser: 0.71 mg/dL (ref 0.61–1.24)
GFR, Estimated: >60 mL/min (ref >60)
Glucose, Bld: 138 mg/dL — ABNORMAL HIGH (ref 70–99)
Potassium: 3.2 mmol/L — ABNORMAL LOW (ref 3.5–5.1)
Sodium: 139 mmol/L (ref 135–145)

## 2022-01-19 LAB — CBC
HCT: 27.3 % — ABNORMAL LOW (ref 39.0–52.0)
Hemoglobin: 8.6 g/dL — ABNORMAL LOW (ref 13.0–17.0)
MCH: 30.3 pg (ref 26.0–34.0)
MCHC: 31.5 g/dL (ref 30.0–36.0)
MCV: 96.1 fL (ref 80.0–100.0)
Platelets: 144 10*3/uL — ABNORMAL LOW (ref 150–400)
RBC: 2.84 MIL/uL — ABNORMAL LOW (ref 4.22–5.81)
RDW: 13.7 % (ref 11.5–15.5)
WBC: 11.5 10*3/uL — ABNORMAL HIGH (ref 4.0–10.5)
nRBC: 0 % (ref 0.0–0.2)

## 2022-01-19 LAB — MAGNESIUM: Magnesium: 1.7 mg/dL (ref 1.7–2.4)

## 2022-01-19 SURGERY — DEBRIDMENT OF DECUBITUS ULCER
Anesthesia: General

## 2022-01-19 MED ORDER — LIDOCAINE HCL (CARDIAC) PF 100 MG/5ML IV SOSY
PREFILLED_SYRINGE | INTRAVENOUS | Status: DC | PRN
  Administered 2022-01-19: 60 mg via INTRAVENOUS

## 2022-01-19 MED ORDER — 0.9 % SODIUM CHLORIDE (POUR BTL) OPTIME
TOPICAL | Status: DC | PRN
  Administered 2022-01-19: 500 mL

## 2022-01-19 MED ORDER — ADULT MULTIVITAMIN W/MINERALS CH: 1.0000 | ORAL_TABLET | Freq: Every day | ORAL | Status: DC

## 2022-01-19 MED ORDER — OXYCODONE HCL 5 MG PO TABS: 5.0000 mg | ORAL_TABLET | Freq: Three times a day (TID) | ORAL | 0 refills | Status: DC | PRN

## 2022-01-19 MED ORDER — ACETAMINOPHEN 10 MG/ML IV SOLN
INTRAVENOUS | Status: DC | PRN
  Administered 2022-01-19: 1000 mg via INTRAVENOUS

## 2022-01-19 MED ORDER — MAGNESIUM SULFATE 2 GM/50ML IV SOLN: 2.0000 g | Freq: Once | INTRAVENOUS | Status: DC

## 2022-01-19 MED ORDER — VANCOMYCIN HCL IN DEXTROSE 1-5 GM/200ML-% IV SOLN: 1000.0000 mg | INTRAVENOUS | Status: DC

## 2022-01-19 MED ORDER — SUCCINYLCHOLINE CHLORIDE 200 MG/10ML IV SOSY
PREFILLED_SYRINGE | INTRAVENOUS | Status: AC
  Filled 2022-01-19: qty 10

## 2022-01-19 MED ORDER — DEXAMETHASONE SODIUM PHOSPHATE 10 MG/ML IJ SOLN
INTRAMUSCULAR | Status: DC | PRN
  Administered 2022-01-19: 10 mg via INTRAVENOUS

## 2022-01-19 MED ORDER — SUCCINYLCHOLINE CHLORIDE 200 MG/10ML IV SOSY
PREFILLED_SYRINGE | INTRAVENOUS | Status: DC | PRN
  Administered 2022-01-19: 80 mg via INTRAVENOUS

## 2022-01-19 MED ORDER — BUPIVACAINE LIPOSOME 1.3 % IJ SUSP
INTRAMUSCULAR | Status: AC
  Filled 2022-01-19: qty 10

## 2022-01-19 MED ORDER — ACETAMINOPHEN 325 MG PO TABS: 650.0000 mg | ORAL_TABLET | Freq: Four times a day (QID) | ORAL | Status: DC | PRN

## 2022-01-19 MED ORDER — ASCORBIC ACID 500 MG PO TABS: 500.0000 mg | ORAL_TABLET | Freq: Two times a day (BID) | ORAL | Status: DC

## 2022-01-19 MED ORDER — LACTATED RINGERS IV SOLN: INTRAVENOUS | Status: DC | PRN

## 2022-01-19 MED ORDER — PROPOFOL 10 MG/ML IV BOLUS
INTRAVENOUS | Status: AC
  Filled 2022-01-19: qty 20

## 2022-01-19 MED ORDER — ENSURE ENLIVE PO LIQD: 237.0000 mL | Freq: Two times a day (BID) | ORAL | 12 refills | Status: DC

## 2022-01-19 MED ORDER — HYDROCODONE-ACETAMINOPHEN 5-325 MG PO TABS
1.0000 | ORAL_TABLET | ORAL | Status: DC | PRN
  Filled 2022-01-19: qty 1

## 2022-01-19 MED ORDER — DEXAMETHASONE SODIUM PHOSPHATE 10 MG/ML IJ SOLN
INTRAMUSCULAR | Status: AC
  Filled 2022-01-19: qty 1

## 2022-01-19 MED ORDER — ZINC SULFATE 220 (50 ZN) MG PO CAPS: 220.0000 mg | ORAL_CAPSULE | Freq: Every day | ORAL | Status: DC

## 2022-01-19 MED ORDER — PROPOFOL 10 MG/ML IV BOLUS
INTRAVENOUS | Status: DC | PRN
  Administered 2022-01-19: 120 mg via INTRAVENOUS

## 2022-01-19 MED ORDER — LIDOCAINE HCL (PF) 2 % IJ SOLN
INTRAMUSCULAR | Status: AC
  Filled 2022-01-19: qty 5

## 2022-01-19 MED ORDER — ACETAMINOPHEN 10 MG/ML IV SOLN
INTRAVENOUS | Status: AC
  Filled 2022-01-19: qty 100

## 2022-01-19 MED ORDER — PHENYLEPHRINE 80 MCG/ML (10ML) SYRINGE FOR IV PUSH (FOR BLOOD PRESSURE SUPPORT)
PREFILLED_SYRINGE | INTRAVENOUS | Status: AC
  Filled 2022-01-19: qty 10

## 2022-01-19 MED ORDER — DAKINS (1/4 STRENGTH) 0.125 % EX SOLN
CUTANEOUS | Status: DC | PRN
  Administered 2022-01-19: 1

## 2022-01-19 MED ORDER — POTASSIUM CHLORIDE 10 MEQ/100ML IV SOLN: 10.0000 meq | INTRAVENOUS | Status: AC

## 2022-01-19 MED ORDER — MAGNESIUM OXIDE -MG SUPPLEMENT 400 (240 MG) MG PO TABS: 400.0000 mg | ORAL_TABLET | Freq: Every day | ORAL | Status: DC

## 2022-01-19 MED ORDER — MORPHINE SULFATE (PF) 2 MG/ML IV SOLN
1.0000 mg | INTRAVENOUS | Status: DC | PRN
  Administered 2022-01-19: 1 mg via INTRAVENOUS
  Filled 2022-01-19: qty 1

## 2022-01-19 MED ORDER — MORPHINE SULFATE (PF) 2 MG/ML IV SOLN: 1.0000 mg | INTRAVENOUS | 0 refills | Status: DC | PRN

## 2022-01-19 MED ORDER — POTASSIUM CHLORIDE 20 MEQ PO PACK: 40.0000 meq | PACK | Freq: Once | ORAL | Status: DC

## 2022-01-19 MED ORDER — FENTANYL CITRATE (PF) 100 MCG/2ML IJ SOLN
INTRAMUSCULAR | Status: DC | PRN
  Administered 2022-01-19 (×2): 50 ug via INTRAVENOUS

## 2022-01-19 MED ORDER — ONDANSETRON HCL 4 MG/2ML IJ SOLN
INTRAMUSCULAR | Status: AC
  Filled 2022-01-19: qty 2

## 2022-01-19 MED ORDER — BUPIVACAINE-EPINEPHRINE (PF) 0.5% -1:200000 IJ SOLN
INTRAMUSCULAR | Status: DC | PRN
  Administered 2022-01-19: 40 mL

## 2022-01-19 MED ORDER — DAKINS (1/4 STRENGTH) 0.125 % EX SOLN
CUTANEOUS | Status: AC
  Filled 2022-01-19: qty 473

## 2022-01-19 MED ORDER — BUPIVACAINE-EPINEPHRINE (PF) 0.5% -1:200000 IJ SOLN
INTRAMUSCULAR | Status: AC
  Filled 2022-01-19: qty 30

## 2022-01-19 MED ORDER — FENTANYL CITRATE (PF) 100 MCG/2ML IJ SOLN: 25.0000 ug | INTRAMUSCULAR | Status: DC | PRN

## 2022-01-19 SURGICAL SUPPLY — 34 items
BNDG GAUZE DERMACEA FLUFF 4 (GAUZE/BANDAGES/DRESSINGS) IMPLANT
BNDG GZE DERMACEA 4 6PLY (GAUZE/BANDAGES/DRESSINGS) ×1
CNTNR SPEC 2.5X3XGRAD LEK (MISCELLANEOUS) ×1
CONT SPEC 4OZ STER OR WHT (MISCELLANEOUS) ×1
CONT SPEC 4OZ STRL OR WHT (MISCELLANEOUS) ×1
CONTAINER SPEC 2.5X3XGRAD LEK (MISCELLANEOUS) ×1 IMPLANT
DRAPE LAPAROTOMY 77X122 PED (DRAPES) ×1 IMPLANT
DRSG GAUZE FLUFF 36X18 (GAUZE/BANDAGES/DRESSINGS) ×1 IMPLANT
ELECT CAUTERY BLADE TIP 2.5 (TIP) ×1
ELECT REM PT RETURN 9FT ADLT (ELECTROSURGICAL) ×1
ELECTRODE CAUTERY BLDE TIP 2.5 (TIP) ×1 IMPLANT
ELECTRODE REM PT RTRN 9FT ADLT (ELECTROSURGICAL) ×1 IMPLANT
GAUZE SPONGE 4X4 12PLY STRL (GAUZE/BANDAGES/DRESSINGS) ×1 IMPLANT
GLOVE SURG SYN 7.0 (GLOVE) ×3 IMPLANT
GLOVE SURG SYN 7.0 PF PI (GLOVE) ×1 IMPLANT
GLOVE SURG SYN 7.5  E (GLOVE) ×3
GLOVE SURG SYN 7.5 E (GLOVE) ×3 IMPLANT
GLOVE SURG SYN 7.5 PF PI (GLOVE) ×1 IMPLANT
GOWN STRL REUS W/ TWL LRG LVL3 (GOWN DISPOSABLE) ×2 IMPLANT
GOWN STRL REUS W/TWL LRG LVL3 (GOWN DISPOSABLE) ×3
KIT TURNOVER KIT A (KITS) ×1 IMPLANT
LABEL OR SOLS (LABEL) ×1 IMPLANT
MANIFOLD NEPTUNE II (INSTRUMENTS) ×1 IMPLANT
NEEDLE HYPO 22GX1.5 SAFETY (NEEDLE) ×1 IMPLANT
NS IRRIG 500ML POUR BTL (IV SOLUTION) ×1 IMPLANT
PACK BASIN MINOR ARMC (MISCELLANEOUS) ×1 IMPLANT
PAD ABD DERMACEA PRESS 5X9 (GAUZE/BANDAGES/DRESSINGS) ×1 IMPLANT
SOL PREP PVP 2OZ (MISCELLANEOUS) ×2
SOLUTION PREP PVP 2OZ (MISCELLANEOUS) ×1 IMPLANT
SPONGE T-LAP 18X18 ~~LOC~~+RFID (SPONGE) ×2 IMPLANT
SYR 20ML LL LF (SYRINGE) ×1 IMPLANT
SYR BULB IRRIG 60ML STRL (SYRINGE) ×1 IMPLANT
TRAP FLUID SMOKE EVACUATOR (MISCELLANEOUS) ×1 IMPLANT
WATER STERILE IRR 500ML POUR (IV SOLUTION) ×1 IMPLANT

## 2022-01-19 NOTE — Assessment & Plan Note (Signed)
-   Continue finasteride 

## 2022-01-19 NOTE — Anesthesia Preprocedure Evaluation (Signed)
Anesthesia Evaluation  Patient identified by MRN, date of birth, ID band Patient awake    Reviewed: Allergy & Precautions, NPO status , Patient's Chart, lab work & pertinent test results  History of Anesthesia Complications Negative for: history of anesthetic complications  Airway Mallampati: Unable to assess  TM Distance: >3 FB Neck ROM: full    Dental  (+) Dental Advidsory Given   Pulmonary neg shortness of breath, pneumonia, unresolved, former smoker,    Pulmonary exam normal        Cardiovascular (-) angina(-) Past MI and (-) CABG negative cardio ROS Normal cardiovascular exam     Neuro/Psych PSYCHIATRIC DISORDERS Anxiety Depression Dementia negative neurological ROS     GI/Hepatic Neg liver ROS, GERD  ,  Endo/Other  negative endocrine ROS  Renal/GU      Musculoskeletal   Abdominal   Peds  Hematology negative hematology ROS (+)   Anesthesia Other Findings Patient has sepsis from decubitus ulcer and is currently being treated with multi antibiotics.  Past Medical History: No date: Cancer ([place]) No date: Dementia ([place]) No date: GERD (gastroesophageal reflux disease) No date: Lung cancer [facility])  Past Surgical History: No date: CHOLECYSTECTOMY No date: LUNG SURGERY; Left No date: rotator cuff surgery  BMI    Body Mass Index: 22.49 kg/m      Reproductive/Obstetrics negative OB ROS                             Anesthesia Physical Anesthesia Plan  ASA: 3  Anesthesia Plan: General ETT   Post-op Pain Management:    Induction: Intravenous  PONV Risk Score and Plan: 2 and Ondansetron, Dexamethasone, Midazolam and Treatment may vary due to age or medical condition  Airway Management Planned: Oral ETT  Additional Equipment:   Intra-op Plan:   Post-operative Plan: Extubation in OR  Informed Consent: I have reviewed the patients History and Physical, chart, labs and  discussed the procedure including the risks, benefits and alternatives for the proposed anesthesia with the patient or authorized representative who has indicated his/her understanding and acceptance.     Dental Advisory Given  Plan Discussed with: Anesthesiologist, CRNA and Surgeon  Anesthesia Plan Comments: (Patient consented for risks of anesthesia including but not limited to:  - adverse reactions to medications - damage to eyes, teeth, lips or other oral mucosa - nerve damage due to positioning  - sore throat or hoarseness - Damage to heart, brain, nerves, lungs, other parts of body or loss of life  Patient voiced understanding.)        Anesthesia Quick Evaluation

## 2022-01-19 NOTE — Progress Notes (Addendum)
01/19/2022  Subjective: No acute events overnight. Patient scheduled for surgery this morning for sacral wound debridement, possible wound vac placement.  Discussed with DSS and patient family.  He has bed accepted at the [place] and transport is available as well.  Vital signs: Temp:  [98.2 F (36.8 C)-100.8 F (38.2 C)] 98.2 F (36.8 C) (10/25 0926) Pulse Rate:  [64-85] 81 (10/25 0926) Resp:  [14-20] 16 (10/25 0926) BP: (110-128)/(54-62) 125/62 (10/25 0926) SpO2:  [94 %-98 %] 94 % (10/25 0926) Weight:  [57.6 kg] 57.6 kg (10/25 0926)   Intake/Output: 10/24 0701 - 10/25 0700 In: 2706.3 [P.O.:270; I.V.:2184.9; IV Piggyback:251.4] Out: 900 [Urine:900] Last BM Date : 01/18/22  Physical Exam: Constitutional: No acute distress Skin:  stable sacral wound with necrotic/eschar central aspect, no fluctuance or drainage.  Labs:  Recent Labs    01/18/22 0445 01/19/22 0508  WBC 19.4* 11.5*  HGB 10.2* 8.6*  HCT 33.2* 27.3*  PLT 168 144*   Recent Labs    01/18/22 0445 01/19/22 0508  NA 140 139  K 3.9 3.2*  CL 108 109  CO2 26 25  GLUCOSE 116* 138*  BUN 25* 20  CREATININE 0.84 0.71  CALCIUM 8.6* 8.6*   Recent Labs    01/17/22 2103 01/18/22 0445  LABPROT 15.7* 15.0  INR 1.3* 1.2    Imaging: No results found.  Assessment/Plan: This is a 88 y.o. male with unstageable sacral decubitus wound.  --Plant remains the same for debridement today.  Discussed again with DSS and family this morning.  Given that he will transport to [place] later, will do wet to dry dressing instead of wound vac, but he can be assessed for wound vac over there. --From surgery standpoint, should be ok to transfer to the [place] this afternoon. --Recommend low air loss mattress, frequent rotation/off-loading, and checking his dressing often, particularly after bowel movements to make sure it's not getting soiled.   I spent 35 minutes dedicated to the care of this patient on the date of this encounter to include  pre-visit review of records, face-to-face time with the patient discussing diagnosis and management, and any post-visit coordination of care.  Melvyn Neth, [facility]

## 2022-01-19 NOTE — TOC Transition Note (Signed)
Transition of Care [facility]) - CM/SW Discharge Note   Patient Details  Name: Timothy Noble MRN: [id number] Date of Birth: February 11, 1931  [facility]) CM/SW Contact:  [name], RN Phone Number: 01/19/2022, 2:08 PM   Clinical Narrative:    Patient s/p debridement Patient has been accepted by [facility] and to transfer today.  MD has spoken with accepting MD Bedside RN to call report and arrange transport [name] at transfer center request to be notified when transport has been set up.  I provided bedside RN with her contact information  Left VM for [name] Legal guardian to update on her cell phone and office phone      Barriers to Discharge: Continued Medical Work up   Patient Goals and CMS Choice        Discharge Placement                       Discharge Plan and Services                                     Social Determinants of Health (SDOH) Interventions     Readmission Risk Interventions    01/18/2022    3:05 PM  Readmission Risk Prevention Plan  Transportation Screening Complete  Social Work Consult for [place] Planning/Counseling Complete  Medication Review Press photographer) Complete

## 2022-01-19 NOTE — Assessment & Plan Note (Signed)
Continue Ensure

## 2022-01-19 NOTE — Op Note (Signed)
  Procedure Date:  01/19/2022  Pre-operative Diagnosis:  Unstageable sacral decubitus wound  Post-operative Diagnosis: Stage 4 sacral decubitus wound  Procedure:  Incision and debridement of sacral decubitus wound, skin, subcutaneous tissue, and muscle, size 8 x 8 cm  Surgeon:  Melvyn Neth, MD  Anesthesia:  General endotracheal  Estimated Blood Loss:  5 ml  Specimens:  sacral wound tissue for culture  Complications:  None  Indications for Procedure:  This is a 88 y.o. male with diagnosis of an unstageable sacral decubitus wound, requiring debridement procedure.  The risks of bleeding, abscess or infection, injury to surrounding structures, and need for further procedures were all discussed with the patient, familty, and DSS and were willing to proceed.  Description of Procedure: The patient was correctly identified in the preoperative area and brought into the operating room.  The patient was placed supine with VTE prophylaxis in place.  Appropriate time-outs were performed.  Anesthesia was induced and the patient was intubated.  Appropriate antibiotics were infused.  The patient was placed in prone position.  The patient's sacral area was prepped and draped in usual sterile fashion.  Debridement was started including the skin, subcutaneous tissue, and some muscle bilaterally, which was done using cautery.  This was sent for cultures.  Debridement was done all the way down to healthy bleeding tissue.  No bone was involved but reached the ligament tissue overlying the sacrum.  The final debridement size was 8 x 8 cm.  The wound was thoroughly irrigated.  40 ml of Exparel solution mixed with 0.5% bupivacaine with epi was infiltrated.  The wound was then packed with 1/4 strength Dakins solution wet to dry gauze, covered with ABD pad, and secured with porous tape.    The patient was then placed in supine position, emerged from anesthesia, extubated, and brought to the recovery room for  further management.  The patient tolerated the procedure well and all counts were correct at the end of the case.   Melvyn Neth, MD

## 2022-01-19 NOTE — Assessment & Plan Note (Signed)
After debridement, stage III decubitus ulcer, present on admission.

## 2022-01-19 NOTE — Assessment & Plan Note (Signed)
Patient on Zoloft

## 2022-01-19 NOTE — Discharge Summary (Addendum)
Physician Discharge Summary   Patient: Timothy Noble MRN: [id number] DOB: [birth year]  Admit date:     01/17/2022  Discharge date to [facility] 01/19/22  Discharge Physician: Loletha Grayer   PCP: Marijo File, MD   Recommendations at discharge:   Follow-up team at the [facility].  Discharge Diagnoses: Principal Problem:   Sepsis ([place]) Active Problems:   Sacral decubitus ulcer, stage III (HCC)   Dementia without behavioral disturbance (HCC)   Anxiety and depression   GERD without esophagitis   BPH (benign prostatic hyperplasia)   HOH (hard of hearing)   HCAP (healthcare-associated pneumonia)   Sacral wound   Cellulitis of sacral region   Protein-calorie malnutrition, severe   Unstageable pressure ulcer of sacral region ([place])   Decubitus ulcer of sacral region, stage 4 [facility])    Hospital Course: Timothy Noble is a 88 y.o. male with medical history significant of dementia, prior history of lung cancer, GERD, stage III decubitus ulcer, peripheral neuropathy, BPH, hard of hearing who is here with fever chills and worsening mental status from the skilled nursing facility.  He also had a fever of 101.5 at the facility. Upon arriving the hospital, temperature was 99.5, heart rate 90. White cells 20.1, lactic acid 1.3.  Procalcitonin level less than 0.1. Started on cefepime, and vancomycin.  Patient seen in consultation by Dr. Hampton Abbot general surgery on 01/18/2022.  He was brought to the operating room on the morning of 12/30/2021 for debridement of the unstageable sacral decubitus ulcer.  Final debridement size was 8 x 8 cm.  Surgeon believe that the wound did not go down to the bone.  They were okay with darkened solution and wet-to-dry packing for right now and cover with dry gauze and ABD pad and secure with porous tape.  Patient will be transferred to [facility] for further care.  Recommend palliative care consultation.  Assessment and Plan: * Sepsis ([place]) Clinical sepsis,  present on admission secondary to infected decubitus ulcer and possible pneumonia.  Patient on vancomycin and cefepime.  Staphylococcus epidermidis and Staphylococcus capitis growing out of blood culture bottles.  Would recommend repeat blood cultures tomorrow.  Sacral decubitus ulcer, stage III ([place]) After debridement, stage III decubitus ulcer, present on admission.  Dementia without behavioral disturbance ([place]) Patient on Zoloft  Anxiety and depression Patient on Zoloft  GERD without esophagitis Continue Protonix  Protein-calorie malnutrition, severe Continue Ensure  BPH (benign prostatic hyperplasia) Continue finasteride         Consultants: General surgery Procedures performed: Sacral wound debridement Disposition: Discharge to the [facility] Diet recommendation:  Dysphagia 1 diet with thin liquids DISCHARGE MEDICATION: Allergies as of 01/19/2022       Reactions   Alendronate Sodium    Terazosin    Other reaction(s): Dizziness   Oxycodone    Other reaction(s): Delirium        Medication List     STOP taking these medications    haloperidol 2 MG/ML solution Commonly known as: HALDOL   metroNIDAZOLE 500 MG/100ML Commonly known as: FLAGYL   psyllium 0.52 g capsule Commonly known as: REGULOID   traMADol 50 MG tablet Commonly known as: ULTRAM       TAKE these medications    acetaminophen 325 MG tablet Commonly known as: TYLENOL Take 650 mg by mouth 3 (three) times daily.   albuterol (2.5 MG/3ML) 0.083% nebulizer solution Commonly known as: PROVENTIL Take 2.5 mg by nebulization every 6 (six) hours as needed for wheezing or shortness of  breath.   ascorbic acid 500 MG tablet Commonly known as: VITAMIN C Take 1 tablet (500 mg total) by mouth 2 (two) times daily. What changed: when to take this   ceFEPIme 2 g in sodium chloride 0.9 % 100 mL Inject 2 g into the vein every 12 (twelve) hours. What changed: when to take this   cholecalciferol  25 MCG (1000 UNIT) tablet Commonly known as: VITAMIN D3 Take 1,000 Units by mouth daily.   diclofenac Sodium 1 % Gel Commonly known as: VOLTAREN APPLY 2 GRAMS TOPICALLY FOUR TIMES A DAY AS DIRECTED   divalproex 125 MG capsule Commonly known as: DEPAKOTE SPRINKLE Take 250 mg by mouth 2 (two) times daily.   feeding supplement Liqd Take 237 mLs by mouth 2 (two) times daily between meals.   finasteride 5 MG tablet Commonly known as: PROSCAR Take 5 mg by mouth daily.   gabapentin 100 MG capsule Commonly known as: NEURONTIN Take 200 mg by mouth 3 (three) times daily. What changed: Another medication with the same name was removed. Continue taking this medication, and follow the directions you see here.   lidocaine 5 % Commonly known as: LIDODERM APPLY 1 PATCH TO SKIN EVERY DAY APPLY ONCE DAILY FOR UP TO 12 HOURS (12 HOURS ON THEN REMOVE FOR 12 HOURS)   melatonin 5 MG Tabs Take 1 tablet (5 mg total) by mouth at bedtime.   morphine (PF) 2 MG/ML injection Inject 0.5 mLs (1 mg total) into the vein every 4 (four) hours as needed.   multivitamin with minerals Tabs tablet Take 1 tablet by mouth daily.   oxyCODONE 5 MG immediate release tablet Commonly known as: Roxicodone Take 1 tablet (5 mg total) by mouth every 8 (eight) hours as needed.   pantoprazole 40 MG tablet Commonly known as: Protonix Take 1 tablet (40 mg total) by mouth daily.   polyethylene glycol 17 g packet Commonly known as: MIRALAX / GLYCOLAX Take 17 g by mouth 2 (two) times daily.   senna-docusate 8.6-50 MG tablet Commonly known as: Senokot-S Take 1 tablet by mouth 2 (two) times daily.   sertraline 25 MG tablet Commonly known as: ZOLOFT Take 100 mg by mouth daily.   Spiriva Respimat 1.25 MCG/ACT Aers Generic drug: Tiotropium Bromide Monohydrate Inhale 2 each into the lungs in the morning.   tamsulosin 0.4 MG Caps capsule Commonly known as: FLOMAX Take 0.4 mg by mouth at bedtime.   vancomycin 1-5  GM/200ML-% Soln Commonly known as: VANCOCIN Inject 200 mLs (1,000 mg total) into the vein daily.   vancomycin 750 MG/150ML Soln Commonly known as: VANCOREADY Inject 150 mLs (750 mg total) into the vein daily.   zinc oxide 20 % ointment Apply 1 Application topically 2 (two) times daily.   zinc sulfate 220 (50 Zn) MG capsule Take 1 capsule (220 mg total) by mouth daily.        Discharge Exam: Filed Weights   01/17/22 2300 01/19/22 0926  Weight: 57.6 kg 57.6 kg   Physical Exam HENT:     Head: Normocephalic.     Mouth/Throat:     Pharynx: No oropharyngeal exudate.  Eyes:     General: Lids are normal.     Conjunctiva/sclera: Conjunctivae normal.  Cardiovascular:     Rate and Rhythm: Normal rate and regular rhythm.     Heart sounds: Normal heart sounds, S1 normal and S2 normal.  Pulmonary:     Breath sounds: No decreased breath sounds, wheezing, rhonchi or rales.  Abdominal:  Palpations: Abdomen is soft.     Tenderness: There is no abdominal tenderness.  Musculoskeletal:     Right lower leg: No swelling.     Left lower leg: No swelling.  Skin:    General: Skin is warm.  Neurological:     Mental Status: He is alert.      Condition at discharge: stable  The results of significant diagnostics from this hospitalization (including imaging, microbiology, ancillary and laboratory) are listed below for reference.   Imaging Studies: CT HEAD WO CONTRAST (5MM)  Result Date: 01/17/2022 CLINICAL DATA:  Altered mental status. EXAM: CT HEAD WITHOUT CONTRAST TECHNIQUE: Contiguous axial images were obtained from the base of the skull through the vertex without intravenous contrast. RADIATION DOSE REDUCTION: This exam was performed according to the departmental dose-optimization program which includes automated exposure control, adjustment of the mA and/or kV according to patient size and/or use of iterative reconstruction technique. COMPARISON:  Head CT dated 09/03/2021.  FINDINGS: Evaluation is limited due to motion artifact and streak artifact caused by radiation dose. Brain: There is moderate age-related atrophy and chronic microvascular ischemic changes. There is no acute intracranial hemorrhage. No mass effect or midline shift. No extra-axial fluid collection. Vascular: No hyperdense vessel or unexpected calcification. Skull: Normal. Negative for fracture or focal lesion. Sinuses/Orbits: Mild mucoperiosteal thickening of paranasal sinuses. The mastoid air cells are clear. No air-fluid level. Other: None IMPRESSION: 1. No acute intracranial pathology. 2. Moderate age-related atrophy and chronic microvascular ischemic changes. Electronically Signed   By: Anner Crete M.D.   On: 01/17/2022 22:38   CT ABDOMEN PELVIS W CONTRAST  Result Date: 01/17/2022 CLINICAL DATA:  Sacral decubitus ulcer and fevers, initial encounter EXAM: CT ABDOMEN AND PELVIS WITH CONTRAST TECHNIQUE: Multidetector CT imaging of the abdomen and pelvis was performed using the standard protocol following bolus administration of intravenous contrast. RADIATION DOSE REDUCTION: This exam was performed according to the departmental dose-optimization program which includes automated exposure control, adjustment of the mA and/or kV according to patient size and/or use of iterative reconstruction technique. CONTRAST:  131mL OMNIPAQUE IOHEXOL 300 MG/ML  SOLN COMPARISON:  None Available. FINDINGS: Lower chest: Lung bases are well aerated with mild right basilar atelectasis/infiltrate. No sizable parenchymal nodule is seen. Calcified hilar lymph nodes are noted consistent with prior granulomatous disease. Hepatobiliary: Gallbladder has been surgically removed. Mild prominence of the biliary tree is noted as result. No obstructive changes are seen. Pancreas: Unremarkable. No pancreatic ductal dilatation or surrounding inflammatory changes. Spleen: Normal in size without focal abnormality. Adrenals/Urinary Tract:  Adrenal glands are within normal limits. Kidneys demonstrate a normal enhancement pattern bilaterally. No renal calculi or obstructive changes are seen. Normal excretion is noted on delayed images. Bladder is well distended. Stomach/Bowel: The appendix is not well visualized although no inflammatory changes are seen. The stomach and small bowel are within normal limits. Vascular/Lymphatic: Aortic atherosclerosis. No enlarged abdominal or pelvic lymph nodes. Reproductive: Prostate is unremarkable. Other: No abdominal wall hernia or abnormality. No abdominopelvic ascites. Musculoskeletal: No acute or significant osseous findings. Some skin thickening is noted over the distal sacrum although no discrete ulcer is noted. IMPRESSION: Mild right basilar atelectasis/infiltrate. Changes of prior granulomatous disease. Skin thickening over the distal aspect of the sacrum although no discrete ulceration is seen. Electronically Signed   By: Inez Catalina M.D.   On: 01/17/2022 22:32   DG Chest Port 1 View  Result Date: 01/17/2022 CLINICAL DATA:  Fever EXAM: PORTABLE CHEST 1 VIEW COMPARISON:  12/23/2021 FINDINGS:  Right lung apex obscured by the patient's facial tissues. 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 Old distal left clavicular fracture. Bony demineralization. Atherosclerotic calcification of the aortic arch. Suspected wedge resection clips in the left upper lung medially. Airway thickening is present, suggesting bronchitis or reactive airways disease. Hazy interstitial accentuation bilaterally, mildly worsened from 07/23/2021, cannot exclude mild atypical pneumonia. No cardiomegaly to further suggest acute pulmonary edema. No blunting of the costophrenic angles. IMPRESSION: 1. Hazy interstitial accentuation bilaterally, mildly worsened from 07/23/2021, cannot exclude mild atypical pneumonia. 2. Airway thickening is present, suggesting bronchitis or reactive airways disease. 3. Bony demineralization. 4.  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 Electronically Signed   By: Van Clines M.D.   On: 01/17/2022 21:28   DG Chest Port 1 View  Result Date: 12/23/2021 CLINICAL DATA:  Fever. EXAM: PORTABLE CHEST 1 VIEW COMPARISON:  Chest x-ray 12/21/2021 FINDINGS: Lung volumes are low likely accentuating central pulmonary vascularity. There is no focal lung infiltrate, pleural effusion or pneumothorax. The cardiomediastinal silhouette is within normal limits. No acute fractures are seen. There surgical clips in the right abdomen. There are multiple healed left-sided rib fractures. IMPRESSION: No active disease. Electronically Signed   By: Ronney Asters M.D.   On: 12/23/2021 23:38   CT ABDOMEN PELVIS W CONTRAST  Result Date: 12/21/2021 CLINICAL DATA:  Nausea and vomiting. EXAM: CT ABDOMEN AND PELVIS WITH CONTRAST TECHNIQUE: Multidetector CT imaging of the abdomen and pelvis was performed using the standard protocol following bolus administration of intravenous contrast. RADIATION DOSE REDUCTION: This exam was performed according to the departmental dose-optimization program which includes automated exposure control, adjustment of the mA and/or kV according to patient size and/or use of iterative reconstruction technique. CONTRAST:  48mL OMNIPAQUE IOHEXOL 300 MG/ML  SOLN COMPARISON:  CT abdomen and pelvis 06/16/2020 FINDINGS: Lower chest: There is atelectasis in the right lung base. Hepatobiliary: Gallbladder surgically absent. Mild prominence of the biliary system is similar to prior. No focal liver lesions are seen. Pancreas: No pancreatic ductal dilatation or surrounding inflammatory changes. Rounded density with punctate calcification seen in the tail the pancreas measuring 16 mm image 2/30. This is unchanged. Spleen: Normal in size without focal abnormality. Calcified granuloma is present. Adrenals/Urinary Tract: The kidneys and adrenal glands are within normal limits. There is mild diffuse  bladder wall thickening, unchanged. Stomach/Bowel: There is wall thickening and surrounding inflammatory stranding of the descending colon to the level the rectum. No bowel obstruction, pneumatosis or free air. The appendix is not seen. Stomach and small bowel are within normal limits. Vascular/Lymphatic: Aortic atherosclerosis. No enlarged abdominal or pelvic lymph nodes. Reproductive: Prostate gland is enlarged, unchanged. Other: No abdominal wall hernia or abnormality. No abdominopelvic ascites. Musculoskeletal: Degenerative changes affect the spine and hips. IMPRESSION: 1. Wall thickening and inflammation of the descending colon to the level of the rectum compatible with proctocolitis. 2. Stable prostatomegaly with chronic bladder outlet obstruction. 3. Rounded lesion in the pancreatic tail measures 16 mm, indeterminate. Recommend further evaluation with MRI of the pancreas. 4.  Aortic Atherosclerosis (ICD10-I70.0). Electronically Signed   By: Ronney Asters M.D.   On: 12/21/2021 21:28   DG Chest Port 1 View  Result Date: 12/21/2021 CLINICAL DATA:  Fever EXAM: PORTABLE CHEST 1 VIEW COMPARISON:  07/10/2019 FINDINGS: Lung volumes are small and pulmonary insufflation has diminished since prior examination. Minimal left basilar atelectasis or scarring is present. Probable chronic underlying interstitial changes. No pneumothorax or pleural effusion. Cardiac size within normal limits. Pulmonary vascularity  is normal. No acute bone abnormality. Multiple remote left rib fractures are noted. IMPRESSION: Pulmonary hypoinflation. Electronically Signed   By: Fidela Salisbury M.D.   On: 12/21/2021 21:02    Microbiology: Results for orders placed or performed during the hospital encounter of 01/17/22  Resp Panel by RT-PCR (Flu A&B, Covid) Anterior Nasal Swab     Status: None   Collection Time: 01/17/22  9:03 PM   Specimen: Anterior Nasal Swab  Result Value Ref Range Status   SARS Coronavirus 2 by RT PCR NEGATIVE  NEGATIVE Final    Comment: (NOTE) SARS-CoV-2 target nucleic acids are NOT DETECTED.  The SARS-CoV-2 RNA is generally detectable in upper respiratory specimens during the acute phase of infection. The lowest concentration of SARS-CoV-2 viral copies this assay can detect is 138 copies/mL. A negative result does not preclude SARS-Cov-2 infection and should not be used as the sole basis for treatment or other patient management decisions. A negative result may occur with  improper specimen collection/handling, submission of specimen other than nasopharyngeal swab, presence of viral mutation(s) within the areas targeted by this assay, and inadequate number of viral copies(<138 copies/mL). A negative result must be combined with clinical observations, patient history, and epidemiological information. The expected result is Negative.  Fact Sheet for Patients:  EntrepreneurPulse.com.au  Fact Sheet for Healthcare Providers:  IncredibleEmployment.be  This test is no t yet approved or cleared by the [place] FDA and  has been authorized for detection and/or diagnosis of SARS-CoV-2 by FDA under an Emergency Use Authorization (EUA). This EUA will remain  in effect (meaning this test can be used) for the duration of the COVID-19 declaration under Section 564(b)(1) of the Act, 21 U.S.C.section 360bbb-3(b)(1), unless the authorization is terminated  or revoked sooner.       Influenza A by PCR NEGATIVE NEGATIVE Final   Influenza B by PCR NEGATIVE NEGATIVE Final    Comment: (NOTE) The Xpert Xpress SARS-CoV-2/FLU/RSV plus assay is intended as an aid in the diagnosis of influenza from Nasopharyngeal swab specimens and should not be used as a sole basis for treatment. Nasal washings and aspirates are unacceptable for Xpert Xpress SARS-CoV-2/FLU/RSV testing.  Fact Sheet for Patients: EntrepreneurPulse.com.au  Fact Sheet for Healthcare  Providers: IncredibleEmployment.be  This test is not yet approved or cleared by the [place] FDA and has been authorized for detection and/or diagnosis of SARS-CoV-2 by FDA under an Emergency Use Authorization (EUA). This EUA will remain in effect (meaning this test can be used) for the duration of the COVID-19 declaration under Section 564(b)(1) of the Act, 21 U.S.C. section 360bbb-3(b)(1), unless the authorization is terminated or revoked.  Performed at [facility], [address] [employer], [place] [address]   Blood Culture (routine x 2)     Status: Abnormal (Preliminary result)   Collection Time: 01/17/22  9:03 PM   Specimen: BLOOD  Result Value Ref Range Status   Specimen Description   Final    BLOOD RIGHT ANTECUBITAL Performed at [facility], [address], [place], [place] [address]    Special Requests   Final    BOTTLES DRAWN AEROBIC AND ANAEROBIC Blood Culture adequate volume Performed at [facility], [address], [place], [place] [address]    Culture  Setup Time   Final    GRAM POSITIVE COCCI IN BOTH AEROBIC AND ANAEROBIC BOTTLES CRITICAL VALUE NOTED.  VALUE IS CONSISTENT WITH PREVIOUSLY REPORTED AND CALLED VALUE. Performed at [facility], [address] [place], [place] [address]    Culture STAPHYLOCOCCUS CAPITIS (A)  Final   Report Status PENDING  Incomplete  Blood Culture (routine x 2)     Status: Abnormal (Preliminary result)   Collection Time: 01/17/22  9:03 PM   Specimen: BLOOD  Result Value Ref Range Status   Specimen Description   Final    BLOOD LEFT ANTECUBITAL Performed at [facility], [address], [place], [place] [address]    Special Requests   Final    BOTTLES DRAWN AEROBIC AND ANAEROBIC Blood Culture adequate volume Performed at [facility], [address] [place], [place] [address]    Culture  Setup Time   Final    GRAM POSITIVE COCCI IN BOTH  AEROBIC AND ANAEROBIC BOTTLES CRITICAL RESULT CALLED TO, READ BACK BY AND VERIFIED WITH: RAQUEL RODRIQUEZ 01/18/22 1403 KLW Performed at [facility], [place] [address], [place], [place] [address]    Culture STAPHYLOCOCCUS EPIDERMIDIS (A)  Final   Report Status PENDING  Incomplete  Blood Culture ID Panel (Reflexed)     Status: Abnormal   Collection Time: 01/17/22  9:03 PM  Result Value Ref Range Status   Enterococcus faecalis NOT DETECTED NOT DETECTED Final   Enterococcus Faecium NOT DETECTED NOT DETECTED Final   Listeria monocytogenes NOT DETECTED NOT DETECTED Final   Staphylococcus species DETECTED (A) NOT DETECTED Final    Comment: CRITICAL RESULT CALLED TO, READ BACK BY AND VERIFIED WITH: RAQUEL RODRIGUEZ 01/18/22 1403 KLW    Staphylococcus aureus (BCID) NOT DETECTED NOT DETECTED Final   Staphylococcus epidermidis DETECTED (A) NOT DETECTED Final    Comment: Methicillin (oxacillin) resistant coagulase negative staphylococcus. Possible blood culture contaminant (unless isolated from more than one blood culture draw or clinical case suggests pathogenicity). No antibiotic treatment is indicated for blood  culture contaminants. CRITICAL RESULT CALLED TO, READ BACK BY AND VERIFIED WITH: RAQUEL RODRIGUEZ 01/18/22 1403 KLW    Staphylococcus lugdunensis NOT DETECTED NOT DETECTED Final   Streptococcus species NOT DETECTED NOT DETECTED Final   Streptococcus agalactiae NOT DETECTED NOT DETECTED Final   Streptococcus pneumoniae NOT DETECTED NOT DETECTED Final   Streptococcus pyogenes NOT DETECTED NOT DETECTED Final   A.calcoaceticus-baumannii NOT DETECTED NOT DETECTED Final   Bacteroides fragilis NOT DETECTED NOT DETECTED Final   Enterobacterales NOT DETECTED NOT DETECTED Final   Enterobacter cloacae complex NOT DETECTED NOT DETECTED Final   Escherichia coli NOT DETECTED NOT DETECTED Final   Klebsiella aerogenes NOT DETECTED NOT DETECTED Final   Klebsiella oxytoca NOT DETECTED NOT  DETECTED Final   Klebsiella pneumoniae NOT DETECTED NOT DETECTED Final   Proteus species NOT DETECTED NOT DETECTED Final   Salmonella species NOT DETECTED NOT DETECTED Final   Serratia marcescens NOT DETECTED NOT DETECTED Final   Haemophilus influenzae NOT DETECTED NOT DETECTED Final   Neisseria meningitidis NOT DETECTED NOT DETECTED Final   Pseudomonas aeruginosa NOT DETECTED NOT DETECTED Final   Stenotrophomonas maltophilia NOT DETECTED NOT DETECTED Final   Candida albicans NOT DETECTED NOT DETECTED Final   Candida auris NOT DETECTED NOT DETECTED Final   Candida glabrata NOT DETECTED NOT DETECTED Final   Candida krusei NOT DETECTED NOT DETECTED Final   Candida parapsilosis NOT DETECTED NOT DETECTED Final   Candida tropicalis NOT DETECTED NOT DETECTED Final   Cryptococcus neoformans/gattii NOT DETECTED NOT DETECTED Final   Methicillin resistance mecA/C DETECTED (A) NOT DETECTED Final    Comment: CRITICAL RESULT CALLED TO, READ BACK BY AND VERIFIED WITH: RAQUEL RODRIGUEZ  01/18/22 [place] [facility], [address] [place], [place] [address]   Urine Culture     Status: Abnormal (Preliminary result)   Collection Time: 01/18/22  4:16 AM   Specimen: In/Out Cath Urine  Result Value Ref Range Status   Specimen Description   Final    IN/OUT CATH URINE Performed at [facility], [address], [place], [place] [address]    Special Requests   Final    NONE Performed at [facility], [address], [place], [place] [address]    Culture 20,000 COLONIES/mL ENTEROCOCCUS FAECALIS (A)  Final   Report Status PENDING  Incomplete  MRSA Next Gen by PCR, Nasal     Status: Abnormal   Collection Time: 01/18/22 10:51 AM   Specimen: Nasal Mucosa; Nasal Swab  Result Value Ref Range Status   MRSA by PCR Next Gen DETECTED (A) NOT DETECTED Final    Comment: RESULT CALLED TO, READ BACK BY AND VERIFIED WITH: NAKIA HILL AT 3762 ON 01/18/22 BY SS (NOTE) The  GeneXpert MRSA Assay (FDA approved for NASAL specimens only), is one component of a comprehensive MRSA colonization surveillance program. It is not intended to diagnose MRSA infection nor to guide or monitor treatment for MRSA infections. Test performance is not FDA approved in patients less than 31 years old. Performed at [facility], [address] [place], [place] [address]     Labs: CBC: Recent Labs  Lab 01/17/22 2116 01/18/22 0445 01/19/22 0508  WBC 20.1* 19.4* 11.5*  NEUTROABS 16.9*  --   --   HGB 10.5* 10.2* 8.6*  HCT 33.3* 33.2* 27.3*  MCV 98.2 96.8 96.1  PLT 164 168 761*   Basic Metabolic Panel: Recent Labs  Lab 01/17/22 2116 01/18/22 0445 01/19/22 0508  NA 139 140 139  K 3.9 3.9 3.2*  CL 110 108 109  CO2 25 26 25   GLUCOSE 126* 116* 138*  BUN 31* 25* 20  CREATININE 0.99 0.84 0.71  CALCIUM 8.4* 8.6* 8.6*  MG  --   --  1.7   Liver Function Tests: Recent Labs  Lab 01/17/22 2116 01/18/22 0445  AST 24 22  ALT 16 15  ALKPHOS 41 40  BILITOT 0.4 0.9  PROT 5.7* 5.6*  ALBUMIN 2.5* 2.4*     Discharge time spent: greater than 30 minutes.  Signed: Loletha Grayer, MD [facility] 01/19/2022

## 2022-01-19 NOTE — Assessment & Plan Note (Signed)
Clinical sepsis, present on admission secondary to infected decubitus ulcer and possible pneumonia.  Patient on vancomycin and cefepime.  Staphylococcus epidermidis and Staphylococcus capitis growing out of blood culture bottles.  Would recommend repeat blood cultures tomorrow.

## 2022-01-19 NOTE — Assessment & Plan Note (Signed)
Continue Protonix °

## 2022-01-19 NOTE — Anesthesia Procedure Notes (Signed)
Procedure Name: Intubation Date/Time: 01/19/2022 10:39 AM  Performed by: Debe Coder, CRNAPre-anesthesia Checklist: Patient identified, Emergency Drugs available, Suction available and Patient being monitored Patient Re-evaluated:Patient Re-evaluated prior to induction Oxygen Delivery Method: Circle system utilized Preoxygenation: Pre-oxygenation with 100% oxygen Induction Type: IV induction Ventilation: Mask ventilation without difficulty Laryngoscope Size: McGraph and 3 Grade View: Grade I Tube type: Oral Tube size: 6.5 mm Number of attempts: 1 Airway Equipment and Method: Stylet and Oral airway Placement Confirmation: ETT inserted through vocal cords under direct vision, positive ETCO2 and breath sounds checked- equal and bilateral Secured at: 21 cm Tube secured with: Tape Dental Injury: Teeth and Oropharynx as per pre-operative assessment

## 2022-01-19 NOTE — Progress Notes (Signed)
       CROSS COVER NOTE  NAME: Timothy Noble MRN: [id number] DOB : 1931-02-17 ATTENDING PHYSICIAN: Sharen Hones, MD    Date of Service   01/19/2022   HPI/Events of Note   Notified of elevated Temp 32.8C  Interventions   Assessment/Plan:  Staph Epi Bacteremia Sacral Wound Cellulitis  Tylenol x1 Continue Cefepime and Vancomycin Follow fever curve     This document was prepared using Dragon voice recognition software and may include unintentional dictation errors.  Neomia Glass DNP, MBA, FNP-BC Nurse Practitioner [facility] Pager [phone]

## 2022-01-19 NOTE — Evaluation (Addendum)
Clinical/Bedside Swallow Evaluation Patient Details  Name: Yaphet Smethurst MRN: [id number] Date of Birth: [birth year]  Today's Date: 01/18/2022 Time: SLP Start Time (ACUTE ONLY): 36 SLP Stop Time (ACUTE ONLY): 77 SLP Time Calculation (min) (ACUTE ONLY): 60 min  Past Medical History:  Past Medical History:  Diagnosis Date   Cancer ([place])    Dementia ([place])    GERD (gastroesophageal reflux disease)    Lung cancer ([place])    Past Surgical History:  Past Surgical History:  Procedure Laterality Date   CHOLECYSTECTOMY     LUNG SURGERY Left    rotator cuff surgery     HPI:  Pt  is a 88 y.o. male with medical history significant of Advanced Dementia, prior history of lung cancer, GERD, stage III decubitus Ulcer, peripheral neuropathy, BPH, hard of hearing who is here with fever chills and worsening mental status from the skilled nursing facility.  Patient is unable to give any history.  Reportedly had a temperature of 101 in route.  He rectal temperature was 100.5, has a white count of 20.1 and admits sepsis criteria with normal lactic acid but clinically severely septic most likely secondary to infected decubitus ulcer with chest x-ray and CT abdomen pelvis showing right basilar pneumonia.  Patient being admitted for treatment of sepsis as a result of these decubitus ulcer and pneumonia.  He is Dependent for all ADLs, mech lift for transfers.  Has eaten a mech soft diet in the past but a more Pureed diet per chart notes last admit.   CXR: Hazy interstitial accentuation bilaterally, mildly worsened from  07/23/2021, cannot exclude mild atypical pneumonia.  2. Airway thickening is present, suggesting bronchitis or reactive  airways disease.  3. Bony demineralization.  4. 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    Assessment / Plan / Recommendation  Clinical Impression   Pt seen for BSE. He was recently evaluated on 11/2021 and recommended to remain on his Baseline diet of Pureed foods w/  thin liquids; Caregiver feeding support at his Facility at Baseline also. He is dependent for all ADLs at his [place]. Minimal few words to direct question this session. Son present in the room and concerned about the amount of "Psych meds causing this" -- encouraged him to talk w/ the MD or NSG about this.   Pt awake, eyes open. Responded to direct questions w/ few words; delayed responses baseline for him. He responded to straw/utensil to lips.  On [place] O2 support, 2L. Afebrile.    Pt appears to present w/ adequate pharyngeal phase swallow function w/ a Modified diet consistency(baseline for pt) but w/ mild Oral Prep deficits (oral acceptance of po's) in setting of declined Cognitive status; Baseline Dementia. ANY Cognitive decline can impact overall awareness/timing of swallow and safety during po tasks which increases risk for aspiration, choking. Can also impact oral prep acceptance coordination of oral intake in general.  Pt's risk for aspiration can be reduced when following general aspiration precautions, supporting him at meals w/ encouragement and cues, and using a modified diet consistency of Puree foods for ease of oral intake overall -- this has been his recent diet at the nursing home.       Pt consumed several trials of thin liquids via straw, and purees w/ No overt clinical s/s of aspiration noted: no decline in respiratory status during/post trials, no cough. Oral phase was adequate for bolus management and oral clearing of the boluses given. Time given during the Oral Prep stage  to recognize and accept the food trials, then during the oral phase for A-P transfer and oral clearing. MOD+ verbal/visual/tactile cues given during this session to support accepting of intake of po trials.  OM Exam was cursory d/t pt's Cognitive decline but appeared [facility] w/ No unilateral lingual weakness noted during bolus management and oral clearing.    In setting of baseline Dementia and Cognitive  decline, and risk for choking/aspiration, recommend a more dysphagia level 1(moistened for ease of oral phase) w/ thin liquids; MOD Cues to initiate oral awareness and intake, general aspiration precautions. Reduce Distractions during meals and engage pt during meals for self-feeding if able -- holding cup. Pills Crushed in Puree for safer swallowing. Support w/ feeding at meals. Supervision to encourage oral intake safely. MD/NSG updated.  ST services recommends follow w/ Palliative Care for [place] and education re: impact of Cognitive decline/Dementia on swallowing and overall intake moving forward -- nutrition/hydration needs. Suspect pt is close to/at his baseline. Precautions posted in room. Education completed w/ NSG, MD.  No further skilled ST services indicated.  SLP Visit Diagnosis: Dysphagia, oral phase (R13.11) (baseline status; Dementia. on a Puree diet. Full assist w/ feeding baseline.)    Aspiration Risk  Mild aspiration risk;Risk for inadequate nutrition/hydration (reduced following precautions; feeding)    Diet Recommendation   dysphagia level 1(moistened for ease of oral phase) w/ thin liquids; MOD Cues to initiate oral awareness and intake, general aspiration precautions. Reduce Distractions during meals and engage pt during meals for self-feeding if able -- holding cup. Support w/ feeding at meals. Supervision to encourage oral intake safely.   Medication Administration: Crushed with puree    Other  Recommendations Recommended Consults:  (Dietician f/u; Palliative Care f/u for GOC overall) Oral Care Recommendations: Oral care BID;Oral care before and after PO;Staff/trained caregiver to provide oral care Other Recommendations:  (n/a)    Recommendations for follow up therapy are one component of a multi-disciplinary discharge planning process, led by the attending physician.  Recommendations may be updated based on patient status, additional functional criteria and insurance  authorization.  Follow up Recommendations No SLP follow up      Assistance Recommended at Discharge Frequent or constant Supervision/Assistance (for feeding; baseline)  Functional Status Assessment Patient has not had a recent decline in their functional status  Frequency and Duration  (n/a)   (n/a)       Prognosis Prognosis for Safe Diet Advancement: Fair Barriers to Reach Goals: Cognitive deficits;Language deficits;Time post onset;Severity of deficits;Behavior Barriers/Prognosis Comment: baseline status; Dementia. on a Puree diet. Full assist w/ feeding baseline.      Swallow Study   General Date of Onset: 01/17/22 HPI: Pt  is a 88 y.o. male with medical history significant of Advanced Dementia, prior history of lung cancer, GERD, stage III decubitus Ulcer, peripheral neuropathy, BPH, hard of hearing who is here with fever chills and worsening mental status from the skilled nursing facility.  Patient is unable to give any history.  Reportedly had a temperature of 101 in route.  He rectal temperature was 100.5, has a white count of 20.1 and admits sepsis criteria with normal lactic acid but clinically severely septic most likely secondary to infected decubitus ulcer with chest x-ray and CT abdomen pelvis showing right basilar pneumonia.  Patient being admitted for treatment of sepsis as a result of these decubitus ulcer and pneumonia.  He is Dependent for all ADLs, mech lift for transfers.  Has eaten a mech soft diet in the  past but a more Pureed diet per chart notes last admit.   CXR: Hazy interstitial accentuation bilaterally, mildly worsened from  07/23/2021, cannot exclude mild atypical pneumonia.  2. Airway thickening is present, suggesting bronchitis or reactive  airways disease.  3. Bony demineralization.  4. 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 Type of Study: Bedside Swallow Evaluation Previous Swallow Assessment: 11/2021 Diet Prior to this Study: Dysphagia 1  (puree);Thin liquids (w/ feeding support) Temperature Spikes Noted: No (wbc declining from admit) Respiratory Status: Nasal cannula (2L) History of Recent Intubation: No Behavior/Cognition: Alert;Cooperative;Pleasant mood;Confused;Distractible;Requires cueing (baseline Dementia) Oral Cavity Assessment: Dry Oral Care Completed by SLP: Yes Oral Cavity - Dentition: Missing dentition Vision:  (n/a) Self-Feeding Abilities: Total assist Patient Positioning: Upright in bed (needed full positioning support) Baseline Vocal Quality: Low vocal intensity (muttered speech somewhat) Volitional Cough: Cognitively unable to elicit Volitional Swallow: Unable to elicit    Oral/Motor/Sensory Function Overall Oral Motor/Sensory Function: Within functional limits (generalized weakness)   Ice Chips Ice chips: Within functional limits Presentation: Spoon (3 trials) Other Comments: adequate oral phase management   Thin Liquid Thin Liquid: Within functional limits (adequate) Presentation: Straw (fed and pinched straw if needed; 10+ trials)    Nectar Thick Nectar Thick Liquid: Not tested   Honey Thick Honey Thick Liquid: Not tested   Puree Puree: Within functional limits Presentation: Spoon (fed; 10 trials) Other Comments: he accepted only small bites   Solid     Solid: Not tested Other Comments: baseline - on puree         Orinda Kenner, MS, CCC-SLP Speech Language Pathologist Rehab Services; [place] [phone] (ascom) [place]s 01/19/2022, PM (Addendum)

## 2022-01-19 NOTE — Consult Note (Signed)
Pharmacy Antibiotic Note  Timothy Noble is a 88 y.o. male admitted on 01/17/2022 with  fevers and chills along with worsening mental status .  Had rectal temperature of 100.5, and white count of 20.1, meeting sepsis criteria. Patient also has what appears to be an infected decubitus ulcer with chest x-ray showing right atypical pneumonia. Pharmacy has been consulted for Vancomycin and Cefepime dosing.  Plan: Increase Vancomycin 750mg >>1000mg  IV Q24 hrs with improved renal fxn and c/f GP bacteremia Goal AUC 400-600. Expected AUC: 521.7 Expected Cmin: 12.4 SCr used: 0.99>0.71 (rounded to 0.8), Vd used: 0.72  Continues Cefepime 2g IV Q12 hours   Height: 5\' 3"  (160 cm) Weight: 57.6 kg (126 lb 15.8 oz) IBW/kg (Calculated) : 56.9  Temp (24hrs), Avg:99.6 F (37.6 C), Min:98.8 F (37.1 C), Max:100.8 F (38.2 C)  Recent Labs  Lab 01/17/22 2103 01/17/22 2116 01/18/22 0445 01/19/22 0508  WBC  --  20.1* 19.4* 11.5*  CREATININE  --  0.99 0.84 0.71  LATICACIDVEN 1.3  --   --   --      Estimated Creatinine Clearance: 50.4 mL/min (by C-G formula based on SCr of 0.71 mg/dL).    Allergies  Allergen Reactions   Alendronate Sodium    Terazosin     Other reaction(s): Dizziness   Oxycodone     Other reaction(s): Delirium    Antimicrobials this admission: +VAN/CFP/Azith x1 (10/23) +VAN/CFP (10/24>>  Dose adjustments this admission: CTM and adjust PRN; 10/25 Scr 0.99>0.71 adjustment made.  Microbiology results: 10/23 COV/FLU - negative 10/23 Bcx - GPCs in 3of4 (pending BCID) showing MRSE 10/24 MRSA PCR - Positive 10/24 Ucx - sent   Thank you for allowing pharmacy to be a part of this patient's care.  Shanon Brow Amandamarie Feggins 01/19/2022 8:29 AM

## 2022-01-19 NOTE — Progress Notes (Addendum)
Received word there is a bed ready at [facility]. There is no transport available tonight. Going to unit [address]; [phone] ext [phone].  Attempted report x3; no answer on unit or with operator.   Report given to [facility]; use ext. [phone] for call when transport is available. 01/19/22 1:15 AM

## 2022-01-19 NOTE — Transfer of Care (Signed)
Immediate Anesthesia Transfer of Care Note  Patient: Timothy Noble  Procedure(s) Performed: DEBRIDMENT OF DECUBITUS ULCER  Patient Location: PACU  Anesthesia Type:General  Level of Consciousness: awake and alert   Airway & Oxygen Therapy: Patient Spontanous Breathing and Patient connected to face mask oxygen  Post-op Assessment: Report given to RN and Post -op Vital signs reviewed and stable  Post vital signs: Reviewed and stable  Last Vitals:  Vitals Value Taken Time  BP 135/58 01/19/22 1152  Temp    Pulse 80 01/19/22 1153  Resp 13 01/19/22 1153  SpO2 100 % 01/19/22 1153  Vitals shown include unvalidated device data.  Last Pain:  Vitals:   01/19/22 0926  TempSrc: Temporal  PainSc: 4          Complications: No notable events documented.

## 2022-01-20 ENCOUNTER — Encounter: Payer: Self-pay | Admitting: Surgery

## 2022-01-20 LAB — URINE CULTURE: Culture: 20000 — AB

## 2022-01-20 NOTE — Anesthesia Postprocedure Evaluation (Addendum)
Anesthesia Post Note  Patient: Timothy Noble  Procedure(s) Performed: DEBRIDMENT OF DECUBITUS ULCER  Patient location during evaluation: PACU Anesthesia Type: General Level of consciousness: awake and alert Pain management: pain level controlled Vital Signs Assessment: post-procedure vital signs reviewed and stable Respiratory status: spontaneous breathing, nonlabored ventilation, respiratory function stable and patient connected to nasal cannula oxygen Cardiovascular status: blood pressure returned to baseline and stable Postop Assessment: no apparent nausea or vomiting Anesthetic complications: no   No notable events documented.   Last Vitals:  Vitals:   01/19/22 1245 01/19/22 1632  BP: (!) 147/78 (!) 140/74  Pulse: 87 90  Resp: 18 20  Temp:  36.4 C  SpO2: 96% 99%    Last Pain:  Vitals:   01/19/22 1632  TempSrc: Axillary  PainSc:                  Dimas Millin

## 2022-01-22 LAB — CULTURE, BLOOD (ROUTINE X 2)
Special Requests: ADEQUATE
Special Requests: ADEQUATE

## 2022-01-24 LAB — AEROBIC/ANAEROBIC CULTURE W GRAM STAIN (SURGICAL/DEEP WOUND): Gram Stain: NONE SEEN

## 2022-03-28 DEATH — deceased

## 2022-10-12 IMAGING — CT CT ABD-PELV W/O CM
1 of 2 series · 15 of 32 positions shown, 19 images · non-contrast
Comparison: None.

CLINICAL DATA: Abdominal pain and cramping beginning yesterday.

EXAM:
CT ABDOMEN AND PELVIS WITHOUT CONTRAST
TECHNIQUE: Multidetector CT imaging of the abdomen and pelvis was performed
following the standard protocol without IV contrast.

[Series 2: axial st · axial · 0.80mm/px · z∈[-1078,-633]mm · 15 of 99 slices shown, 19 images]
[im 5/99  soft-tissue]
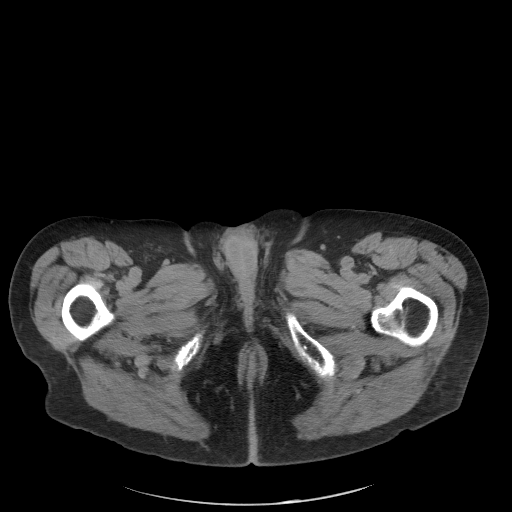
[im 5/99  bone]
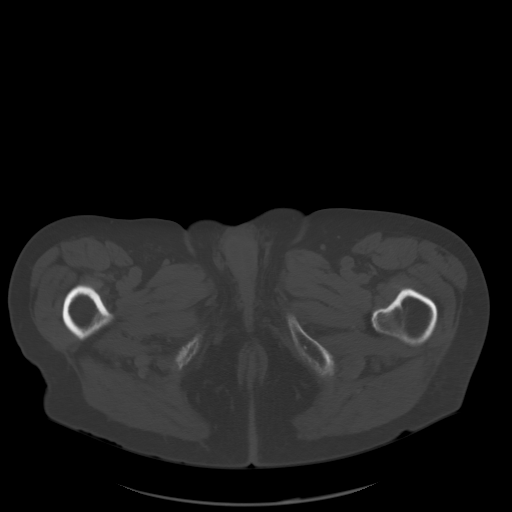
[im 13/99  soft-tissue]
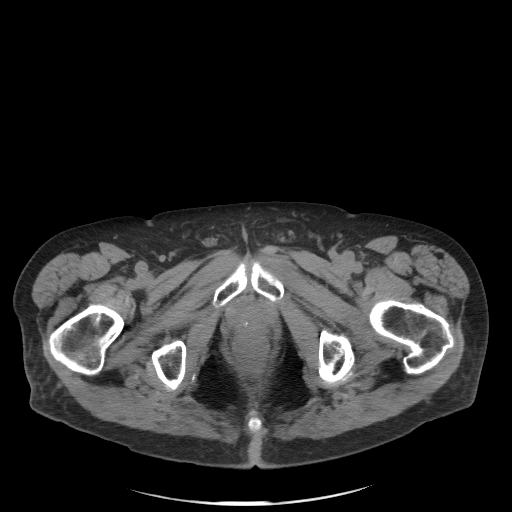
[im 21/99  soft-tissue]
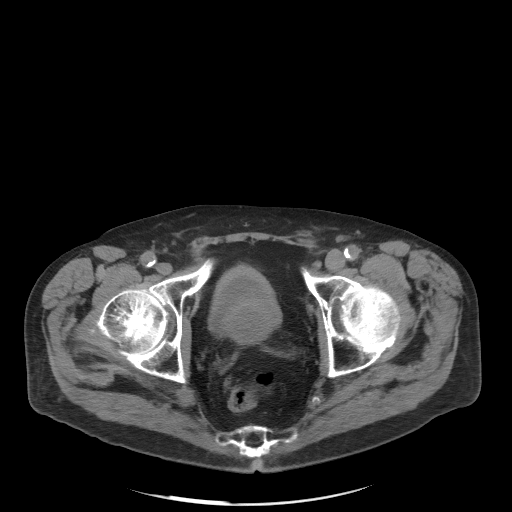
[im 29/99  soft-tissue]
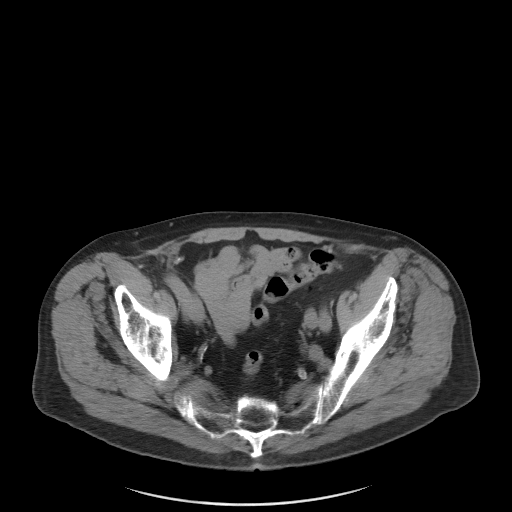
[im 33/99  soft-tissue]
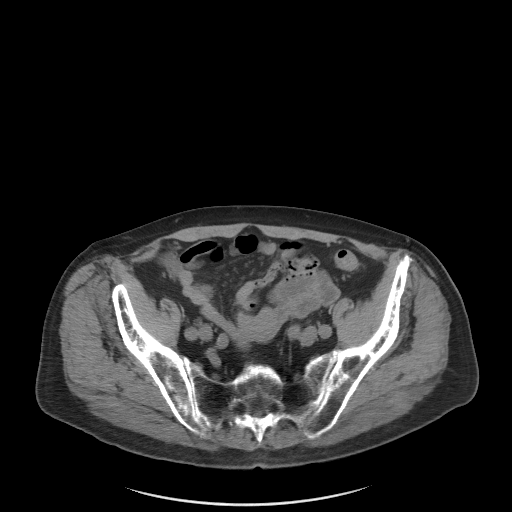
[im 41/99  soft-tissue]
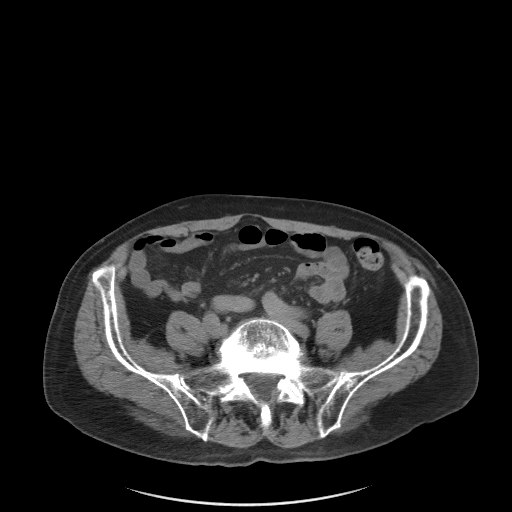
[im 50/99  soft-tissue]
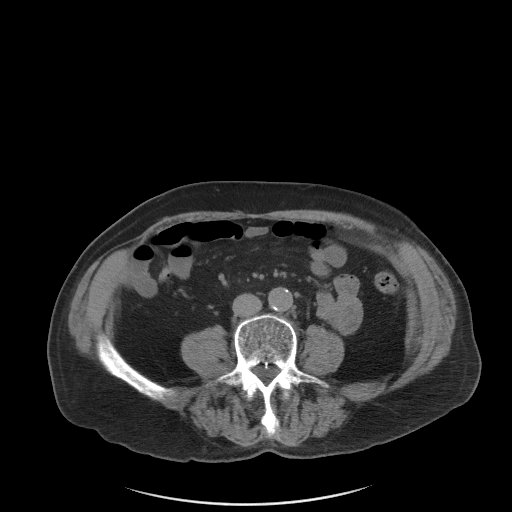
[im 58/99  soft-tissue]
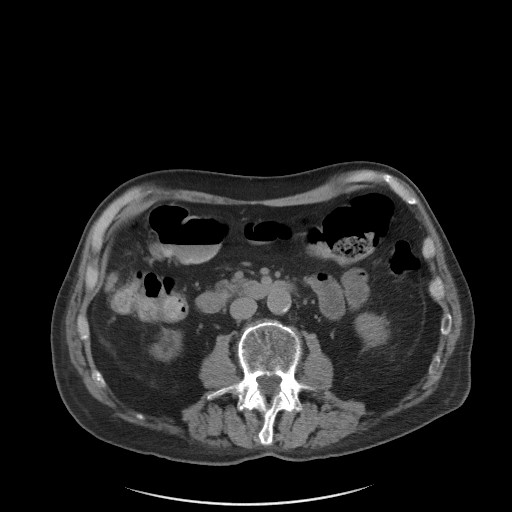
[im 66/99  soft-tissue]
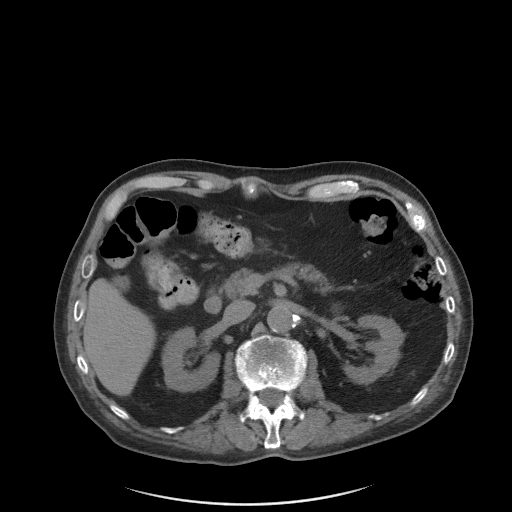
[im 66/99  bone]
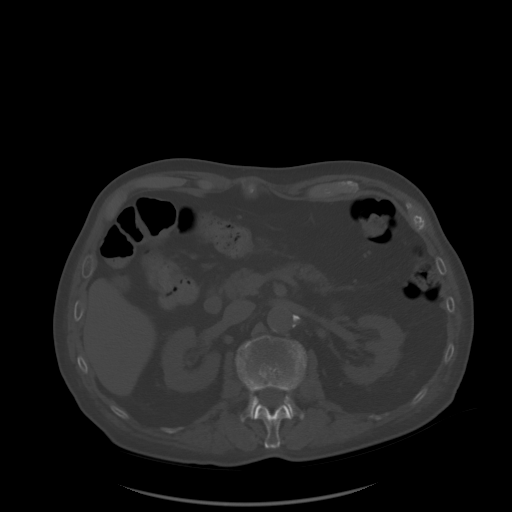
[im 70/99  soft-tissue]
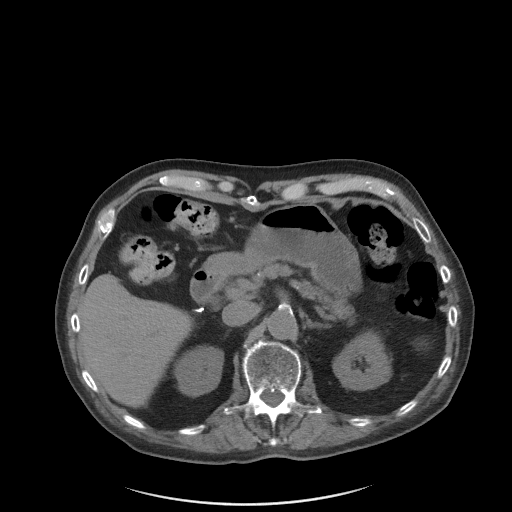
[im 78/99  soft-tissue]
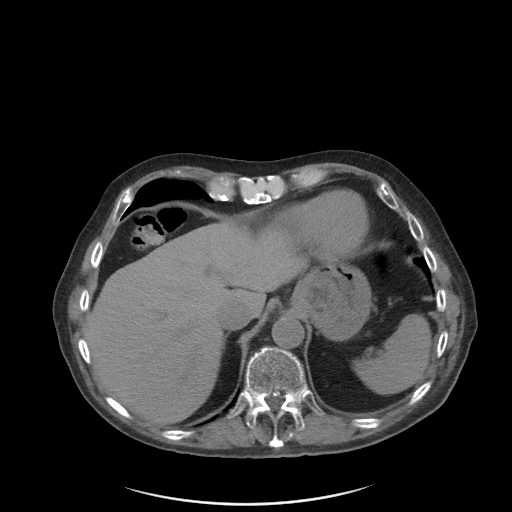
[im 82/99  lung]
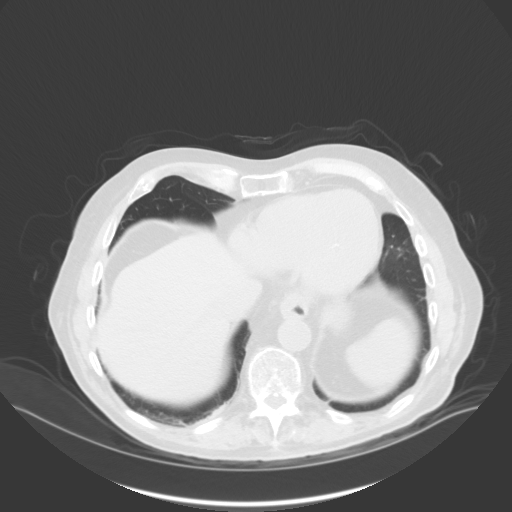
[im 86/99  soft-tissue]
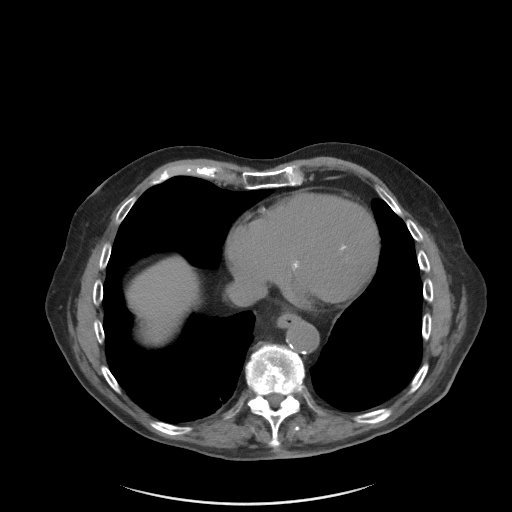
[im 86/99  lung]
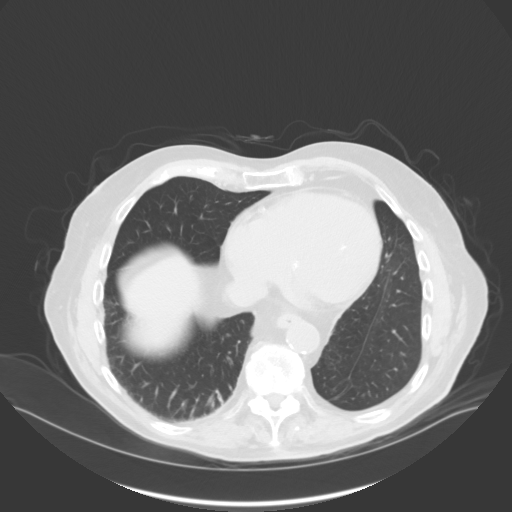
[im 90/99  lung]
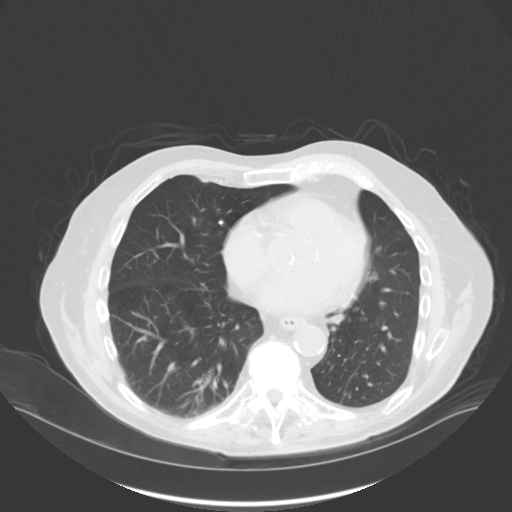
[im 94/99  soft-tissue]
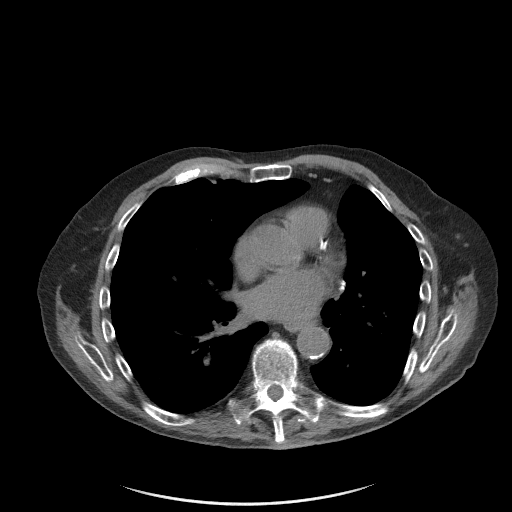
[im 94/99  lung]
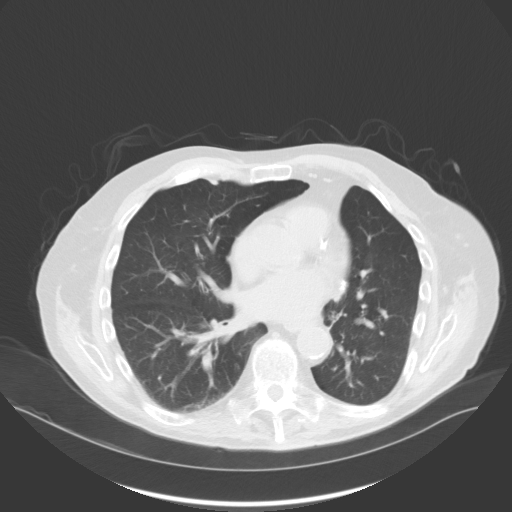

[15 of 32 positions shown; findings below may reference images not displayed]

FINDINGS: Lower chest: No acute findings.

Hepatobiliary: No mass visualized on this unenhanced exam. Prior
cholecystectomy. No evidence of biliary obstruction.

Pancreas: No mass or inflammatory process visualized on this
unenhanced exam.

Spleen:  Within normal limits in size.

Adrenals/Urinary tract: No evidence of urolithiasis or
hydronephrosis. Diffuse bladder wall thickening is seen, likely due
to chronic bladder outlet obstruction given enlarged prostate.

Stomach/Bowel: No evidence of obstruction, inflammatory process, or
abnormal fluid collections. Diverticulosis is seen mainly involving
the sigmoid colon, however there is no evidence of diverticulitis.

Vascular/Lymphatic: No pathologically enlarged lymph nodes
identified. No evidence of abdominal aortic aneurysm. Aortic
atherosclerotic calcification noted.

Reproductive:  Moderately enlarged prostate.

Other:  None.

Musculoskeletal:  No suspicious bone lesions identified.
IMPRESSION: No evidence of urolithiasis, hydronephrosis, or other acute
findings.

Moderately enlarged prostate and findings of chronic bladder outlet
obstruction.

Sigmoid diverticulosis, without radiographic evidence of
diverticulitis.

Aortic Atherosclerosis ([id number]).

## 2023-12-30 IMAGING — CT CT HEAD W/O CM
4 of 5 series · 16 of 47 positions shown, 18 images · non-contrast
Comparison: None Available.

CLINICAL DATA: Mental status change, unknown cause. Aggressive
behavior. History of dementia.



[Series 3: head wo · axial · 0.39mm/px · z∈[-110,-10]mm · 5 of 31 slices shown, 7 images (1 of 2)]
[im 6/31  brain]
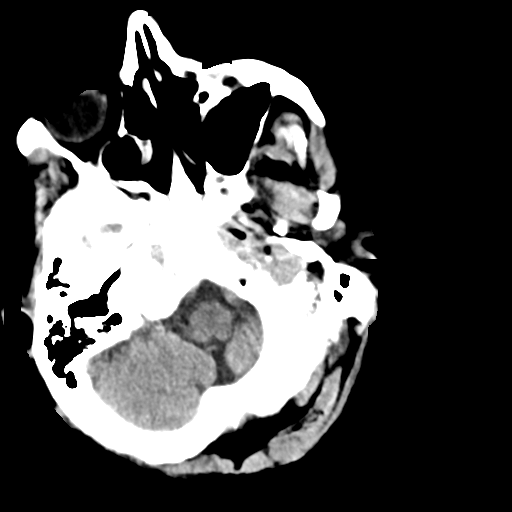
[im 6/31  bone]
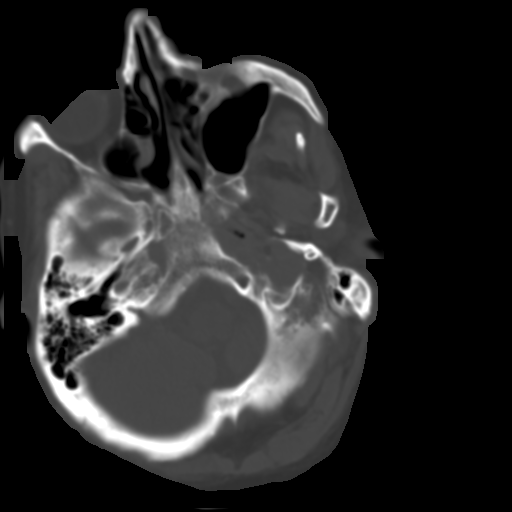
[im 11/31  brain]
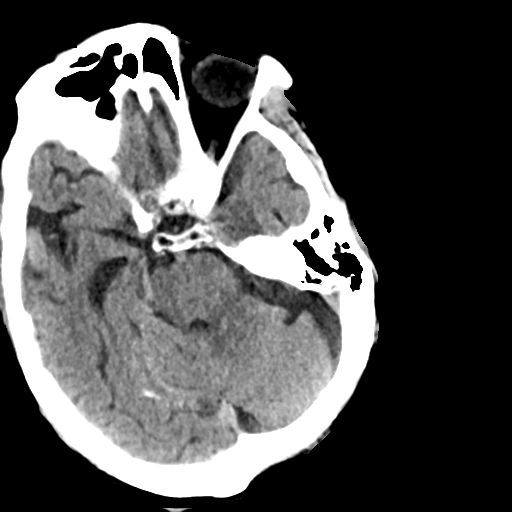
[im 16/31  brain]
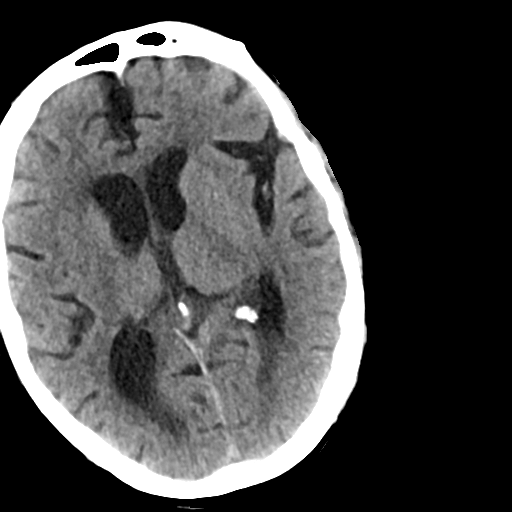
[im 21/31  brain]
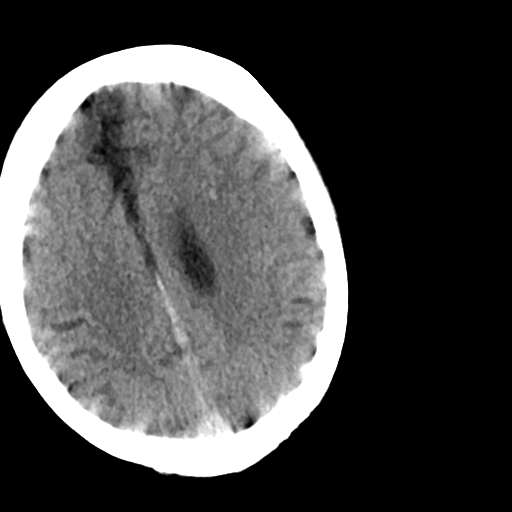
[im 26/31  brain]
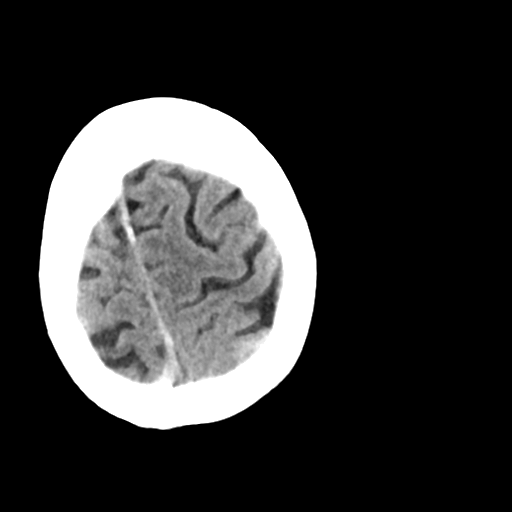
[im 26/31  bone]
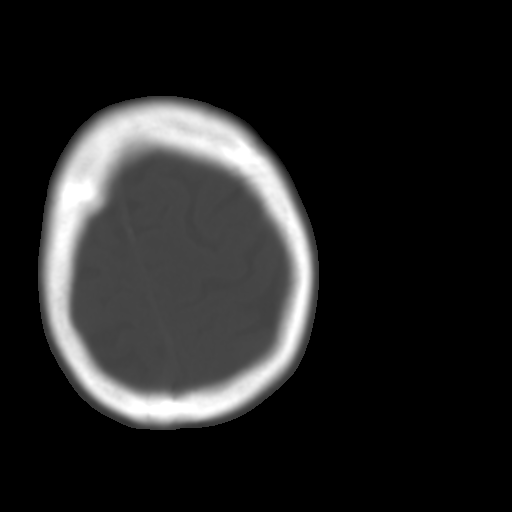

[Series 4: head wo · axial · 0.41mm/px · z∈[-110,-10]mm · 5 of 31 slices shown (2 of 2)]
[im 6/31  brain]
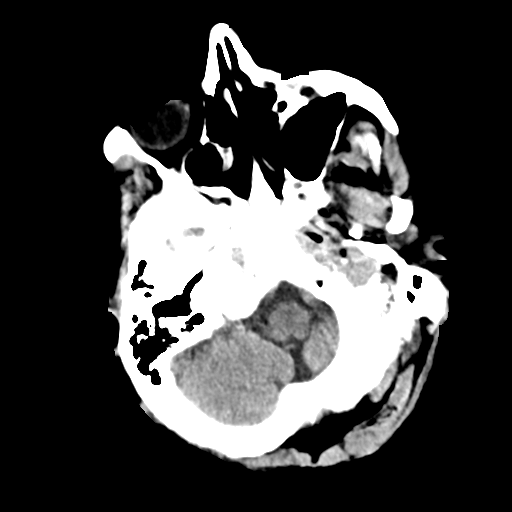
[im 11/31  brain]
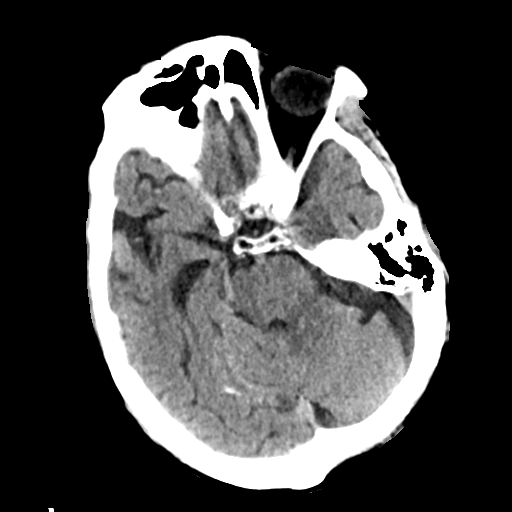
[im 16/31  brain]
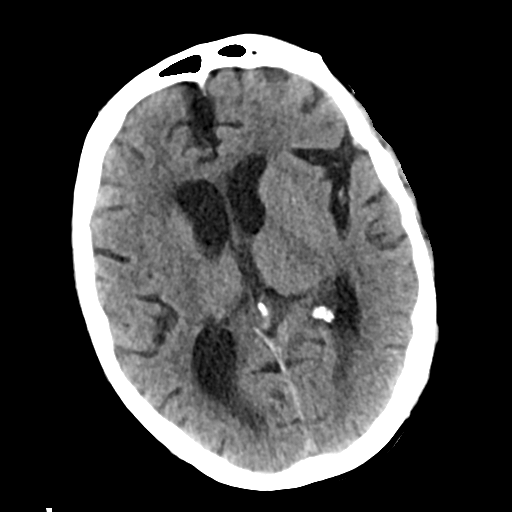
[im 21/31  brain]
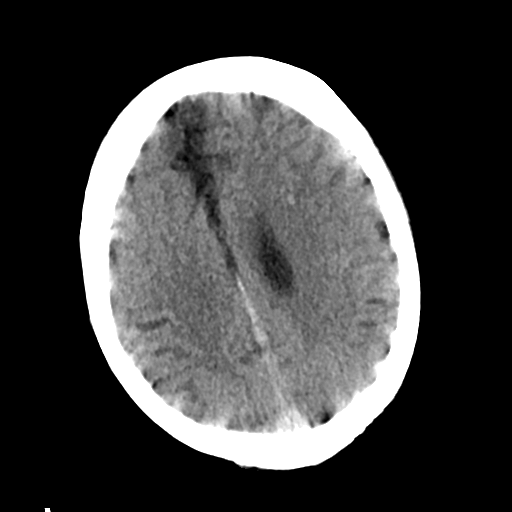
[im 26/31  brain]
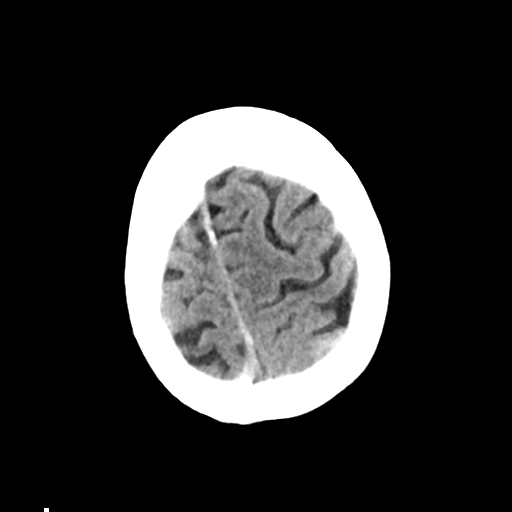

[Series 5: cor soft · coronal · 0.27mm/px · 3 of 70 slices shown]
[im 24/70  brain]
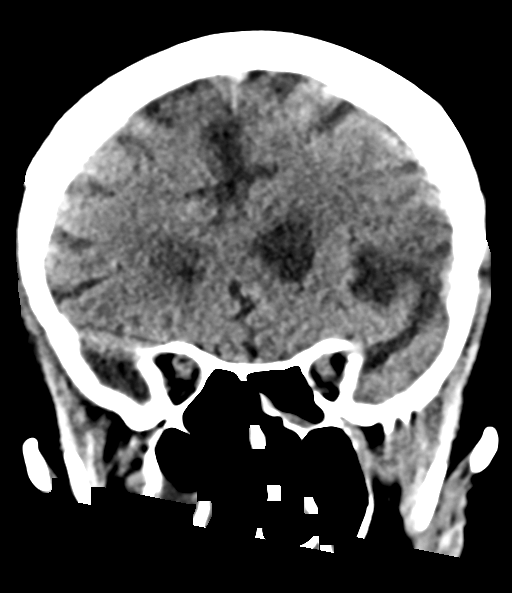
[im 31/70  brain]
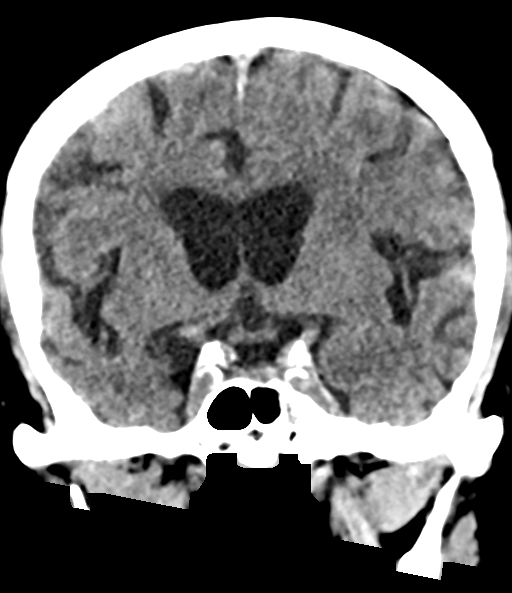
[im 39/70  brain]
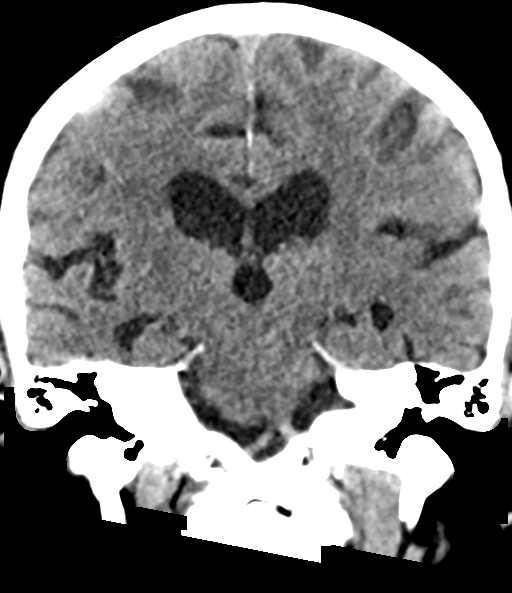

[Series 6: sag soft · sagittal · 0.32mm/px · 3 of 50 slices shown]
[im 20/50  brain]
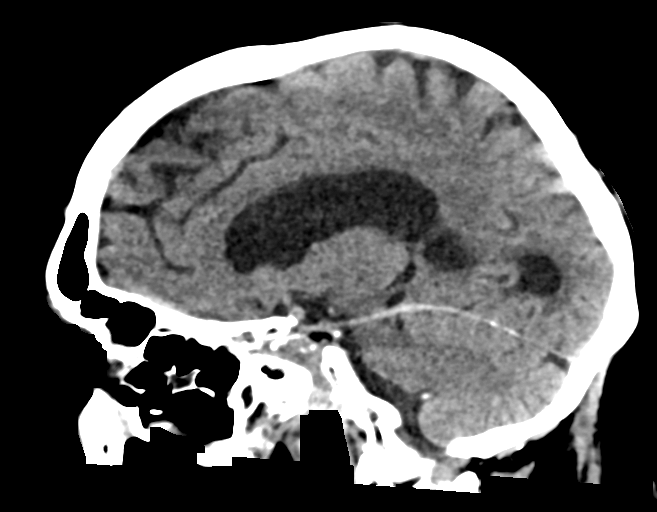
[im 25/50  brain]
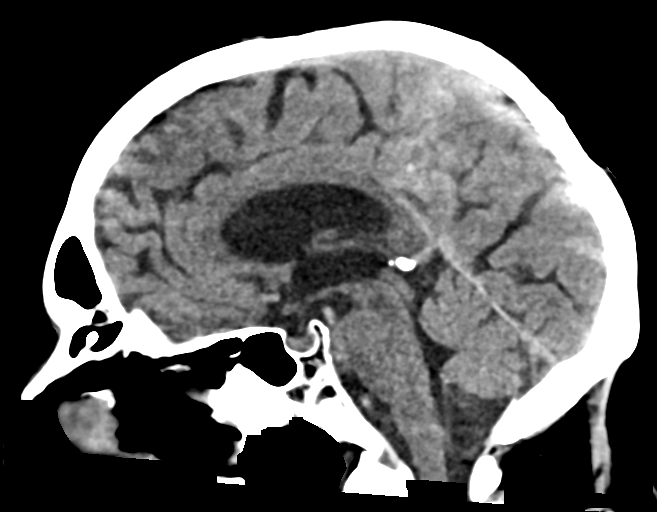
[im 31/50  brain]
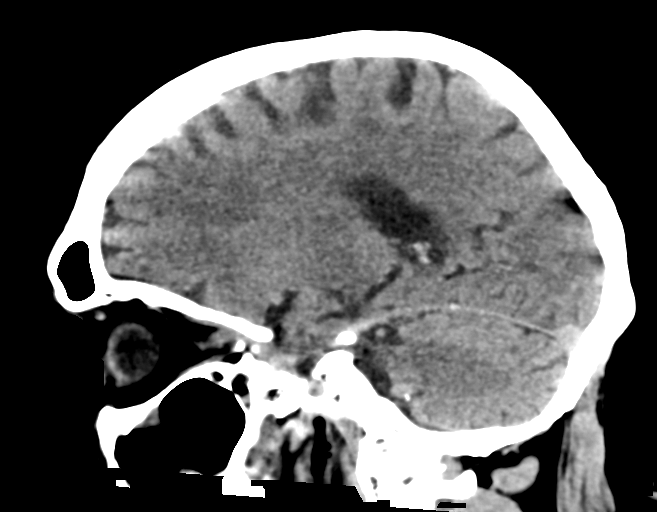

[16 of 47 positions shown; findings below may reference images not displayed]

FINDINGS: Brain: The study is mildly to moderately motion degraded,
particularly at and above the level of the lateral ventricles.
Within this limitation, no definite acute infarct, intracranial
hemorrhage, mass, midline shift, or extra-axial fluid collection is
identified. There is mild cerebral atrophy.

Vascular: Calcified atherosclerosis at the skull base. No hyperdense
vessel.

Skull: No fracture or suspicious osseous lesion.

Sinuses/Orbits: Mild mucosal thickening throughout the paranasal
sinuses. Clear mastoid air cells. Bilateral cataract extraction.

Other: None.
IMPRESSION: No acute intracranial abnormality identified on this motion degraded
study.
